# Patient Record
Sex: Female | Born: 1950 | Race: Black or African American | Hispanic: No | Smoking: Never smoker
Health system: Southern US, Community
[De-identification: ages and names within clinical notes are randomized; demographics above are authoritative.]

## PROBLEM LIST (undated history)

## (undated) DIAGNOSIS — I10 Essential (primary) hypertension: Secondary | ICD-10-CM

## (undated) DIAGNOSIS — R079 Chest pain, unspecified: Secondary | ICD-10-CM

## (undated) DIAGNOSIS — Z862 Personal history of diseases of the blood and blood-forming organs and certain disorders involving the immune mechanism: Secondary | ICD-10-CM

## (undated) DIAGNOSIS — J189 Pneumonia, unspecified organism: Secondary | ICD-10-CM

## (undated) DIAGNOSIS — J9601 Acute respiratory failure with hypoxia: Secondary | ICD-10-CM

## (undated) DIAGNOSIS — R001 Bradycardia, unspecified: Secondary | ICD-10-CM

## (undated) DIAGNOSIS — C9590 Leukemia, unspecified not having achieved remission: Secondary | ICD-10-CM

## (undated) DIAGNOSIS — U071 COVID-19: Secondary | ICD-10-CM

## (undated) HISTORY — PX: PARTIAL HYSTERECTOMY: SHX80

---

## 2014-11-15 DIAGNOSIS — D709 Neutropenia, unspecified: Secondary | ICD-10-CM | POA: Insufficient documentation

## 2015-04-08 DIAGNOSIS — M5430 Sciatica, unspecified side: Secondary | ICD-10-CM | POA: Insufficient documentation

## 2015-04-08 DIAGNOSIS — M5137 Other intervertebral disc degeneration, lumbosacral region: Secondary | ICD-10-CM | POA: Insufficient documentation

## 2015-04-08 DIAGNOSIS — D696 Thrombocytopenia, unspecified: Secondary | ICD-10-CM | POA: Insufficient documentation

## 2015-04-08 DIAGNOSIS — E559 Vitamin D deficiency, unspecified: Secondary | ICD-10-CM | POA: Insufficient documentation

## 2015-04-08 DIAGNOSIS — Z862 Personal history of diseases of the blood and blood-forming organs and certain disorders involving the immune mechanism: Secondary | ICD-10-CM | POA: Insufficient documentation

## 2015-04-08 DIAGNOSIS — R7301 Impaired fasting glucose: Secondary | ICD-10-CM | POA: Insufficient documentation

## 2015-04-08 DIAGNOSIS — I10 Essential (primary) hypertension: Secondary | ICD-10-CM | POA: Insufficient documentation

## 2015-04-08 DIAGNOSIS — M51379 Other intervertebral disc degeneration, lumbosacral region without mention of lumbar back pain or lower extremity pain: Secondary | ICD-10-CM | POA: Insufficient documentation

## 2015-04-08 DIAGNOSIS — M47817 Spondylosis without myelopathy or radiculopathy, lumbosacral region: Secondary | ICD-10-CM | POA: Insufficient documentation

## 2015-04-08 DIAGNOSIS — K760 Fatty (change of) liver, not elsewhere classified: Secondary | ICD-10-CM | POA: Insufficient documentation

## 2015-04-28 DIAGNOSIS — G8929 Other chronic pain: Secondary | ICD-10-CM | POA: Insufficient documentation

## 2016-08-19 ENCOUNTER — Encounter (HOSPITAL_COMMUNITY): Payer: Self-pay | Admitting: Emergency Medicine

## 2016-08-19 ENCOUNTER — Emergency Department (HOSPITAL_COMMUNITY)
Admission: EM | Admit: 2016-08-19 | Discharge: 2016-08-19 | Disposition: A | Discharge status: Home / Self Care | Payer: Self-pay | Attending: Emergency Medicine | Admitting: Emergency Medicine

## 2016-08-19 ENCOUNTER — Emergency Department (HOSPITAL_COMMUNITY): Payer: Self-pay

## 2016-08-19 DIAGNOSIS — N3000 Acute cystitis without hematuria: Secondary | ICD-10-CM | POA: Insufficient documentation

## 2016-08-19 DIAGNOSIS — R51 Headache: Secondary | ICD-10-CM

## 2016-08-19 DIAGNOSIS — R300 Dysuria: Secondary | ICD-10-CM | POA: Insufficient documentation

## 2016-08-19 DIAGNOSIS — R519 Headache, unspecified: Secondary | ICD-10-CM

## 2016-08-19 LAB — URINALYSIS, ROUTINE W REFLEX MICROSCOPIC
BACTERIA UA: NONE SEEN
BILIRUBIN URINE: NEGATIVE
Glucose, UA: NEGATIVE mg/dL
Hgb urine dipstick: NEGATIVE
KETONES UR: 5 mg/dL — AB
Nitrite: NEGATIVE
PH: 6 (ref 5.0–8.0)
Protein, ur: 30 mg/dL — AB
Specific Gravity, Urine: 1.024 (ref 1.005–1.030)

## 2016-08-19 LAB — I-STAT CHEM 8, ED
BUN: 11 mg/dL (ref 6–20)
CREATININE: 0.7 mg/dL (ref 0.44–1.00)
Calcium, Ion: 1.04 mmol/L — ABNORMAL LOW (ref 1.15–1.40)
Chloride: 99 mmol/L — ABNORMAL LOW (ref 101–111)
Glucose, Bld: 113 mg/dL — ABNORMAL HIGH (ref 65–99)
HEMATOCRIT: 42 % (ref 36.0–46.0)
Hemoglobin: 14.3 g/dL (ref 12.0–15.0)
Potassium: 3.9 mmol/L (ref 3.5–5.1)
Sodium: 138 mmol/L (ref 135–145)
TCO2: 25 mmol/L (ref 0–100)

## 2016-08-19 MED ORDER — METOCLOPRAMIDE HCL 5 MG/ML IJ SOLN
10.0000 mg | Freq: Once | INTRAMUSCULAR | Status: AC
Start: 2016-08-19 — End: 2016-08-19
  Administered 2016-08-19: 10 mg via INTRAVENOUS
  Filled 2016-08-19: qty 2

## 2016-08-19 MED ORDER — DEXTROSE 5 % IV SOLN
1.0000 g | Freq: Once | INTRAVENOUS | Status: AC
  Administered 2016-08-19: 1 g via INTRAVENOUS
  Filled 2016-08-19: qty 10

## 2016-08-19 MED ORDER — SODIUM CHLORIDE 0.9 % IV BOLUS (SEPSIS)
1000.0000 mL | Freq: Once | INTRAVENOUS | Status: AC
  Administered 2016-08-19: 1000 mL via INTRAVENOUS

## 2016-08-19 MED ORDER — SULFAMETHOXAZOLE-TRIMETHOPRIM 800-160 MG PO TABS: 1.0000 | ORAL_TABLET | Freq: Two times a day (BID) | ORAL | 0 refills | Status: AC

## 2016-08-19 MED ORDER — KETOROLAC TROMETHAMINE 15 MG/ML IJ SOLN
15.0000 mg | Freq: Once | INTRAMUSCULAR | Status: AC
  Administered 2016-08-19: 15 mg via INTRAVENOUS
  Filled 2016-08-19: qty 1

## 2016-08-19 MED ORDER — KETOROLAC TROMETHAMINE 30 MG/ML IJ SOLN: 30.0000 mg | Freq: Once | INTRAMUSCULAR | Status: DC

## 2016-08-19 MED ORDER — DIPHENHYDRAMINE HCL 50 MG/ML IJ SOLN
12.5000 mg | Freq: Once | INTRAMUSCULAR | Status: AC
  Administered 2016-08-19: 12.5 mg via INTRAVENOUS
  Filled 2016-08-19: qty 1

## 2016-08-19 MED ORDER — MORPHINE SULFATE (PF) 4 MG/ML IV SOLN
2.0000 mg | Freq: Once | INTRAVENOUS | Status: AC
  Administered 2016-08-19: 2 mg via INTRAVENOUS
  Filled 2016-08-19: qty 1

## 2016-08-19 NOTE — ED Notes (Addendum)
Pt wants to ambulate to BR before getting medications.  ED Tech helping pt to BR now.  Pt was dizzy upon standing.  This RN drawing up meds to expedite process.

## 2016-08-19 NOTE — ED Notes (Signed)
Pt presents with 3 day hx of headache ("sharp pains"), nausea, weakness.  Pt sts no hx of migraines; her doctor said this was allergies.

## 2016-08-19 NOTE — ED Provider Notes (Signed)
[facility] DEPT Provider Note   CSN: [id number] Arrival date & time: 08/19/16  0405     History   Chief Complaint Chief Complaint  Patient presents with  . Migraine    HPI Brandy Stewart is a 66 y.o. female.  Patient presents with headache that started 3 days ago and described as left side of head that is constant with intermittent sharp shooting pain, "like I'm being stabbed". No photophobia, nausea, vomiting. No aggravating or alleviating factors. She denies history of migraine headache. She also complains of urinary frequency with dysuria. No vaginal discharge, pelvic pain or low back discomfort.    The history is provided by the patient. No language interpreter was used.  Migraine  Associated symptoms include headaches. Pertinent negatives include no chest pain, no abdominal pain and no shortness of breath.    History reviewed. No pertinent past medical history.  There are no active problems to display for this patient.   History reviewed. No pertinent surgical history.  OB History    No data available       Home Medications    Prior to Admission medications   Not on File    Family History No family history on file.  Social History Social History  Substance Use Topics  . Smoking status: Not on file  . Smokeless tobacco: Never Used  . Alcohol use No     Allergies   Penicillins   Review of Systems Review of Systems  Constitutional: Negative for chills and fever.  HENT: Negative.  Negative for congestion and sore throat.   Eyes: Negative for photophobia and visual disturbance.  Respiratory: Negative.  Negative for cough and shortness of breath.   Cardiovascular: Negative.  Negative for chest pain.  Gastrointestinal: Negative.  Negative for abdominal pain, nausea and vomiting.  Genitourinary: Positive for dysuria and frequency. Negative for pelvic pain.  Musculoskeletal: Negative.  Negative for back pain and myalgias.  Skin: Negative.     Neurological: Positive for headaches. Negative for facial asymmetry, speech difficulty, weakness and numbness.     Physical Exam Updated Vital Signs BP 127/63 (BP Location: Right Arm)   Pulse (!) 101   Temp 99.8 F (37.7 C) (Oral)   Resp 16   SpO2 97%   Physical Exam  Constitutional: She is oriented to person, place, and time. She appears well-developed and well-nourished.  HENT:  Head: Normocephalic.  Eyes: Pupils are equal, round, and reactive to light.  Neck: Normal range of motion. Neck supple.  Cardiovascular: Normal rate and regular rhythm.   Pulmonary/Chest: Effort normal and breath sounds normal.  Abdominal: Soft. Bowel sounds are normal. There is no tenderness. There is no rebound and no guarding.  Musculoskeletal: Normal range of motion.  Neurological: She is alert and oriented to person, place, and time. She has normal strength and normal reflexes. No sensory deficit. She displays a negative Romberg sign. Coordination normal.  CN's 3-12 grossly intact. Speech is clear and focused. No facial asymmetry. No lateralizing weakness. Reflexes are equal. No deficits of coordination. Ambulatory without imbalance.    Skin: Skin is warm and dry. No rash noted.  Psychiatric: She has a normal mood and affect.     ED Treatments / Results  Labs (all labs ordered are listed, but only abnormal results are displayed) Labs Reviewed  URINALYSIS, ROUTINE W REFLEX MICROSCOPIC - Abnormal; Notable for the following:       Result Value   APPearance HAZY (*)    Ketones, ur 5 (*)  Protein, ur 30 (*)    Leukocytes, UA LARGE (*)    Squamous Epithelial / LPF 0-5 (*)    All other components within normal limits  I-STAT CHEM 8, ED - Abnormal; Notable for the following:    Chloride 99 (*)    Glucose, Bld 113 (*)    Calcium, Ion 1.04 (*)    All other components within normal limits    EKG  EKG Interpretation None       Radiology No results found.  Procedures Procedures  (including critical care time)  Medications Ordered in ED Medications  metoCLOPramide (REGLAN) injection 10 mg (not administered)  diphenhydrAMINE (BENADRYL) injection 12.5 mg (not administered)  ketorolac (TORADOL) 15 MG/ML injection 15 mg (not administered)     Initial Impression / Assessment and Plan / ED Course  I have reviewed the triage vital signs and the nursing notes.  Pertinent labs & imaging results that were available during my care of the patient were reviewed by me and considered in my medical decision making (see chart for details).     Patient with complaint of headache. Constant dull headache with sharp shooting pain intermittently. Location to left side of head that hasn't changed. Completely normal neurologic exam. Symptoms x 3 days. Doubt intracranial process.   She also complains of urinary frequency and dysuria and is found to have a UTI. Will start on SeptraDS x 5 days. Will provide pyridium.   She will need re-evaluation after medications are provided to determine disposition. Anticipate discharge home.   Patient care signed out to oncoming provider for final disposition.   Final Clinical Impressions(s) / ED Diagnoses   Final diagnoses:  None   1. Nonspecific headache 2. UTI  New Prescriptions New Prescriptions   No medications on file     Charlann Lange, Hershal Coria 08/19/16 0131    Orpah Greek, MD 08/19/16 [phone]

## 2016-08-19 NOTE — ED Triage Notes (Signed)
Patient with three day history of headache.  Patient states that she is having some photophobia and noise sensitivity.  She does have some nausea, no vomiting.  She states she is having some burning with urination.  APAP has not helped her migraine.

## 2016-08-19 NOTE — ED Provider Notes (Signed)
PROGRESS NOTE                                                                                                                 This is a sign-out from [facility] at shift change: Chrishawn Kring is a 66 y.o. female presenting with UTI symptoms and headache onset 3 days ago. Patient does not typically have headaches. She has a normal neurologic exam. She's tried acetaminophen over-the-counter with little relief. No red flags. Because the atypical nature of her headache patient will receive CT. Plan is to DC home with Bactrim for UTI pending. Please refer to previous note for full HPI, ROS, PMH and PE.   Head CT negative.  Patient seen and evaluated the bedside, she states that her headache is improved but is still persistent. LSCTA b/l; Heart is RRR; Abd without TTP, guarding or rebound, MAE, goal oriented speech. Follows commands, Clear, goal oriented speech, Strength is 5 out of 5x4 extremities, patient ambulates with a coordinated in nonantalgic gait. Sensation is grossly intact.  Patient given fluid bolus and Rocephin added on urine culture.  After morphine headache is improved. She will follow closely with primary care which had an extensive discussion of return precautions.       Waynetta Pean 08/19/16 1884    Rolland Porter, MD 08/19/16 2258

## 2016-08-20 ENCOUNTER — Encounter (HOSPITAL_COMMUNITY): Payer: Self-pay | Admitting: Emergency Medicine

## 2016-08-20 ENCOUNTER — Emergency Department (HOSPITAL_COMMUNITY)
Admission: EM | Admit: 2016-08-20 | Discharge: 2016-08-20 | Disposition: A | Discharge status: Home / Self Care | Payer: Self-pay | Attending: Emergency Medicine | Admitting: Emergency Medicine

## 2016-08-20 DIAGNOSIS — R519 Headache, unspecified: Secondary | ICD-10-CM

## 2016-08-20 DIAGNOSIS — N390 Urinary tract infection, site not specified: Secondary | ICD-10-CM | POA: Insufficient documentation

## 2016-08-20 DIAGNOSIS — R509 Fever, unspecified: Secondary | ICD-10-CM | POA: Insufficient documentation

## 2016-08-20 DIAGNOSIS — R51 Headache: Secondary | ICD-10-CM | POA: Insufficient documentation

## 2016-08-20 LAB — CSF CELL COUNT WITH DIFFERENTIAL
RBC COUNT CSF: 0 /mm3
RBC COUNT CSF: 13 /mm3 — AB
TUBE #: 1
TUBE #: 4
WBC, CSF: 7 /mm3 — ABNORMAL HIGH (ref 0–5)
WBC, CSF: 9 /mm3 — ABNORMAL HIGH (ref 0–5)

## 2016-08-20 LAB — URINALYSIS, ROUTINE W REFLEX MICROSCOPIC
BILIRUBIN URINE: NEGATIVE
Glucose, UA: NEGATIVE mg/dL
KETONES UR: 5 mg/dL — AB
NITRITE: NEGATIVE
PROTEIN: 30 mg/dL — AB
Specific Gravity, Urine: 1.008 (ref 1.005–1.030)
pH: 6 (ref 5.0–8.0)

## 2016-08-20 LAB — COMPREHENSIVE METABOLIC PANEL
ALBUMIN: 3.4 g/dL — AB (ref 3.5–5.0)
ALT: 85 U/L — ABNORMAL HIGH (ref 14–54)
ANION GAP: 10 (ref 5–15)
AST: 211 U/L — ABNORMAL HIGH (ref 15–41)
Alkaline Phosphatase: 79 U/L (ref 38–126)
BILIRUBIN TOTAL: 0.4 mg/dL (ref 0.3–1.2)
BUN: 7 mg/dL (ref 6–20)
CO2: 22 mmol/L (ref 22–32)
Calcium: 8.6 mg/dL — ABNORMAL LOW (ref 8.9–10.3)
Chloride: 100 mmol/L — ABNORMAL LOW (ref 101–111)
Creatinine, Ser: 0.83 mg/dL (ref 0.44–1.00)
GFR calc non Af Amer: >60 mL/min (ref >60)
GLUCOSE: 119 mg/dL — AB (ref 65–99)
POTASSIUM: 3.5 mmol/L (ref 3.5–5.1)
SODIUM: 132 mmol/L — AB (ref 135–145)
TOTAL PROTEIN: 8.1 g/dL (ref 6.5–8.1)

## 2016-08-20 LAB — CBC WITH DIFFERENTIAL/PLATELET
BASOS ABS: 0 10*3/uL (ref 0.0–0.1)
BASOS PCT: 0 %
Eosinophils Absolute: 0 10*3/uL (ref 0.0–0.7)
Eosinophils Relative: 0 %
HEMATOCRIT: 38.5 % (ref 36.0–46.0)
HEMOGLOBIN: 12.6 g/dL (ref 12.0–15.0)
Lymphocytes Relative: 17 %
Lymphs Abs: 0.9 10*3/uL (ref 0.7–4.0)
MCH: 29.5 pg (ref 26.0–34.0)
MCHC: 32.7 g/dL (ref 30.0–36.0)
MCV: 90.2 fL (ref 78.0–100.0)
MONO ABS: 0.3 10*3/uL (ref 0.1–1.0)
Monocytes Relative: 6 %
NEUTROS ABS: 3.9 10*3/uL (ref 1.7–7.7)
NEUTROS PCT: 77 %
Platelets: 149 10*3/uL — ABNORMAL LOW (ref 150–400)
RBC: 4.27 MIL/uL (ref 3.87–5.11)
RDW: 12.7 % (ref 11.5–15.5)
WBC: 5.1 10*3/uL (ref 4.0–10.5)

## 2016-08-20 LAB — GLUCOSE, CSF: Glucose, CSF: 65 mg/dL (ref 40–70)

## 2016-08-20 LAB — URINE CULTURE

## 2016-08-20 LAB — I-STAT CG4 LACTIC ACID, ED
Lactic Acid, Venous: 0.81 mmol/L (ref 0.5–1.9)
Lactic Acid, Venous: 0.71 mmol/L (ref 0.5–1.9)

## 2016-08-20 LAB — HIV ANTIBODY (ROUTINE TESTING W REFLEX): HIV SCREEN 4TH GENERATION: NONREACTIVE

## 2016-08-20 LAB — PROTEIN, CSF: TOTAL PROTEIN, CSF: 23 mg/dL (ref 15–45)

## 2016-08-20 MED ORDER — DIPHENHYDRAMINE HCL 50 MG/ML IJ SOLN
25.0000 mg | Freq: Once | INTRAMUSCULAR | Status: AC
  Administered 2016-08-20: 25 mg via INTRAVENOUS
  Filled 2016-08-20: qty 1

## 2016-08-20 MED ORDER — TRAMADOL HCL 50 MG PO TABS: 50.0000 mg | ORAL_TABLET | Freq: Four times a day (QID) | ORAL | 0 refills | Status: DC | PRN

## 2016-08-20 MED ORDER — DEXTROSE 5 % IV SOLN
1.0000 g | Freq: Once | INTRAVENOUS | Status: AC
  Administered 2016-08-20: 1 g via INTRAVENOUS
  Filled 2016-08-20: qty 10

## 2016-08-20 MED ORDER — LIDOCAINE HCL (PF) 1 % IJ SOLN
INTRAMUSCULAR | Status: AC
  Administered 2016-08-20: 30 mL
  Filled 2016-08-20: qty 30

## 2016-08-20 MED ORDER — HYDROMORPHONE HCL 1 MG/ML IJ SOLN
1.0000 mg | Freq: Once | INTRAMUSCULAR | Status: AC
  Administered 2016-08-20: 1 mg via INTRAVENOUS
  Filled 2016-08-20: qty 1

## 2016-08-20 MED ORDER — SODIUM CHLORIDE 0.9 % IV BOLUS (SEPSIS)
500.0000 mL | Freq: Once | INTRAVENOUS | Status: AC
  Administered 2016-08-20: 500 mL via INTRAVENOUS

## 2016-08-20 MED ORDER — PROCHLORPERAZINE EDISYLATE 5 MG/ML IJ SOLN
10.0000 mg | Freq: Once | INTRAMUSCULAR | Status: AC
Start: 2016-08-20 — End: 2016-08-20
  Administered 2016-08-20: 10 mg via INTRAVENOUS
  Filled 2016-08-20: qty 2

## 2016-08-20 MED ORDER — ACETAMINOPHEN 325 MG PO TABS
ORAL_TABLET | ORAL | Status: AC
  Filled 2016-08-20: qty 2

## 2016-08-20 MED ORDER — METOCLOPRAMIDE HCL 5 MG/ML IJ SOLN
10.0000 mg | Freq: Once | INTRAMUSCULAR | Status: AC
  Administered 2016-08-20: 10 mg via INTRAVENOUS
  Filled 2016-08-20: qty 2

## 2016-08-20 MED ORDER — KETOROLAC TROMETHAMINE 30 MG/ML IJ SOLN
15.0000 mg | Freq: Once | INTRAMUSCULAR | Status: AC
  Administered 2016-08-20: 15 mg via INTRAVENOUS
  Filled 2016-08-20: qty 1

## 2016-08-20 MED ORDER — ACETAMINOPHEN 325 MG PO TABS
650.0000 mg | ORAL_TABLET | Freq: Once | ORAL | Status: AC | PRN
  Administered 2016-08-20: 650 mg via ORAL

## 2016-08-20 NOTE — ED Triage Notes (Signed)
Pt to ED from home c/o continued headache on L side (seen and treated here yesterday for same). Today, pt presents with nausea, fever, light sensitivity, and neck pain with movement. Pt reports being sent home with Bactrim yesterday, but her headache was never really treated.

## 2016-08-20 NOTE — ED Provider Notes (Signed)
[facility] DEPT Provider Note   CSN: [id number] Arrival date & time: 08/20/16  0033  By signing my name below, I, Ny'Kea Lewis, attest that this documentation has been prepared under the direction and in the presence of Pollina, Gwenyth Allegra, *. Electronically Signed: Lise Auer, ED Scribe. 08/20/16. 2:31 AM.  History   Chief Complaint Chief Complaint  Patient presents with  . Headache  . Fever   The history is provided by the patient. No language interpreter was used.    HPI Comments: Brandy Stewart is a 66 y.o. female with no pertinent history. who presents to the Emergency Department complaining of intermittent, gradually worsening left sided sharp headache that began yesterday. She notes associated fever, neck pain and stiffness, and nausea. She was seen in the ED yesterday with the complaint of headache but reports the other symptoms are new onsets. Yesterday she was diagnosed with an UTI and given Bactrim. At the time of discharge, there was no change in headache. No treatment tried PTA. She denies symptoms of dysarthria, sore throat, cough, congestion, weakness, numbness, or tingling.   History reviewed. No pertinent past medical history.  There are no active problems to display for this patient.  History reviewed. No pertinent surgical history.  OB History    No data available     Home Medications    Prior to Admission medications   Medication Sig Start Date End Date Taking? Authorizing Provider  acetaminophen (TYLENOL) 500 MG tablet Take 1,000 mg by mouth every 6 (six) hours as needed for mild pain.   Yes [provider]  sulfamethoxazole-trimethoprim (BACTRIM DS,SEPTRA DS) 800-160 MG tablet Take 1 tablet by mouth 2 (two) times daily. 08/19/16 08/24/16 Yes Charlann Lange, PA-C   Family History No family history on file.  Social History Social History  Substance Use Topics  . Smoking status: Never Smoker  . Smokeless tobacco: Never Used  . Alcohol use  No   Allergies   Penicillins  Review of Systems Review of Systems  Constitutional: Positive for fever.  HENT: Negative for congestion and sore throat.        Denies dysarthria.   Respiratory: Negative for cough.   Gastrointestinal: Positive for nausea.  Musculoskeletal: Positive for neck pain and neck stiffness.  Neurological: Positive for headaches. Negative for weakness and numbness.  All other systems reviewed and are negative.  Physical Exam Updated Vital Signs BP 130/62   Pulse 76   Temp (!) 102.9 F (39.4 C) (Oral)   Resp 18   SpO2 99%   Physical Exam  Constitutional: She is oriented to person, place, and time. She appears well-developed and well-nourished.  HENT:  Head: Normocephalic and atraumatic.  Right Ear: External ear normal.  Left Ear: External ear normal.  Nose: Nose normal.  Eyes: Right eye exhibits no discharge. Left eye exhibits no discharge.  Neck:  Decreased ROM with posterior tenderness.   Cardiovascular: Normal rate, regular rhythm and normal heart sounds.   Pulmonary/Chest: Effort normal and breath sounds normal.  Abdominal: Soft. There is no tenderness.  Neurological: She is alert and oriented to person, place, and time.  Skin: Skin is warm and dry.  Nursing note and vitals reviewed.  ED Treatments / Results  DIAGNOSTIC STUDIES: Oxygen Saturation is 98% on RA, normal by my interpretation.   COORDINATION OF CARE: 2:21 AM-Discussed next steps with pt. Pt verbalized understanding and is agreeable with the plan.   Labs (all labs ordered are listed, but only abnormal results are displayed)  Labs Reviewed  COMPREHENSIVE METABOLIC PANEL - Abnormal; Notable for the following:       Result Value   Sodium 132 (*)    Chloride 100 (*)    Glucose, Bld 119 (*)    Calcium 8.6 (*)    Albumin 3.4 (*)    AST 211 (*)    ALT 85 (*)    All other components within normal limits  CBC WITH DIFFERENTIAL/PLATELET - Abnormal; Notable for the following:     Platelets 149 (*)    All other components within normal limits  URINALYSIS, ROUTINE W REFLEX MICROSCOPIC - Abnormal; Notable for the following:    APPearance HAZY (*)    Hgb urine dipstick LARGE (*)    Ketones, ur 5 (*)    Protein, ur 30 (*)    Leukocytes, UA LARGE (*)    Bacteria, UA RARE (*)    Squamous Epithelial / LPF 0-5 (*)    All other components within normal limits  CSF CELL COUNT WITH DIFFERENTIAL - Abnormal; Notable for the following:    RBC Count, CSF 13 (*)    WBC, CSF 9 (*)    All other components within normal limits  CSF CELL COUNT WITH DIFFERENTIAL - Abnormal; Notable for the following:    WBC, CSF 7 (*)    All other components within normal limits  CSF CULTURE  CULTURE, BLOOD (ROUTINE X 2)  CULTURE, BLOOD (ROUTINE X 2)  GLUCOSE, CSF  PROTEIN, CSF  HERPES SIMPLEX VIRUS(HSV) DNA BY PCR  ARBOVIRUS IGG, CSF  HIV ANTIBODY (ROUTINE TESTING)  I-STAT CG4 LACTIC ACID, ED  I-STAT CG4 LACTIC ACID, ED   EKG  EKG Interpretation None      Radiology Ct Head Wo Contrast  Result Date: 08/19/2016 CLINICAL DATA:  66 year old female with headache for 3 days, sensitivity to light and sound. EXAM: CT HEAD WITHOUT CONTRAST TECHNIQUE: Contiguous axial images were obtained from the base of the skull through the vertex without intravenous contrast. COMPARISON:  [facility] neck CT 07/08/2009. FINDINGS: Brain: Cerebral volume is within normal limits for age. No midline shift, ventriculomegaly, mass effect, evidence of mass lesion, intracranial hemorrhage or evidence of cortically based acute infarction. Gray-white matter differentiation is within normal limits throughout the brain. No encephalomalacia identified. Vascular: Calcified atherosclerosis at the skull base. No suspicious intracranial vascular hyperdensity. Skull: No acute osseous abnormality identified. Sinuses/Orbits: Unchanged small left maxillary sinus mucous retention cyst. Otherwise clear. Bilateral  tympanic cavities and mastoids are clear. Other: Visualized orbit soft tissues are within normal limits. Visualized scalp soft tissues are within normal limits. IMPRESSION: Normal for age non contrast CT appearance of the brain. Electronically Signed   By: Genevie Ann M.D.   On: 08/19/2016 07:32    Procedures .Lumbar Puncture Date/Time: 08/20/2016 6:42 AM Performed by: Orpah Greek Authorized by: Orpah Greek   Consent:    Consent obtained:  Verbal   Consent given by:  Patient   Risks discussed:  Headache, pain and repeat procedure Universal protocol:    Procedure explained and questions answered to patient or proxy's satisfaction: yes     Relevant documents present and verified: yes     Test results available and properly labeled: yes     Imaging studies available: yes     Required blood products, implants, devices, and special equipment available: yes     Immediately prior to procedure a time out was called: yes     Site/side marked: yes  Patient identity confirmed:  Verbally with patient and hospital-assigned identification number Pre-procedure details:    Procedure purpose:  Diagnostic   Preparation: Patient was prepped and draped in usual sterile fashion   Anesthesia (see MAR for exact dosages):    Anesthesia method:  Local infiltration   Local anesthetic:  Lidocaine 1% w/o epi Procedure details:    Lumbar space:  L3-L4 interspace   Patient position:  Sitting   Needle gauge:  20   Needle type:  Spinal needle - Quincke tip   Needle length (in):  3.5   Number of attempts:  2   Opening pressure (cm H2O):  29 (seated position)   Fluid appearance:  Clear   Tubes of fluid:  4   Total volume (ml):  8 Post-procedure:    Puncture site:  Adhesive bandage applied   Patient tolerance of procedure:  Tolerated well, no immediate complications Comments:     Initially attempted left lateral decubitus position but was unable to find interspace, stopped procedure and  restarted in seated position with no difficulty.   (including critical care time)  Medications Ordered in ED Medications  cefTRIAXone (ROCEPHIN) 1 g in dextrose 5 % 50 mL IVPB (not administered)  acetaminophen (TYLENOL) tablet 650 mg (650 mg Oral Given 08/20/16 0046)  sodium chloride 0.9 % bolus 500 mL (0 mLs Intravenous Stopped 08/20/16 0354)  HYDROmorphone (DILAUDID) injection 1 mg (1 mg Intravenous Given 08/20/16 0305)  metoCLOPramide (REGLAN) injection 10 mg (10 mg Intravenous Given 08/20/16 0305)  lidocaine (PF) (XYLOCAINE) 1 % injection (30 mLs  Given by Other 08/20/16 0354)  prochlorperazine (COMPAZINE) injection 10 mg (10 mg Intravenous Given 08/20/16 0654)  diphenhydrAMINE (BENADRYL) injection 25 mg (25 mg Intravenous Given 08/20/16 0654)  ketorolac (TORADOL) 30 MG/ML injection 15 mg (15 mg Intravenous Given 08/20/16 0654)   Initial Impression / Assessment and Plan / ED Course  I have reviewed the triage vital signs and the nursing notes.  Pertinent labs & imaging results that were available during my care of the patient were reviewed by me and considered in my medical decision making (see chart for details).     Patient presents to the emergency department for persistent headache. Patient seen yesterday with headache, had a normal CT scan. She was diagnosed with urinary tract infection, sent home on Bactrim. Patient experiencing increasing left-sided headache tonight. She is noted to have fever in triage. She complains of neck stiffness. Examination did reveal some tenderness of the soft tissues but no obvious meningismus signs. Negative Kernig, negative Brudzinski. Because of her persistent headache symptoms, fever and neck pain, I recommended lumbar puncture. After discussing risks and benefits with her and her husband consent was given. Procedure was performed. Patient has actually no signs of meningitis on CSF evaluation. Gram stain revealed no organisms. Protein and glucose normal. No  significant white or red cells. Patient reassured, fever unrelated to headache, or simply secondary to headache without any meningitis, including viral.   Urinalysis still shows too numerous to count white blood cells. Urine culture from yesterday is still pending. Will repeat Rocephin dose, continue Bactrim. Follow culture. Patient will not require hospitalization or further intervention at this time.  Final Clinical Impressions(s) / ED Diagnoses   Final diagnoses:  Bad headache  Fever, unspecified fever cause  Urinary tract infection without hematuria, site unspecified    New Prescriptions New Prescriptions   No medications on file  I personally performed the services described in this documentation, which was scribed in  my presence. The recorded information has been reviewed and is accurate.    Orpah Greek, MD 08/20/16 [phone]

## 2016-08-21 LAB — HERPES SIMPLEX VIRUS(HSV) DNA BY PCR
HSV 1 DNA: NEGATIVE
HSV 2 DNA: NEGATIVE

## 2016-08-23 ENCOUNTER — Encounter (HOSPITAL_COMMUNITY): Payer: Self-pay | Admitting: Emergency Medicine

## 2016-08-23 ENCOUNTER — Emergency Department (HOSPITAL_COMMUNITY)
Admission: EM | Admit: 2016-08-23 | Discharge: 2016-08-23 | Disposition: A | Discharge status: Home / Self Care | Payer: Self-pay | Attending: Emergency Medicine | Admitting: Emergency Medicine

## 2016-08-23 ENCOUNTER — Emergency Department (HOSPITAL_COMMUNITY): Payer: Self-pay

## 2016-08-23 DIAGNOSIS — Z880 Allergy status to penicillin: Secondary | ICD-10-CM | POA: Insufficient documentation

## 2016-08-23 DIAGNOSIS — N3000 Acute cystitis without hematuria: Secondary | ICD-10-CM | POA: Insufficient documentation

## 2016-08-23 DIAGNOSIS — B084 Enteroviral vesicular stomatitis with exanthem: Secondary | ICD-10-CM | POA: Insufficient documentation

## 2016-08-23 DIAGNOSIS — R197 Diarrhea, unspecified: Secondary | ICD-10-CM | POA: Insufficient documentation

## 2016-08-23 DIAGNOSIS — E860 Dehydration: Secondary | ICD-10-CM | POA: Insufficient documentation

## 2016-08-23 DIAGNOSIS — K2900 Acute gastritis without bleeding: Secondary | ICD-10-CM | POA: Insufficient documentation

## 2016-08-23 DIAGNOSIS — Z79899 Other long term (current) drug therapy: Secondary | ICD-10-CM | POA: Insufficient documentation

## 2016-08-23 LAB — CBC
HEMATOCRIT: 39.7 % (ref 36.0–46.0)
Hemoglobin: 13.2 g/dL (ref 12.0–15.0)
MCH: 29.5 pg (ref 26.0–34.0)
MCHC: 33.2 g/dL (ref 30.0–36.0)
MCV: 88.6 fL (ref 78.0–100.0)
PLATELETS: 156 10*3/uL (ref 150–400)
RBC: 4.48 MIL/uL (ref 3.87–5.11)
RDW: 12.7 % (ref 11.5–15.5)
WBC: 4.4 10*3/uL (ref 4.0–10.5)

## 2016-08-23 LAB — COMPREHENSIVE METABOLIC PANEL
ALBUMIN: 3.2 g/dL — AB (ref 3.5–5.0)
ALT: 73 U/L — AB (ref 14–54)
AST: 101 U/L — AB (ref 15–41)
Alkaline Phosphatase: 84 U/L (ref 38–126)
Anion gap: 10 (ref 5–15)
BUN: 9 mg/dL (ref 6–20)
CHLORIDE: 102 mmol/L (ref 101–111)
CO2: 21 mmol/L — AB (ref 22–32)
Calcium: 8.8 mg/dL — ABNORMAL LOW (ref 8.9–10.3)
Creatinine, Ser: 0.96 mg/dL (ref 0.44–1.00)
GFR calc Af Amer: >60 mL/min (ref >60)
GLUCOSE: 114 mg/dL — AB (ref 65–99)
Potassium: 3.8 mmol/L (ref 3.5–5.1)
Sodium: 133 mmol/L — ABNORMAL LOW (ref 135–145)
Total Bilirubin: 0.7 mg/dL (ref 0.3–1.2)
Total Protein: 7.5 g/dL (ref 6.5–8.1)

## 2016-08-23 LAB — URINALYSIS, ROUTINE W REFLEX MICROSCOPIC
Bilirubin Urine: NEGATIVE
Glucose, UA: NEGATIVE mg/dL
Ketones, ur: 20 mg/dL — AB
Nitrite: NEGATIVE
PROTEIN: 100 mg/dL — AB
SPECIFIC GRAVITY, URINE: 1.025 (ref 1.005–1.030)
pH: 6 (ref 5.0–8.0)

## 2016-08-23 LAB — LIPASE, BLOOD: LIPASE: 41 U/L (ref 11–51)

## 2016-08-23 MED ORDER — ONDANSETRON HCL 4 MG/2ML IJ SOLN
4.0000 mg | Freq: Once | INTRAMUSCULAR | Status: AC
Start: 2016-08-23 — End: 2016-08-23
  Administered 2016-08-23: 4 mg via INTRAVENOUS
  Filled 2016-08-23: qty 2

## 2016-08-23 MED ORDER — SODIUM CHLORIDE 0.9 % IV BOLUS (SEPSIS)
1000.0000 mL | Freq: Once | INTRAVENOUS | Status: AC
  Administered 2016-08-23: 1000 mL via INTRAVENOUS

## 2016-08-23 MED ORDER — LEVOFLOXACIN 500 MG PO TABS
500.0000 mg | ORAL_TABLET | Freq: Once | ORAL | Status: AC
  Administered 2016-08-23: 500 mg via ORAL
  Filled 2016-08-23: qty 1

## 2016-08-23 MED ORDER — RANITIDINE HCL 150 MG PO TABS
150.0000 mg | ORAL_TABLET | Freq: Two times a day (BID) | ORAL | 0 refills | Status: DC
Start: 2016-08-23 — End: 2016-10-21

## 2016-08-23 MED ORDER — ONDANSETRON 4 MG PO TBDP
8.0000 mg | ORAL_TABLET | Freq: Once | ORAL | Status: AC
  Administered 2016-08-23: 8 mg via ORAL

## 2016-08-23 MED ORDER — ONDANSETRON 4 MG PO TBDP: ORAL_TABLET | ORAL | 0 refills | Status: DC

## 2016-08-23 MED ORDER — LEVOFLOXACIN 500 MG PO TABS: 500.0000 mg | ORAL_TABLET | Freq: Every day | ORAL | 0 refills | Status: DC

## 2016-08-23 MED ORDER — ONDANSETRON 4 MG PO TBDP
ORAL_TABLET | ORAL | Status: AC
  Administered 2016-08-23: 8 mg via ORAL
  Filled 2016-08-23: qty 2

## 2016-08-23 MED ORDER — IOPAMIDOL (ISOVUE-300) INJECTION 61%
INTRAVENOUS | Status: AC
  Administered 2016-08-23: 75 mL via INTRAVENOUS
  Filled 2016-08-23: qty 100

## 2016-08-23 MED ORDER — PANTOPRAZOLE SODIUM 40 MG IV SOLR
40.0000 mg | Freq: Once | INTRAVENOUS | Status: AC
  Administered 2016-08-23: 40 mg via INTRAVENOUS
  Filled 2016-08-23: qty 40

## 2016-08-23 NOTE — ED Notes (Signed)
This RN attempted Iv stick x 1 unsuccessfully

## 2016-08-23 NOTE — ED Triage Notes (Signed)
Patient reports multiple emesis with diarrhea and sores inside her mouth / inner cheek for several days , pt. suspects side effects from her antibiotic Bactrim DS for yeast infection .Denies fever or chills / no abdominal pain .

## 2016-08-23 NOTE — ED Provider Notes (Signed)
[facility] DEPT Provider Note   CSN: [id number] Arrival date & time: 08/23/16  0128     History   Chief Complaint Chief Complaint  Patient presents with  . Emesis  . Diarrhea  . Stomatitis    HPI Brandy Stewart is a 66 y.o. female.  Patient complains of epigastric discomfort with vomiting and diarrhea.   The history is provided by the patient. No language interpreter was used.  Emesis   This is a new problem. The current episode started 12 to 24 hours ago. The problem occurs 2 to 4 times per day. The problem has not changed since onset.The emesis has an appearance of stomach contents. There has been no fever. Associated symptoms include abdominal pain. Pertinent negatives include no cough, no diarrhea and no headaches.  Abdominal Pain   This is a new problem. The current episode started 12 to 24 hours ago. The problem occurs constantly. The problem has not changed since onset.The pain is associated with an unknown factor. The pain is located in the epigastric region. The quality of the pain is aching. Pertinent negatives include diarrhea, frequency, hematuria and headaches.    History reviewed. No pertinent past medical history.  There are no active problems to display for this patient.   Past Surgical History:  Procedure Laterality Date  . PARTIAL HYSTERECTOMY      OB History    No data available       Home Medications    Prior to Admission medications   Medication Sig Start Date End Date Taking? Authorizing Provider  sulfamethoxazole-trimethoprim (BACTRIM DS,SEPTRA DS) 800-160 MG tablet Take 1 tablet by mouth 2 (two) times daily. 08/19/16 08/24/16 Yes Upstill, Nehemiah Settle, PA-C  acetaminophen (TYLENOL) 500 MG tablet Take 1,000 mg by mouth every 6 (six) hours as needed for mild pain.    [provider]  levofloxacin (LEVAQUIN) 500 MG tablet Take 1 tablet (500 mg total) by mouth daily. 08/23/16   Milton Ferguson, MD  ondansetron (ZOFRAN ODT) 4 MG disintegrating  tablet 4mg  ODT q4 hours prn nausea/vomit 08/23/16   Milton Ferguson, MD  ranitidine (ZANTAC) 150 MG tablet Take 1 tablet (150 mg total) by mouth 2 (two) times daily. 08/23/16   Milton Ferguson, MD  traMADol (ULTRAM) 50 MG tablet Take 1 tablet (50 mg total) by mouth every 6 (six) hours as needed. Patient not taking: Reported on 08/23/2016 08/20/16   Orpah Greek, MD    Family History No family history on file.  Social History Social History  Substance Use Topics  . Smoking status: Never Smoker  . Smokeless tobacco: Never Used  . Alcohol use No     Allergies   Penicillins   Review of Systems Review of Systems  Constitutional: Negative for appetite change and fatigue.  HENT: Negative for congestion, ear discharge and sinus pressure.   Eyes: Negative for discharge.  Respiratory: Negative for cough.   Cardiovascular: Negative for chest pain.  Gastrointestinal: Positive for abdominal pain. Negative for diarrhea.  Genitourinary: Negative for frequency and hematuria.  Musculoskeletal: Negative for back pain.  Skin: Negative for rash.  Neurological: Negative for seizures and headaches.  Psychiatric/Behavioral: Negative for hallucinations.     Physical Exam Updated Vital Signs BP (!) 112/53 (BP Location: Right Arm)   Pulse 82   Temp 98.6 F (37 C) (Oral)   Resp 16   Ht 5\' 6"  (1.676 m)   Wt 90.7 kg (200 lb)   SpO2 99%   BMI 32.28 kg/m  Physical Exam  Constitutional: She is oriented to person, place, and time. She appears well-developed.  HENT:  Head: Normocephalic.  Eyes: Conjunctivae and EOM are normal. No scleral icterus.  Neck: Neck supple. No thyromegaly present.  Cardiovascular: Normal rate and regular rhythm.  Exam reveals no gallop and no friction rub.   No murmur heard. Pulmonary/Chest: No stridor. She has no wheezes. She has no rales. She exhibits no tenderness.  Abdominal: She exhibits no distension. There is tenderness. There is no rebound.  Tender  epigastric  Genitourinary:  Genitourinary Comments: Patient has a small ulcer on the labia majora. She will get a herpes culture sent  Musculoskeletal: Normal range of motion. She exhibits no edema.  Lymphadenopathy:    She has no cervical adenopathy.  Neurological: She is oriented to person, place, and time. She exhibits normal muscle tone. Coordination normal.  Skin: No rash noted. No erythema.  Psychiatric: She has a normal mood and affect. Her behavior is normal.     ED Treatments / Results  Labs (all labs ordered are listed, but only abnormal results are displayed) Labs Reviewed  COMPREHENSIVE METABOLIC PANEL - Abnormal; Notable for the following:       Result Value   Sodium 133 (*)    CO2 21 (*)    Glucose, Bld 114 (*)    Calcium 8.8 (*)    Albumin 3.2 (*)    AST 101 (*)    ALT 73 (*)    All other components within normal limits  URINALYSIS, ROUTINE W REFLEX MICROSCOPIC - Abnormal; Notable for the following:    Color, Urine AMBER (*)    APPearance HAZY (*)    Hgb urine dipstick SMALL (*)    Ketones, ur 20 (*)    Protein, ur 100 (*)    Leukocytes, UA SMALL (*)    Bacteria, UA MANY (*)    Squamous Epithelial / LPF 0-5 (*)    All other components within normal limits  HSV CULTURE AND TYPING  URINE CULTURE  LIPASE, BLOOD  CBC    EKG  EKG Interpretation None       Radiology Ct Abdomen Pelvis W Contrast  Result Date: 08/23/2016 CLINICAL DATA:  Pt c/o peri-umbilical pain, n/v/d x 1 week; pt unable to keep even liquids down; EXAM: CT ABDOMEN AND PELVIS WITH CONTRAST TECHNIQUE: Multidetector CT imaging of the abdomen and pelvis was performed using the standard protocol following bolus administration of intravenous contrast. CONTRAST:  75 mL ISOVUE-300 IOPAMIDOL (ISOVUE-300) INJECTION 61% COMPARISON:  07/17/2009 FINDINGS: Lower chest: No acute abnormality. Hepatobiliary: No focal liver abnormality is seen. No gallstones, gallbladder wall thickening, or biliary  dilatation. Pancreas: Unremarkable. No pancreatic ductal dilatation or surrounding inflammatory changes. Spleen: Normal in size without focal abnormality. Adrenals/Urinary Tract: Adrenal glands are unremarkable. Kidneys are normal, without renal calculi, focal lesion, or hydronephrosis. Bladder is unremarkable. Stomach/Bowel: Stomach is within normal limits. Appendix not identified. No evidence of bowel wall thickening, distention, or inflammatory changes. Vascular/Lymphatic: Mild scattered aortoiliac arterial calcifications without aneurysm or stenosis. Stable subcentimeter bilateral iliac and epigastric lymph nodes since 2011. No abdominal or pelvic lymphadenopathy localized. Bilateral pelvic phleboliths. Reproductive: Status post hysterectomy. No adnexal masses. Other: No ascites.  No free air. Musculoskeletal: Mild facet DJD in the lower lumbar spine most marked L4-5. Negative for fracture or worrisome bone lesion. IMPRESSION: 1. No acute findings. 2. 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 Electronically Signed   By: Lucrezia Europe M.D.   On: 08/23/2016 10:49  Procedures Procedures (including critical care time)  Medications Ordered in ED Medications  levofloxacin (LEVAQUIN) tablet 500 mg (not administered)  ondansetron (ZOFRAN-ODT) disintegrating tablet 8 mg (8 mg Oral Given 08/23/16 0204)  sodium chloride 0.9 % bolus 1,000 mL (1,000 mLs Intravenous New Bag/Given 08/23/16 1050)  ondansetron (ZOFRAN) injection 4 mg (4 mg Intravenous Given 08/23/16 1049)  pantoprazole (PROTONIX) injection 40 mg (40 mg Intravenous Given 08/23/16 1046)  iopamidol (ISOVUE-300) 61 % injection (75 mLs Intravenous Contrast Given 08/23/16 1036)     Initial Impression / Assessment and Plan / ED Course  I have reviewed the triage vital signs and the nursing notes.  Pertinent labs & imaging results that were available during my care of the patient were reviewed by me and considered in my medical decision making (see chart for  details).     Patient with urinary tract infection we will culture her urine put on Levaquin. Also a herpes culture was sent out. CT scan was negative patient will be treated for gastritis  Final Clinical Impressions(s) / ED Diagnoses   Final diagnoses:  Dehydration  Acute cystitis without hematuria  Acute superficial gastritis without hemorrhage    New Prescriptions New Prescriptions   LEVOFLOXACIN (LEVAQUIN) 500 MG TABLET    Take 1 tablet (500 mg total) by mouth daily.   ONDANSETRON (ZOFRAN ODT) 4 MG DISINTEGRATING TABLET    4mg  ODT q4 hours prn nausea/vomit   RANITIDINE (ZANTAC) 150 MG TABLET    Take 1 tablet (150 mg total) by mouth 2 (two) times daily.     Milton Ferguson, MD 08/23/16 [phone]

## 2016-08-23 NOTE — ED Notes (Signed)
Patient aware that a urine sample is needed 

## 2016-08-23 NOTE — Discharge Instructions (Signed)
Take your tramadol for pain and follow-up with the family doctor or at the [facility] clinic for recheck in a week

## 2016-08-23 NOTE — ED Notes (Signed)
Iv attempted x1 unsuccessful

## 2016-08-24 LAB — URINE CULTURE: Culture: <10000 — AB

## 2016-08-25 LAB — HSV CULTURE AND TYPING

## 2016-08-25 LAB — CULTURE, BLOOD (ROUTINE X 2)
CULTURE: NO GROWTH
Culture: NO GROWTH
Special Requests: ADEQUATE
Special Requests: ADEQUATE

## 2016-08-25 LAB — CSF CULTURE: CULTURE: NO GROWTH

## 2016-08-25 LAB — CSF CULTURE W GRAM STAIN

## 2016-08-26 ENCOUNTER — Encounter (HOSPITAL_COMMUNITY): Payer: Self-pay

## 2016-08-26 ENCOUNTER — Emergency Department (HOSPITAL_COMMUNITY)
Admission: EM | Admit: 2016-08-26 | Discharge: 2016-08-26 | Disposition: A | Discharge status: Home / Self Care | Payer: Self-pay | Attending: Emergency Medicine | Admitting: Emergency Medicine

## 2016-08-26 DIAGNOSIS — B370 Candidal stomatitis: Secondary | ICD-10-CM

## 2016-08-26 DIAGNOSIS — B379 Candidiasis, unspecified: Secondary | ICD-10-CM | POA: Insufficient documentation

## 2016-08-26 DIAGNOSIS — B009 Herpesviral infection, unspecified: Secondary | ICD-10-CM | POA: Insufficient documentation

## 2016-08-26 LAB — CBC WITH DIFFERENTIAL/PLATELET
BASOS ABS: 0 10*3/uL (ref 0.0–0.1)
BLASTS: 0 %
Band Neutrophils: 0 %
Basophils Relative: 0 %
Eosinophils Absolute: 0 10*3/uL (ref 0.0–0.7)
Eosinophils Relative: 0 %
HEMATOCRIT: 39.5 % (ref 36.0–46.0)
HEMOGLOBIN: 13.4 g/dL (ref 12.0–15.0)
Lymphocytes Relative: 43 %
Lymphs Abs: 3.4 10*3/uL (ref 0.7–4.0)
MCH: 30.2 pg (ref 26.0–34.0)
MCHC: 33.9 g/dL (ref 30.0–36.0)
MCV: 89 fL (ref 78.0–100.0)
METAMYELOCYTES PCT: 0 %
MONOS PCT: 12 %
MYELOCYTES: 0 %
Monocytes Absolute: 0.9 10*3/uL (ref 0.1–1.0)
Neutro Abs: 3.5 10*3/uL (ref 1.7–7.7)
Neutrophils Relative %: 45 %
Other: 0 %
PROMYELOCYTES ABS: 0 %
Platelets: 346 10*3/uL (ref 150–400)
RBC: 4.44 MIL/uL (ref 3.87–5.11)
RDW: 13.1 % (ref 11.5–15.5)
WBC: 7.8 10*3/uL (ref 4.0–10.5)
nRBC: 0 /100 WBC

## 2016-08-26 LAB — ARBOVIRUS IGG, CSF: West Nile IgG CSF: 1.23 IV (ref <1.30)

## 2016-08-26 MED ORDER — ACYCLOVIR 400 MG PO TABS
400.0000 mg | ORAL_TABLET | Freq: Once | ORAL | Status: AC
  Administered 2016-08-26: 400 mg via ORAL
  Filled 2016-08-26: qty 1

## 2016-08-26 MED ORDER — NYSTATIN 100000 UNIT/ML MT SUSP
5.0000 mL | Freq: Once | OROMUCOSAL | Status: AC
  Administered 2016-08-26: 500000 [IU] via ORAL
  Filled 2016-08-26: qty 5

## 2016-08-26 MED ORDER — NYSTATIN 100000 UNIT/ML MT SUSP: 500000.0000 [IU] | Freq: Four times a day (QID) | OROMUCOSAL | 0 refills | Status: DC

## 2016-08-26 MED ORDER — ACYCLOVIR 400 MG PO TABS: 400.0000 mg | ORAL_TABLET | Freq: Four times a day (QID) | ORAL | 0 refills | Status: DC

## 2016-08-26 NOTE — ED Provider Notes (Signed)
MSE was initiated and I personally evaluated the patient and placed orders (if any) at  3:49 PM on August 26, 2016.  Pt came to ED because she tested positive for herpes from culture taken at last visit.  Notes she has no insurance and no money and cannot afford treatment.  Also notes she has persistent abdominal pain with associated nausea, vomiting. Dry mouth. Decreased PO intake.  She is feeling weak.  Labs ordered.  Pt was triaged to fast track room with chair.  She will need to be moved to regular ED room with stretcher for full examination.  The patient appears stable so that the remainder of the MSE may be completed by another provider.   Clayton Bibles, PA-C 08/26/16 1551    Margette Fast, MD 08/26/16 [phone]

## 2016-08-26 NOTE — ED Provider Notes (Signed)
[facility] DEPT Provider Note   CSN: [id number] Arrival date & time: 08/26/16  1446  By signing my name below, I, Reola Mosher, attest that this documentation has been prepared under the direction and in the presence of [employer], PA-C.  Electronically Signed: Reola Mosher, ED Scribe. 08/26/16. 5:07 PM.  History   Chief Complaint Chief Complaint  Patient presents with  . Follow-up   The history is provided by the patient. No language interpreter was used.    HPI Comments:  Brandy Stewart is a 66 y.o. female who presents to the Emergency Department for f/u d/t persistent mouth sores beginning approximately one week ago. 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 This was run from a culture test on 08/23/16 while she was in the ED; she was seen for abdominal pain and she was dx'd w/ gastritis at that time. CT a/p was negative at that time. No h/o similar mouth sores. She was also given Levaquin for a UTI at from her last visit which she has been taking; her mouth sores were present prior to her beginning the abx. Pt reports that she also does not currently have insurance or the financial means to be able to pay for her medications to treat this and is requesting assistance as well. She also reports some mild nausea, vomiting, and diarrhea throughout the past several days. Denies fever, or any other associated symptoms.   History reviewed. No pertinent past medical history.  There are no active problems to display for this patient.  Past Surgical History:  Procedure Laterality Date  . PARTIAL HYSTERECTOMY     OB History    No data available     Home Medications    Prior to Admission medications   Medication Sig Start Date End Date Taking? Authorizing Provider  acetaminophen (TYLENOL) 500 MG tablet Take 1,000 mg by mouth every 6 (six) hours as needed for mild pain.    [provider]  acyclovir (ZOVIRAX) 400 MG tablet Take 1 tablet (400 mg total) by mouth 4 (four) times daily. 08/26/16   Jermie Hippe, Dellis Filbert, PA-C  levofloxacin (LEVAQUIN) 500 MG tablet Take 1 tablet (500 mg total) by mouth daily. 08/23/16   Milton Ferguson, MD  methocarbamol (ROBAXIN) 500 MG tablet Take 1 tablet (500 mg total) by mouth 3 (three) times daily. 09/08/16   Clent Demark, PA-C  nystatin (MYCOSTATIN) 100000 UNIT/ML suspension Take 5 mLs (500,000 Units total) by mouth 4 (four) times daily. 08/26/16   Rhylan Gross, Dellis Filbert, PA-C  ondansetron (ZOFRAN ODT) 4 MG disintegrating tablet 4mg  ODT q4 hours prn nausea/vomit Patient not taking: Reported on 09/01/2016 08/23/16   Milton Ferguson, MD  phenazopyridine (PYRIDIUM) 200 MG tablet Take 1 tablet (200 mg total) by mouth 3 (three) times daily as needed for pain. 09/01/16   Clent Demark, PA-C  ranitidine (ZANTAC) 150 MG tablet Take 1 tablet (150 mg total) by mouth 2 (two) times daily. 08/23/16   Milton Ferguson, MD  traMADol (ULTRAM) 50 MG tablet Take 1 tablet (50 mg total) by mouth every 6 (six) hours as needed. Patient not taking: Reported on 08/23/2016 08/20/16   Orpah Greek, MD  vitamin B-12 (CYANOCOBALAMIN) 50 MCG tablet Take 1 tablet (50 mcg total) by mouth daily. 09/01/16   Clent Demark, PA-C   Family History No family history on file.  Social History Social History  Substance Use Topics  .  Smoking status: Never Smoker  . Smokeless tobacco: Never Used  . Alcohol use No   Allergies   Penicillins  Review of Systems Review of Systems  Constitutional: Negative for fever.  HENT: Positive for mouth sores.   Gastrointestinal: Positive for diarrhea, nausea and vomiting.  Skin: Positive for rash.  All other systems reviewed and are negative.   Physical Exam Updated Vital Signs BP 130/68   Pulse 73   Temp 98.5 F (36.9 C) (Oral)   Resp 16   Ht 5\' 4"  (1.626 m)   Wt 90.7 kg (200 lb)   SpO2 100%   BMI 34.33 kg/m   Physical  Exam  Constitutional: She appears well-developed and well-nourished. No distress.  HENT:  Head: Normocephalic and atraumatic.  Thrush to the OP.   Eyes: Conjunctivae are normal.  Neck: Normal range of motion.  Cardiovascular: Normal rate.   Pulmonary/Chest: Effort normal.  Abdominal: Soft. She exhibits no distension. There is no tenderness.  Abdomen very minimally tender to palpation diffusely.   Musculoskeletal: Normal range of motion.  Neurological: She is alert.  Skin: No pallor.  Psychiatric: She has a normal mood and affect. Her behavior is normal.  Nursing note and vitals reviewed.  ED Treatments / Results  DIAGNOSTIC STUDIES: Oxygen Saturation is 96% on RA, normal by my interpretation.   COORDINATION OF CARE: 5:26 PM-Discussed next steps with pt. Pt verbalized understanding and is agreeable with the plan.   Labs (all labs ordered are listed, but only abnormal results are displayed) Labs Reviewed  RPR - Abnormal; Notable for the following:       Result Value   RPR Ser Ql Reactive (*)    All other components within normal limits  RPR, QUANT+TP ABS (REFLEX) - Abnormal; Notable for the following:    Rapid Plasma Reagin, Quant 1:2 (*)    All other components within normal limits  CBC WITH DIFFERENTIAL/PLATELET  HIV ANTIBODY (ROUTINE TESTING)   EKG  EKG Interpretation None      Radiology No results found.  Procedures Procedures   Medications Ordered in ED Medications  acyclovir (ZOVIRAX) tablet 400 mg (400 mg Oral Given 08/26/16 2038)  nystatin (MYCOSTATIN) 100000 UNIT/ML suspension 500,000 Units (500,000 Units Oral Given 08/26/16 2039)    Initial Impression / Assessment and Plan / ED Course  I have reviewed the triage vital signs and the nursing notes.  Pertinent labs & imaging results that were available during my care of the patient were reviewed by me and considered in my medical decision making (see chart for details).      Final Clinical  Impressions(s) / ED Diagnoses   Final diagnoses:  Thrush  Herpes    66 year old female presents today for follow-up for positive HSV.  She will be started on acyclovir.  Patient also has what appears to be thrush, she will be started on nystatin.  Patient will follow up as an outpatient with her primary care for ongoing evaluation and management.  Patient discharged home with strict return precautions and follow-up information.  She verbalized understanding and agreement to this plan had no further questions or concerns the time discharge.  New Prescriptions Discharge Medication List as of 08/26/2016  7:49 PM    START taking these medications   Details  acyclovir (ZOVIRAX) 400 MG tablet Take 1 tablet (400 mg total) by mouth 4 (four) times daily., Starting Thu 08/26/2016, Print    nystatin (MYCOSTATIN) 100000 UNIT/ML suspension Take 5 mLs (500,000 Units total) by mouth  4 (four) times daily., Starting Thu 08/26/2016, Print       I personally performed the services described in this documentation, which was scribed in my presence. The recorded information has been reviewed and is accurate.    Okey Regal, PA-C 09/13/16 1742    Okey Regal, PA-C 09/13/16 2004    Margette Fast, MD 09/15/16 [phone]

## 2016-08-26 NOTE — ED Notes (Signed)
Patient states she is too weak to walk and requests to stay in wheelchair.

## 2016-08-26 NOTE — Discharge Instructions (Signed)
Please read attached information. If you experience any new or worsening signs or symptoms please return to the emergency room for evaluation. Please follow-up with your primary care provider or specialist as discussed. Please use medication prescribed only as directed and discontinue taking if you have any concerning signs or symptoms.   °

## 2016-08-26 NOTE — ED Triage Notes (Signed)
PT received phone call from [facility] with results for positive herpes culture. Pt is unable to afford medication and was told to come back here.

## 2016-08-26 NOTE — ED Notes (Signed)
Awaiting meds from main pharmacy.

## 2016-08-27 LAB — RPR, QUANT+TP ABS (REFLEX)
Rapid Plasma Reagin, Quant: 1:2 {titer} — ABNORMAL HIGH
T Pallidum Abs: NEGATIVE

## 2016-08-27 LAB — HIV ANTIBODY (ROUTINE TESTING W REFLEX): HIV Screen 4th Generation wRfx: NONREACTIVE

## 2016-08-27 LAB — RPR: RPR Ser Ql: REACTIVE — AB

## 2016-09-01 ENCOUNTER — Ambulatory Visit (INDEPENDENT_AMBULATORY_CARE_PROVIDER_SITE_OTHER): Payer: Self-pay | Admitting: Physician Assistant

## 2016-09-01 ENCOUNTER — Encounter (INDEPENDENT_AMBULATORY_CARE_PROVIDER_SITE_OTHER): Payer: Self-pay | Admitting: Physician Assistant

## 2016-09-01 VITALS — BP 135/85 | HR 88 | Temp 98.2°F | Ht 64.0 in | Wt 196.8 lb

## 2016-09-01 DIAGNOSIS — N390 Urinary tract infection, site not specified: Secondary | ICD-10-CM

## 2016-09-01 DIAGNOSIS — R768 Other specified abnormal immunological findings in serum: Secondary | ICD-10-CM

## 2016-09-01 DIAGNOSIS — R740 Nonspecific elevation of levels of transaminase and lactic acid dehydrogenase [LDH]: Secondary | ICD-10-CM

## 2016-09-01 DIAGNOSIS — R202 Paresthesia of skin: Secondary | ICD-10-CM

## 2016-09-01 DIAGNOSIS — R7401 Elevation of levels of liver transaminase levels: Secondary | ICD-10-CM

## 2016-09-01 LAB — POCT URINALYSIS DIPSTICK
Glucose, UA: NEGATIVE
Ketones, UA: NEGATIVE
NITRITE UA: NEGATIVE
PROTEIN UA: NEGATIVE
Spec Grav, UA: <=1.005 — AB (ref 1.010–1.025)
UROBILINOGEN UA: 0.2 U/dL
pH, UA: 6.5 (ref 5.0–8.0)

## 2016-09-01 LAB — POCT GLYCOSYLATED HEMOGLOBIN (HGB A1C): Hemoglobin A1C: 6

## 2016-09-01 MED ORDER — CIPROFLOXACIN HCL 500 MG PO TABS: 500.0000 mg | ORAL_TABLET | Freq: Two times a day (BID) | ORAL | 0 refills | Status: AC

## 2016-09-01 MED ORDER — PHENAZOPYRIDINE HCL 200 MG PO TABS: 200.0000 mg | ORAL_TABLET | Freq: Three times a day (TID) | ORAL | 0 refills | Status: DC | PRN

## 2016-09-01 MED ORDER — VITAMIN B-12 50 MCG PO TABS: 50.0000 ug | ORAL_TABLET | Freq: Every day | ORAL | 0 refills | Status: DC

## 2016-09-01 MED ORDER — FLUCONAZOLE 150 MG PO TABS: 150.0000 mg | ORAL_TABLET | Freq: Once | ORAL | 0 refills | Status: AC

## 2016-09-01 MED FILL — CIPROFLOXACIN HCL 500 MG TA: 500 | 5 days supply | Qty: 10 | Fill #0

## 2016-09-01 MED FILL — FLUCONAZOLE 150 MG TABLET: 150 | 1 days supply | Qty: 1 | Fill #0

## 2016-09-01 NOTE — Patient Instructions (Signed)

## 2016-09-01 NOTE — Progress Notes (Signed)
Subjective:  Patient ID: Brandy Stewart, female    DOB: [birth year]  Age: 66 y.o. MRN: [id number]  CC: Herpes  HPI Brandy Stewart is a 66 y.o. female with no significant PMH presents on f/u of ED visit on 08/26/16 for mouth sores. 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 Did not receive a final diagnosis or treatment from ED visit on 08/26/16. 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 New partner, sexually active for approximately six months. Recent HIV negative. Has family members with Lupus. Elevated liver enzymes at ED.    Still has some dysuria. Recent urine cx from ED visit revealed <10K CFU. Endorses polydipsia, polyuria, polyphagia. Visual blurring also but has been found to have cataracts.   Outpatient Medications Prior to Visit  Medication Sig Dispense Refill  . acyclovir (ZOVIRAX) 400 MG tablet Take 1 tablet (400 mg total) by mouth 4 (four) times daily. 40 tablet 0  . levofloxacin (LEVAQUIN) 500 MG tablet Take 1 tablet (500 mg total) by mouth daily. 7 tablet 0  . nystatin (MYCOSTATIN) 100000 UNIT/ML suspension Take 5 mLs (500,000 Units total) by mouth 4 (four) times daily. 60 mL 0  . ranitidine (ZANTAC) 150 MG tablet Take 1 tablet (150 mg total) by mouth 2 (two) times daily. 60 tablet 0  . acetaminophen (TYLENOL) 500 MG tablet Take 1,000 mg by mouth every 6 (six) hours as needed for mild pain.    Marland Kitchen ondansetron (ZOFRAN ODT) 4 MG disintegrating tablet 4mg  ODT q4 hours prn nausea/vomit (Patient not taking: Reported on 09/01/2016) 12 tablet 0  . traMADol (ULTRAM) 50 MG tablet Take 1 tablet (50 mg total) by mouth every 6 (six) hours as needed. (Patient not taking: Reported on 08/23/2016) 15 tablet 0   No facility-administered medications prior to visit.      ROS Review of Systems  Constitutional: Negative for chills, fever and malaise/fatigue.  Eyes: Negative for blurred vision.  Respiratory: Negative for  shortness of breath.   Cardiovascular: Negative for chest pain and palpitations.  Gastrointestinal: Negative for abdominal pain and nausea.  Genitourinary: Positive for dysuria and frequency. Negative for hematuria.  Musculoskeletal: Negative for joint pain and myalgias.  Skin: Negative for rash.  Neurological: Positive for tingling. Negative for headaches.  Psychiatric/Behavioral: Negative for depression. The patient is not nervous/anxious.     Objective:  BP 135/85 (BP Location: Left Arm, Patient Position: Sitting, Cuff Size: Large)   Pulse 88   Temp 98.2 F (36.8 C) (Oral)   Ht 5\' 4"  (1.626 m)   Wt 196 lb 12.8 oz (89.3 kg)   SpO2 98%   BMI 33.78 kg/m   BP/Weight 09/01/2016 7/82/9562 02/18/863  Systolic BP 784 696 295  Diastolic BP 85 68 75  Wt. (Lbs) 196.8 200 200  BMI 33.78 34.33 32.28      Physical Exam  Constitutional: She is oriented to person, place, and time.  Well developed, overweight, NAD, polite  HENT:  Head: Normocephalic and atraumatic.  Mouth/Throat: No oropharyngeal exudate.  Eyes: No scleral icterus.  Neck: Normal range of motion. Neck supple. No thyromegaly present.  Cardiovascular: Normal rate, regular rhythm and normal heart sounds.   Pulmonary/Chest: Effort normal and breath sounds normal.  Musculoskeletal: She exhibits no deformity.  Right foot without erythema, edema, ecchymosis, limited aROM, or TTP.  Neurological: She is alert and oriented to person, place, and time. No cranial nerve deficit. Coordination normal.  Skin:  Skin is warm and dry. No rash noted. No erythema. No pallor.  Psychiatric: She has a normal mood and affect. Her behavior is normal. Thought content normal.  Vitals reviewed.    Assessment & Plan:   1. Urinary tract infection without hematuria, site unspecified - Urinalysis Dipstick with large leukocytes - Urine Culture - Begin ciprofloxacin (CIPRO) 500 MG tablet; Take 1 tablet (500 mg total) by mouth 2 (two) times daily.   Dispense: 10 tablet; Refill: 0 - Begin phenazopyridine (PYRIDIUM) 200 MG tablet; Take 1 tablet (200 mg total) by mouth 3 (three) times daily as needed for pain.  Dispense: 6 tablet; Refill: 0  2. Paresthesia of right foot - HgB A1c - Begin vitamin B-12 (CYANOCOBALAMIN) 50 MCG tablet; Take 1 tablet (50 mcg total) by mouth daily.  Dispense: 30 tablet; Refill: 0  3. Biological false positive RPR test - ANA w/Reflex - Hepatitis panel, acute - [place] spotted fvr ab, IgG-blood  4. Transaminitis - Comprehensive metabolic panel   Meds ordered this encounter  Medications  . ciprofloxacin (CIPRO) 500 MG tablet    Sig: Take 1 tablet (500 mg total) by mouth 2 (two) times daily.    Dispense:  10 tablet    Refill:  0    Order Specific Question:   Supervising Provider    Answer:   Tresa Garter [id number]  . phenazopyridine (PYRIDIUM) 200 MG tablet    Sig: Take 1 tablet (200 mg total) by mouth 3 (three) times daily as needed for pain.    Dispense:  6 tablet    Refill:  0    Order Specific Question:   Supervising Provider    Answer:   Tresa Garter [id number]  . vitamin B-12 (CYANOCOBALAMIN) 50 MCG tablet    Sig: Take 1 tablet (50 mcg total) by mouth daily.    Dispense:  30 tablet    Refill:  0    Order Specific Question:   Supervising Provider    Answer:   Tresa Garter [id number]    Follow-up: Return in about 2 weeks (around 09/15/2016).   Clent Demark PA

## 2016-09-02 ENCOUNTER — Telehealth (INDEPENDENT_AMBULATORY_CARE_PROVIDER_SITE_OTHER): Payer: Self-pay | Admitting: Physician Assistant

## 2016-09-02 NOTE — Telephone Encounter (Signed)
Pt clled to speak with the PCP or the nurse, since she was inform that her lab result came out negative, she would like to know if she can continue taking the med.  or not acyclovir (ZOVIRAX) 400 MG tablet   Please follow up

## 2016-09-02 NOTE — Telephone Encounter (Signed)
FWD to PCP. Tempestt S Roberts, CMA  

## 2016-09-03 LAB — URINE CULTURE: ORGANISM ID, BACTERIA: NO GROWTH

## 2016-09-03 NOTE — Telephone Encounter (Signed)
Not needed unless she has an outbreak.

## 2016-09-04 LAB — COMPREHENSIVE METABOLIC PANEL
ALT: 18 IU/L (ref 0–32)
AST: 26 IU/L (ref 0–40)
Albumin/Globulin Ratio: 1.1 — ABNORMAL LOW (ref 1.2–2.2)
Albumin: 3.9 g/dL (ref 3.6–4.8)
Alkaline Phosphatase: 75 IU/L (ref 39–117)
BILIRUBIN TOTAL: 0.3 mg/dL (ref 0.0–1.2)
BUN/Creatinine Ratio: 6 — ABNORMAL LOW (ref 12–28)
BUN: 4 mg/dL — AB (ref 8–27)
CALCIUM: 9.2 mg/dL (ref 8.7–10.3)
CO2: 27 mmol/L (ref 20–29)
CREATININE: 0.65 mg/dL (ref 0.57–1.00)
Chloride: 100 mmol/L (ref 96–106)
GFR calc non Af Amer: 95 mL/min/{1.73_m2} (ref >59)
GFR, EST AFRICAN AMERICAN: 109 mL/min/{1.73_m2} (ref >59)
GLUCOSE: 98 mg/dL (ref 65–99)
Globulin, Total: 3.6 g/dL (ref 1.5–4.5)
Potassium: 3.8 mmol/L (ref 3.5–5.2)
Sodium: 141 mmol/L (ref 134–144)
TOTAL PROTEIN: 7.5 g/dL (ref 6.0–8.5)

## 2016-09-04 LAB — ENA+DNA/DS+SJORGEN'S
ENA RNP AB: 0.3 AI (ref 0.0–0.9)
ENA SSA (RO) AB: 6.9 AI — AB (ref 0.0–0.9)
ENA SSB (LA) Ab: <0.2 AI (ref 0.0–0.9)
dsDNA Ab: 35 IU/mL — ABNORMAL HIGH (ref 0–9)

## 2016-09-04 LAB — HEPATITIS PANEL, ACUTE
HEP A IGM: NEGATIVE
HEP C VIRUS AB: 0.2 {s_co_ratio} (ref 0.0–0.9)
Hep B C IgM: NEGATIVE
Hepatitis B Surface Ag: NEGATIVE

## 2016-09-04 LAB — ROCKY MTN SPOTTED FVR AB, IGG-BLOOD: RMSF IgG: NEGATIVE

## 2016-09-04 LAB — ANA W/REFLEX: Anti Nuclear Antibody(ANA): POSITIVE — AB

## 2016-09-06 ENCOUNTER — Other Ambulatory Visit (INDEPENDENT_AMBULATORY_CARE_PROVIDER_SITE_OTHER): Payer: Self-pay | Admitting: Physician Assistant

## 2016-09-06 DIAGNOSIS — R768 Other specified abnormal immunological findings in serum: Secondary | ICD-10-CM

## 2016-09-06 NOTE — Telephone Encounter (Signed)
Patient informed. Ambry Dix S Brandy Stewart, CMA  

## 2016-09-06 NOTE — Telephone Encounter (Signed)
Patient has voicemail that has not yet been set up. Will call back. Nat Christen, CMA

## 2016-09-08 ENCOUNTER — Other Ambulatory Visit (INDEPENDENT_AMBULATORY_CARE_PROVIDER_SITE_OTHER): Payer: Self-pay | Admitting: Physician Assistant

## 2016-09-08 DIAGNOSIS — T148XXA Other injury of unspecified body region, initial encounter: Secondary | ICD-10-CM

## 2016-09-08 MED ORDER — METHOCARBAMOL 500 MG PO TABS: 500.0000 mg | ORAL_TABLET | Freq: Three times a day (TID) | ORAL | 0 refills | Status: DC

## 2016-09-09 MED FILL — METHOCARBAMOL 500 MG TABLET: 500 | 7 days supply | Qty: 21 | Fill #0

## 2016-09-16 ENCOUNTER — Ambulatory Visit (INDEPENDENT_AMBULATORY_CARE_PROVIDER_SITE_OTHER): Payer: Self-pay | Admitting: Physician Assistant

## 2016-09-30 ENCOUNTER — Telehealth (INDEPENDENT_AMBULATORY_CARE_PROVIDER_SITE_OTHER): Payer: Self-pay | Admitting: Physician Assistant

## 2016-09-30 NOTE — Telephone Encounter (Signed)
Noted  

## 2016-09-30 NOTE — Telephone Encounter (Signed)
Patient called to reschedule her appointment for tomorrow and I saw her note about the Rheumatology Referral patient is uninsured she has the [facility] card [facility] and is not rheumatology specialist with that and I ask the patient since she don't have insurance if I can refer her to [facility] she will be able to apply for Financial assistance there and she denied the referral and I called [facility] and first visit $250 she can't afford it . I wanted to let you know that

## 2016-10-01 ENCOUNTER — Ambulatory Visit (INDEPENDENT_AMBULATORY_CARE_PROVIDER_SITE_OTHER): Payer: Self-pay | Admitting: Physician Assistant

## 2016-10-06 ENCOUNTER — Telehealth (INDEPENDENT_AMBULATORY_CARE_PROVIDER_SITE_OTHER): Payer: Self-pay | Admitting: Physician Assistant

## 2016-10-06 NOTE — Telephone Encounter (Signed)
Returned patients call and she has a Advertising account executive that is not yet setup. Nat Christen, CMA

## 2016-10-06 NOTE — Telephone Encounter (Signed)
Spoke with patient.

## 2016-10-06 NOTE — Telephone Encounter (Signed)
Patient wants to talk to [place]  She didn't say what is in regarding . Please, call her

## 2016-10-21 ENCOUNTER — Ambulatory Visit (INDEPENDENT_AMBULATORY_CARE_PROVIDER_SITE_OTHER): Payer: Self-pay | Admitting: Physician Assistant

## 2016-10-21 ENCOUNTER — Encounter (INDEPENDENT_AMBULATORY_CARE_PROVIDER_SITE_OTHER): Payer: Self-pay | Admitting: Physician Assistant

## 2016-10-21 VITALS — BP 113/74 | HR 83 | Temp 98.6°F | Resp 18 | Ht 64.0 in | Wt 190.0 lb

## 2016-10-21 DIAGNOSIS — R300 Dysuria: Secondary | ICD-10-CM

## 2016-10-21 DIAGNOSIS — M359 Systemic involvement of connective tissue, unspecified: Secondary | ICD-10-CM

## 2016-10-21 DIAGNOSIS — D8989 Other specified disorders involving the immune mechanism, not elsewhere classified: Secondary | ICD-10-CM

## 2016-10-21 DIAGNOSIS — J039 Acute tonsillitis, unspecified: Secondary | ICD-10-CM

## 2016-10-21 LAB — POCT URINALYSIS DIPSTICK
BILIRUBIN UA: NEGATIVE
GLUCOSE UA: NEGATIVE
Ketones, UA: NEGATIVE
NITRITE UA: NEGATIVE
PROTEIN UA: NEGATIVE
Spec Grav, UA: 1.025 (ref 1.010–1.025)
Urobilinogen, UA: 0.2 E.U./dL
pH, UA: 5.5 (ref 5.0–8.0)

## 2016-10-21 MED ORDER — CIPROFLOXACIN HCL 500 MG PO TABS: 500.0000 mg | ORAL_TABLET | Freq: Two times a day (BID) | ORAL | 0 refills | Status: AC

## 2016-10-21 MED ORDER — DOXYCYCLINE HYCLATE 100 MG PO TABS: 100.0000 mg | ORAL_TABLET | Freq: Two times a day (BID) | ORAL | 0 refills | Status: DC

## 2016-10-21 MED FILL — CIPROFLOXACIN HCL 500 MG TA: 500 | 7 days supply | Qty: 10 | Fill #0

## 2016-10-21 MED FILL — ?DOXYCYCLINE HYC 100MG TAB: 100 | 10 days supply | Qty: 20 | Fill #0

## 2016-10-21 NOTE — Progress Notes (Signed)
Subjective:  Patient ID: Brandy Stewart, female    DOB: 12-08-1950  Age: 66 y.o. MRN: [id number]  CC: f/u  HPI Brandy Stewart is a 66 y.o. female with no significant medical history presents on f/u of false positive RPR. ANA subsequently ordered and resulted in positive with a positive dsDNA Ab and SSA Ab. Referred to rheumatology but patient has not gone because she was referred to an office in [facility]. Says she "does not know anything about Rondall Allegra" and wants to be referred to her granddaughter's Rheumatologist in [facility]. She is on a fixed income and does not know if she will be able to pay any requested copay from rheumatology.     Patient also complains of dysuria for a few days. Associated with urinary frequency. No flank pain, back pain, f/c/n/v, or swelling. Has not taken anything for relief.       Outpatient Medications Prior to Visit  Medication Sig Dispense Refill  . levofloxacin (LEVAQUIN) 500 MG tablet Take 1 tablet (500 mg total) by mouth daily. 7 tablet 0  . methocarbamol (ROBAXIN) 500 MG tablet Take 1 tablet (500 mg total) by mouth 3 (three) times daily. 21 tablet 0  . acetaminophen (TYLENOL) 500 MG tablet Take 1,000 mg by mouth every 6 (six) hours as needed for mild pain.    Marland Kitchen acyclovir (ZOVIRAX) 400 MG tablet Take 1 tablet (400 mg total) by mouth 4 (four) times daily. (Patient not taking: Reported on 10/21/2016) 40 tablet 0  . phenazopyridine (PYRIDIUM) 200 MG tablet Take 1 tablet (200 mg total) by mouth 3 (three) times daily as needed for pain. (Patient not taking: Reported on 10/21/2016) 6 tablet 0  . ranitidine (ZANTAC) 150 MG tablet Take 1 tablet (150 mg total) by mouth 2 (two) times daily. (Patient not taking: Reported on 10/21/2016) 60 tablet 0  . traMADol (ULTRAM) 50 MG tablet Take 1 tablet (50 mg total) by mouth every 6 (six) hours as needed. (Patient not taking: Reported on 08/23/2016) 15 tablet 0  . vitamin B-12 (CYANOCOBALAMIN) 50 MCG tablet Take 1 tablet  (50 mcg total) by mouth daily. (Patient not taking: Reported on 10/21/2016) 30 tablet 0  . nystatin (MYCOSTATIN) 100000 UNIT/ML suspension Take 5 mLs (500,000 Units total) by mouth 4 (four) times daily. 60 mL 0  . ondansetron (ZOFRAN ODT) 4 MG disintegrating tablet 4mg  ODT q4 hours prn nausea/vomit (Patient not taking: Reported on 09/01/2016) 12 tablet 0   No facility-administered medications prior to visit.      ROS Review of Systems  Constitutional: Negative for chills, fever and malaise/fatigue.  HENT: Positive for sore throat.   Eyes: Negative for blurred vision.  Respiratory: Negative for shortness of breath.   Cardiovascular: Negative for chest pain and palpitations.  Gastrointestinal: Negative for abdominal pain and nausea.  Genitourinary: Positive for dysuria. Negative for hematuria.  Musculoskeletal: Negative for joint pain and myalgias.  Skin: Negative for rash.  Neurological: Negative for tingling and headaches.  Psychiatric/Behavioral: Negative for depression. The patient is not nervous/anxious.     Objective:  BP 113/74 (BP Location: Left Arm, Patient Position: Sitting, Cuff Size: Large)   Pulse 83   Temp 98.6 F (37 C) (Oral)   Resp 18   Ht 5\' 4"  (1.626 m)   Wt 190 lb (86.2 kg)   SpO2 98%   BMI 32.61 kg/m   BP/Weight 10/21/2016 09/01/2016 8/50/2774  Systolic BP 128 786 767  Diastolic BP 74 85 68  Wt. (Lbs) 190  196.8 200  BMI 32.61 33.78 34.33      Physical Exam  Constitutional: She is oriented to person, place, and time.  Well developed, well nourished, NAD, polite  HENT:  Head: Normocephalic and atraumatic.  Tonsil 2+ bilaterally with associated erythema.   Eyes: No scleral icterus.  Neck: Normal range of motion. Neck supple.  Cardiovascular: Normal rate, regular rhythm and normal heart sounds.   Pulmonary/Chest: Effort normal and breath sounds normal.  Abdominal: Soft. Bowel sounds are normal. There is no tenderness.  Genitourinary:  Genitourinary  Comments: No CVA tenderness bilaterally  Musculoskeletal: She exhibits no edema.  Lymphadenopathy:    She has no cervical adenopathy.  Neurological: She is alert and oriented to person, place, and time. No cranial nerve deficit. Coordination normal.  Skin: Skin is warm and dry. No rash noted. No erythema. No pallor.  Psychiatric: She has a normal mood and affect. Her behavior is normal. Thought content normal.  Vitals reviewed.    Assessment & Plan:    1. Burning with urination - Urinalysis Dipstick positive for small leukocytes - Urine Culture - Begin ciprofloxacin (CIPRO) 500 MG tablet; Take 1 tablet (500 mg total) by mouth 2 (two) times daily.  Dispense: 10 tablet; Refill: 0  2. Autoimmune disease ([place]) - 10 minutes spent working with referral specialist to find out what happened to patient's rheumatology referral. There is no rheumatologist in network that would accept to see patient for free despite having [facility] financial assistance.  - DG Chest 2 View; Future - Hepatitis panel, acute  3. Tonsillitis - pt complained of throat pain after I gave her AVS. Brief examination revealed right sided tonsillitis with no uvular shift or neck edema. I have asked patient to let scheduling personnel know about which complaints she would like to address so she may be properly scheduled.  - Begin Doxycyline. Return or go to Urgent Care if no better within 72 hours.      Meds ordered this encounter  Medications  . ciprofloxacin (CIPRO) 500 MG tablet    Sig: Take 1 tablet (500 mg total) by mouth 2 (two) times daily.    Dispense:  10 tablet    Refill:  0    Order Specific Question:   Supervising Provider    Answer:   Tresa Garter [id number]    Follow-up: Return in about 8 weeks (around 12/16/2016) for autoimmune disease.   Clent Demark PA

## 2016-10-21 NOTE — Patient Instructions (Signed)
Systemic Lupus Erythematosus, Adult Systemic lupus erythematosus is a long-term (chronic) disease that can affect many parts of the body. It can damage the skin, joints, blood vessels, brain, kidneys, lungs, heart, and other internal organs. It causes pain, irritation, and inflammation. Systemic lupus erythematosus is an autoimmune disease. With this type of disease, the body's defense system (immune system) mistakenly attacks normal tissues instead of attacking germs or abnormal growths. What are the causes? The cause of this condition is not known. What increases the risk? This condition is more likely to develop in:  Females.  People of Asian descent.  People of African-American descent.  People who have a family history of the condition.  What are the signs or symptoms? General symptoms include:  Joint pain and swelling (common).  Fever.  Fatigue.  Unusual weight loss or weight gain.  Skin rashes, especially over the nose and cheeks (butterfly rash) and after sun exposure.  Sores inside the mouth or nose.  Other symptoms depend on which parts of the body are affected. They can include:  Shortness of breath.  Chest pain.  Frequent urination.  Blood in the urine.  Seizures.  Mental changes.  Hair loss.  Swollen and tender lymph nodes.  Swelling of the hands or feet.  Symptoms can come and go. A period of time when symptoms get worse or come back is called a flare. A period of time with no symptoms is called a remission. How is this diagnosed? This condition is diagnosed based on symptoms, a medical history, and a physical exam. You may also have tests, including:  Blood tests.  Urine tests.  A chest X-ray.  A skin or kidney biopsy. For this test, a sample of tissue is taken from the skin or kidney and studied under a microscope.  You may be referred to an autoimmune disease specialist (rheumatologist). How is this treated? There is no cure for this  condition, but treatment can keep the disease in remission, help to control symptoms, and prevent damage to the heart, lungs, kidneys, and other organs. Treatment may involve taking a combination of medicines over time. Follow these instructions at home: Medicines  Take medicines only as directed by your health care provider.  Do not take any medicines that contain estrogen without first checking with your health care provider. Estrogen can trigger flares and may increase your risk for blood clots. Lifestyle  Eat a heart-healthy diet.  Stay active as directed by your health care provider.  Do not smoke. If you need help quitting, ask your health care provider.  Protect your skin from the sun by applying sunblock and wearing protective hats and clothing.  Learn as much as you can about your condition and have a good support system in place. Support may come from family, friends, or a lupus support group. General instructions  Keep all follow-up visits as directed by your health care provider. This is important.  Work closely with all of your health care providers to manage your condition.  Let your health care provider know right away if you become pregnant or if you plan to become pregnant. Pregnancy in women with this condition is considered high risk. Contact a health care provider if:  You have a fever.  Your symptoms flare.  You develop new symptoms.  You develop swollen feet or hands.  You develop puffiness around your eyes.  Your medicines are not working.  You have bloody, foamy, or coffee-colored urine.  There are changes in your   urination. For example, you urinate more often at night.  You think that you may be depressed or have anxiety. Get help right away if:  You have chest pain.  You have trouble breathing.  You have a seizure.  You suddenly get a very bad headache.  You suddenly develop facial or body weakness.  You cannot speak.  You cannot  understand speech. This information is not intended to replace advice given to you by your health care provider. Make sure you discuss any questions you have with your health care provider. Document Released: 01/22/2002 Document Revised: 09/28/2015 Document Reviewed: 01/09/2014 Elsevier Interactive Patient Education  2018 Elsevier Inc.  

## 2016-10-22 LAB — URINE CULTURE: ORGANISM ID, BACTERIA: NO GROWTH

## 2016-10-25 ENCOUNTER — Ambulatory Visit (HOSPITAL_COMMUNITY)
Admission: RE | Admit: 2016-10-25 | Discharge: 2016-10-25 | Disposition: A | Discharge status: Home / Self Care | Payer: Self-pay | Source: Ambulatory Visit | Attending: Physician Assistant | Admitting: Physician Assistant

## 2016-10-25 DIAGNOSIS — M359 Systemic involvement of connective tissue, unspecified: Secondary | ICD-10-CM

## 2016-10-25 DIAGNOSIS — D8989 Other specified disorders involving the immune mechanism, not elsewhere classified: Secondary | ICD-10-CM | POA: Insufficient documentation

## 2016-10-25 NOTE — Telephone Encounter (Signed)
-----   Message from Clent Demark, PA-C sent at 10/25/2016  8:52 AM EDT ----- No growth on urine culture.  Finish cipro. Return here if she still has urinary symptoms.

## 2016-10-25 NOTE — Telephone Encounter (Signed)
MA unable to contact the patient due to no ringing option and voice message system not being set up.  !!Please inform patient of no growth being noted in the urine. Finish Cipro antibiotics and follow up with the clinic if symptoms to not subside!!!

## 2016-10-28 ENCOUNTER — Telehealth: Payer: Self-pay | Admitting: *Deleted

## 2016-10-28 NOTE — Telephone Encounter (Signed)
MA unable to reach the patient and the system is not set up. !!!Please inform the patient of no active cardiopulmonary disease being noted!!!

## 2016-10-28 NOTE — Telephone Encounter (Signed)
-----   Message from Clent Demark, PA-C sent at 10/25/2016 12:30 PM EDT ----- No active cardiopulmonary disease. Please notify patient. Thank you.

## 2016-11-26 ENCOUNTER — Emergency Department (HOSPITAL_COMMUNITY)
Admission: EM | Admit: 2016-11-26 | Discharge: 2016-11-26 | Disposition: A | Discharge status: Home / Self Care | Payer: Self-pay | Attending: Emergency Medicine | Admitting: Emergency Medicine

## 2016-11-26 ENCOUNTER — Encounter (HOSPITAL_COMMUNITY): Payer: Self-pay | Admitting: Emergency Medicine

## 2016-11-26 DIAGNOSIS — R300 Dysuria: Secondary | ICD-10-CM | POA: Insufficient documentation

## 2016-11-26 DIAGNOSIS — R21 Rash and other nonspecific skin eruption: Secondary | ICD-10-CM

## 2016-11-26 DIAGNOSIS — L42 Pityriasis rosea: Secondary | ICD-10-CM | POA: Insufficient documentation

## 2016-11-26 LAB — URINALYSIS, ROUTINE W REFLEX MICROSCOPIC
BILIRUBIN URINE: NEGATIVE
Glucose, UA: NEGATIVE mg/dL
HGB URINE DIPSTICK: NEGATIVE
Ketones, ur: NEGATIVE mg/dL
NITRITE: NEGATIVE
PH: 6 (ref 5.0–8.0)
Protein, ur: NEGATIVE mg/dL
SPECIFIC GRAVITY, URINE: 1.015 (ref 1.005–1.030)

## 2016-11-26 MED ORDER — PHENAZOPYRIDINE HCL 200 MG PO TABS: 200.0000 mg | ORAL_TABLET | Freq: Three times a day (TID) | ORAL | 0 refills | Status: DC

## 2016-11-26 NOTE — ED Provider Notes (Signed)
[facility] DEPT Provider Note   CSN: [id number] Arrival date & time: 11/26/16  0813     History   Chief Complaint Chief Complaint  Patient presents with  . Rash    HPI Brandy Stewart is a 66 y.o. female presenting With acute onset of worsening itchy rash that began about 1 week ago. Patient states the rash began with a circular area to the left lateral abdomen that has been itching. She states she is had more rashes appear on her trunk, that are also itchy. No sick contacts. No insect bites or tick bites. She states rash is not painful, no drainage, no fever or chills, no nausea or vomiting, no swelling of lips or tongue, no difficulty breathing or swallowing. She states she had some runny nose prior to the rash.  She reports a second complaint of dysuria that has been ongoing for almost a week as well. Reports associated increased urinary frequency. Denies abdominal pain, flank pain, vaginal bleeding or discharge, or other complaints.  The history is provided by the patient.    History reviewed. No pertinent past medical history.  There are no active problems to display for this patient.   Past Surgical History:  Procedure Laterality Date  . PARTIAL HYSTERECTOMY      OB History    No data available       Home Medications    Prior to Admission medications   Medication Sig Start Date End Date Taking? Authorizing Provider  acetaminophen (TYLENOL) 500 MG tablet Take 1,000 mg by mouth every 6 (six) hours as needed for mild pain.    [provider]  doxycycline (VIBRA-TABS) 100 MG tablet Take 1 tablet (100 mg total) by mouth 2 (two) times daily. 10/21/16   Clent Demark, PA-C  levofloxacin (LEVAQUIN) 500 MG tablet Take 1 tablet (500 mg total) by mouth daily. 08/23/16   Milton Ferguson, MD  methocarbamol (ROBAXIN) 500 MG tablet Take 1 tablet (500 mg total) by mouth 3 (three) times daily. 09/08/16   Clent Demark, PA-C  phenazopyridine (PYRIDIUM) 200 MG tablet  Take 1 tablet (200 mg total) by mouth 3 (three) times daily. 11/26/16   Russo, [place] N, PA-C    Family History No family history on file.  Social History Social History  Substance Use Topics  . Smoking status: Never Smoker  . Smokeless tobacco: Never Used  . Alcohol use No     Allergies   Penicillins   Review of Systems Review of Systems  Constitutional: Negative for chills and fever.  HENT: Negative for facial swelling and trouble swallowing.   Respiratory: Negative for shortness of breath and stridor.   Gastrointestinal: Negative for abdominal distention, nausea and vomiting.  Genitourinary: Positive for dysuria and frequency. Negative for flank pain, hematuria, vaginal bleeding and vaginal discharge.  Skin: Positive for rash.     Physical Exam Updated Vital Signs BP 137/80 (BP Location: Right Arm)   Pulse 81   Temp 98.6 F (37 C) (Oral)   Resp 16   Ht 5\' 4"  (1.626 m)   Wt 88.5 kg (195 lb)   SpO2 100%   BMI 33.47 kg/m   Physical Exam  Constitutional: She appears well-developed and well-nourished. No distress.  HENT:  Head: Normocephalic and atraumatic.  Eyes: Conjunctivae are normal.  Cardiovascular: Normal rate and intact distal pulses.   Pulmonary/Chest: Effort normal.  Abdominal: Soft. Bowel sounds are normal. She exhibits no distension. There is no tenderness. There is no rebound, no guarding  and no CVA tenderness.  Skin:  Large 3cm Oval-shaped scaly patch to left abdomen, other small similar appearing patches throughout trunk, that follow langer lines in distribution. No bulla, vesicles, desquamation, purulent drainage, warmth, or petechia.  Psychiatric: She has a normal mood and affect. Her behavior is normal.  Nursing note and vitals reviewed.    ED Treatments / Results  Labs (all labs ordered are listed, but only abnormal results are displayed) Labs Reviewed  URINALYSIS, ROUTINE W REFLEX MICROSCOPIC - Abnormal; Notable for the following:        Result Value   Leukocytes, UA SMALL (*)    Bacteria, UA RARE (*)    Squamous Epithelial / LPF 0-5 (*)    All other components within normal limits  URINE CULTURE    EKG  EKG Interpretation None       Radiology No results found.  Procedures Procedures (including critical care time)  Medications Ordered in ED Medications - No data to display   Initial Impression / Assessment and Plan / ED Course  I have reviewed the triage vital signs and the nursing notes.  Pertinent labs & imaging results that were available during my care of the patient were reviewed by me and considered in my medical decision making (see chart for details).     Pt presenting with rash, consistent with pityriasis rosea. Herald patch located left abdomen, with multiple similar smaller appearing lesions throughout trunk along [employer]. Patient without symptoms of throat closing, or tongue edema. No vesicles, bulla, desquamation, petechia. She is afebrile and nontoxic appearing. Discussed clinical course of pityriasis rosea, and symptomatic management. She also complaining of dysuria and urinary frequency, however urinalysis is unremarkable. This was discussed with Dr. Lacinda Axon, who recommends Pyridium for symptoms and strict return precautions. Urine culture sent. Denies flank pain, nausea or vomiting, or other complaints. Will discharge with symptomatic management and strict return precautions. Patient is to follow up with PCP. She is well-appearing and safe for discharge at this time.  Patient discussed with Dr. Lacinda Axon.  Discussed results, findings, treatment and follow up. Patient advised of return precautions. Patient verbalized understanding and agreed with plan.   Final Clinical Impressions(s) / ED Diagnoses   Final diagnoses:  Pityriasis rosea-like skin eruption  Dysuria    New Prescriptions Discharge Medication List as of 11/26/2016 10:07 AM    START taking these medications   Details    phenazopyridine (PYRIDIUM) 200 MG tablet Take 1 tablet (200 mg total) by mouth 3 (three) times daily., Starting Fri 11/26/2016, Print         Russo, [place] N, PA-C 11/26/16 Banks, [place] N, PA-C 11/26/16 1258    Russo, [place] N, PA-C 11/26/16 1258    Nat Christen, MD 11/27/16 1535

## 2016-11-26 NOTE — Discharge Instructions (Signed)
Please read instructions below. Take 25mg  of benadryl every 6 hours for rash and itching.  This makes her too drowsy, you can take Zyrtec or Claritin. Avoid itching as much as possible, to prevent secondary bacterial infection. You can take the Pyridium up to 3 times daily as needed for pain with urination. Be aware that this medication can cause your urine to turn orange as well as your other secretions. Schedule an appointment with your primary care provider to follow up on your rash and urinary symptoms. Return to the ER immediately for feeling your throat closing, swelling of your lips or tongue, difficulty breathing, fever, nausea or vomiting, or new or concerning symptoms.

## 2016-11-26 NOTE — ED Triage Notes (Signed)
Pt reports intermittent circular rash, deneis fever, chills, pain.

## 2016-11-27 LAB — URINE CULTURE: Culture: 10000 — AB

## 2016-12-23 ENCOUNTER — Ambulatory Visit (INDEPENDENT_AMBULATORY_CARE_PROVIDER_SITE_OTHER): Payer: Self-pay | Admitting: Physician Assistant

## 2016-12-23 ENCOUNTER — Encounter (INDEPENDENT_AMBULATORY_CARE_PROVIDER_SITE_OTHER): Payer: Self-pay | Admitting: Physician Assistant

## 2016-12-23 VITALS — BP 143/83 | HR 70 | Temp 98.0°F | Wt 192.2 lb

## 2016-12-23 DIAGNOSIS — L930 Discoid lupus erythematosus: Secondary | ICD-10-CM

## 2016-12-23 DIAGNOSIS — Z79899 Other long term (current) drug therapy: Secondary | ICD-10-CM

## 2016-12-23 DIAGNOSIS — Z1321 Encounter for screening for nutritional disorder: Secondary | ICD-10-CM

## 2016-12-23 DIAGNOSIS — R300 Dysuria: Secondary | ICD-10-CM

## 2016-12-23 DIAGNOSIS — N390 Urinary tract infection, site not specified: Secondary | ICD-10-CM

## 2016-12-23 LAB — POCT URINALYSIS DIPSTICK
BILIRUBIN UA: NEGATIVE
GLUCOSE UA: NEGATIVE
KETONES UA: NEGATIVE
Nitrite, UA: NEGATIVE
Protein, UA: NEGATIVE
SPEC GRAV UA: 1.02 (ref 1.010–1.025)
Urobilinogen, UA: 0.2 E.U./dL
pH, UA: 6.5 (ref 5.0–8.0)

## 2016-12-23 MED ORDER — CLOBETASOL PROPIONATE 0.05 % EX GEL: 1.0000 "application " | Freq: Two times a day (BID) | CUTANEOUS | 0 refills | Status: AC

## 2016-12-23 MED ORDER — CIPROFLOXACIN HCL 500 MG PO TABS: 500.0000 mg | ORAL_TABLET | Freq: Two times a day (BID) | ORAL | 0 refills | Status: AC

## 2016-12-23 MED FILL — CIPROFLOXACIN HCL 500 MG TA: 500 | 3 days supply | Qty: 6 | Fill #0

## 2016-12-23 MED FILL — CLOBETASOL 0.05% GEL: 0.05 | 7 days supply | Qty: 60 | Fill #0

## 2016-12-23 NOTE — Progress Notes (Signed)
Subjective:  Patient ID: Brandy Stewart, female    DOB: 11-19-1950  Age: 66 y.o. MRN: [id number]  CC: f/u autoimmune disease, rash, dysuria  HPI  Brandy Stewart is a 66 y.o. female with no significant medical history presents on f/u of false positive RPR. ANA subsequently ordered and resulted in positive with a positive dsDNA Ab and SSA Ab. Initially referred to Dr. Estanislado Pandy but was denied (details in the referral note). Referred to rheumatology but patient has not gone because she was referred to an office in [facility]. Says she "does not know anything about Rondall Allegra" and wants to be referred to her granddaughter's Rheumatologist in [facility]. She was then referred to [facility] but patient could not afford the care there. She is on a fixed income and does not know if she will be able to pay any requested copay from rheumatology. She is planning to bring in paperwork from [facility] for help with her health concerns.   Patient also complains of dysuria for a few weeks. Associated with urinary frequency. Went to the ED and had culture done which identified Corynebacterium.  No flank pain, back pain, f/c/n/v, or swelling. Has not taken anything for relief.     Also complains of rash on the arms and thorax. Lesions vary in size and are ovoid with overlying scale. Described as very pruritic. Complained of the rash at the ED and was diagnosed with pityriasis rosea like skin eruption. Was not prescribed any medication for the rash.     Outpatient Medications Prior to Visit  Medication Sig Dispense Refill  . acetaminophen (TYLENOL) 500 MG tablet Take 1,000 mg by mouth every 6 (six) hours as needed for mild pain.    Marland Kitchen doxycycline (VIBRA-TABS) 100 MG tablet Take 1 tablet (100 mg total) by mouth 2 (two) times daily. (Patient not taking: Reported on 12/23/2016) 20 tablet 0  . levofloxacin (LEVAQUIN) 500 MG tablet Take 1 tablet (500 mg total) by mouth daily. (Patient not taking: Reported on  12/23/2016) 7 tablet 0  . methocarbamol (ROBAXIN) 500 MG tablet Take 1 tablet (500 mg total) by mouth 3 (three) times daily. (Patient not taking: Reported on 12/23/2016) 21 tablet 0  . phenazopyridine (PYRIDIUM) 200 MG tablet Take 1 tablet (200 mg total) by mouth 3 (three) times daily. (Patient not taking: Reported on 12/23/2016) 6 tablet 0   No facility-administered medications prior to visit.      ROS Review of Systems  Constitutional: Negative for chills, fever and malaise/fatigue.  Eyes: Negative for blurred vision.  Respiratory: Negative for shortness of breath.   Cardiovascular: Negative for chest pain and palpitations.  Gastrointestinal: Negative for abdominal pain and nausea.  Genitourinary: Positive for dysuria. Negative for hematuria.  Musculoskeletal: Negative for joint pain and myalgias.  Skin: Positive for rash.  Neurological: Negative for tingling and headaches.  Psychiatric/Behavioral: Negative for depression. The patient is not nervous/anxious.     Objective:  BP (!) 143/83 (BP Location: Left Arm, Patient Position: Sitting, Cuff Size: Large)   Pulse 70   Temp 98 F (36.7 C) (Oral)   Wt 192 lb 3.2 oz (87.2 kg)   SpO2 98%   BMI 32.99 kg/m   BP/Weight 12/23/2016 41/32/4401 0/03/7251  Systolic BP 664 403 474  Diastolic BP 83 80 74  Wt. (Lbs) 192.2 195 190  BMI 32.99 33.47 32.61      Physical Exam  Constitutional: She is oriented to person, place, and time.  Well developed, overweight, NAD, polite  HENT:  Head: Normocephalic and atraumatic.  Eyes: No scleral icterus.  Cardiovascular: Normal rate, regular rhythm and normal heart sounds.  Pulmonary/Chest: Effort normal and breath sounds normal.  Musculoskeletal: She exhibits no edema.  Neurological: She is alert and oriented to person, place, and time.  Skin: Skin is warm and dry.  Multiple lesions varying in size and are ovoid with overlying scale on the arms, chest, neck, and back. No facial lesions.   Psychiatric: She has a normal mood and affect. Her behavior is normal. Thought content normal.  Vitals reviewed.    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 She has planned to bring me a form for medicaid coverage to ease her financial burden for medical exenses. - Begin Clobetasol proprianate 0.5% gel - Glucose 6 phosphate dehydrogenase - Calcitriol (1,25 di-OH Vit D) - Plan to prescribe Hydroxychloroquine based on lab results.  2. Dysuria - Urinalysis Dipstick pos for small leuks  3. Urinary tract infection without hematuria, site unspecified - Small leuks in the urine - Urine cx from ED one month ago identified Corynebacterium - Urine culture - Begin Cipro  4. Encounter for vitamin deficiency screening - Calcitriol (1,25 di-OH Vit D)   5. High risk medication use - Plan to use Hydroxychloroquine for discoid lupus - Glucose 6 phosphate dehydrogenase - Calcitriol (1,25 di-OH Vit D)    Meds ordered this encounter  Medications  . ciprofloxacin (CIPRO) 500 MG tablet    Sig: Take 1 tablet (500 mg total) 2 (two) times daily for 3 days by mouth.    Dispense:  6 tablet    Refill:  0    Order Specific Question:   Supervising Provider    Answer:   Tresa Garter [id number]  . clobetasol (TEMOVATE) 0.05 % GEL    Sig: Apply 1 application 2 (two) times daily for 7 days topically.    Dispense:  60 each    Refill:  0    Order Specific Question:   Supervising Provider    Answer:   Tresa Garter [[id number]]    Follow-up: 4 weeks  Clent Demark PA

## 2016-12-23 NOTE — Patient Instructions (Signed)
Systemic Lupus Erythematosus, Adult Systemic lupus erythematosus is a long-term (chronic) disease that can affect many parts of the body. It can damage the skin, joints, blood vessels, brain, kidneys, lungs, heart, and other internal organs. It causes pain, irritation, and inflammation. Systemic lupus erythematosus is an autoimmune disease. With this type of disease, the body's defense system (immune system) mistakenly attacks normal tissues instead of attacking germs or abnormal growths. What are the causes? The cause of this condition is not known. What increases the risk? This condition is more likely to develop in:  Females.  People of Asian descent.  People of African-American descent.  People who have a family history of the condition.  What are the signs or symptoms? General symptoms include:  Joint pain and swelling (common).  Fever.  Fatigue.  Unusual weight loss or weight gain.  Skin rashes, especially over the nose and cheeks (butterfly rash) and after sun exposure.  Sores inside the mouth or nose.  Other symptoms depend on which parts of the body are affected. They can include:  Shortness of breath.  Chest pain.  Frequent urination.  Blood in the urine.  Seizures.  Mental changes.  Hair loss.  Swollen and tender lymph nodes.  Swelling of the hands or feet.  Symptoms can come and go. A period of time when symptoms get worse or come back is called a flare. A period of time with no symptoms is called a remission. How is this diagnosed? This condition is diagnosed based on symptoms, a medical history, and a physical exam. You may also have tests, including:  Blood tests.  Urine tests.  A chest X-ray.  A skin or kidney biopsy. For this test, a sample of tissue is taken from the skin or kidney and studied under a microscope.  You may be referred to an autoimmune disease specialist (rheumatologist). How is this treated? There is no cure for this  condition, but treatment can keep the disease in remission, help to control symptoms, and prevent damage to the heart, lungs, kidneys, and other organs. Treatment may involve taking a combination of medicines over time. Follow these instructions at home: Medicines  Take medicines only as directed by your health care provider.  Do not take any medicines that contain estrogen without first checking with your health care provider. Estrogen can trigger flares and may increase your risk for blood clots. Lifestyle  Eat a heart-healthy diet.  Stay active as directed by your health care provider.  Do not smoke. If you need help quitting, ask your health care provider.  Protect your skin from the sun by applying sunblock and wearing protective hats and clothing.  Learn as much as you can about your condition and have a good support system in place. Support may come from family, friends, or a lupus support group. General instructions  Keep all follow-up visits as directed by your health care provider. This is important.  Work closely with all of your health care providers to manage your condition.  Let your health care provider know right away if you become pregnant or if you plan to become pregnant. Pregnancy in women with this condition is considered high risk. Contact a health care provider if:  You have a fever.  Your symptoms flare.  You develop new symptoms.  You develop swollen feet or hands.  You develop puffiness around your eyes.  Your medicines are not working.  You have bloody, foamy, or coffee-colored urine.  There are changes in your   urination. For example, you urinate more often at night.  You think that you may be depressed or have anxiety. Get help right away if:  You have chest pain.  You have trouble breathing.  You have a seizure.  You suddenly get a very bad headache.  You suddenly develop facial or body weakness.  You cannot speak.  You cannot  understand speech. This information is not intended to replace advice given to you by your health care provider. Make sure you discuss any questions you have with your health care provider. Document Released: 01/22/2002 Document Revised: 09/28/2015 Document Reviewed: 01/09/2014 Elsevier Interactive Patient Education  2018 Elsevier Inc.  

## 2016-12-24 LAB — GLUCOSE 6 PHOSPHATE DEHYDROGENASE
G-6-PD, QUANT: 7.7 U/g{Hb} (ref 4.6–13.5)
Hemoglobin: 12.9 g/dL (ref 11.1–15.9)

## 2016-12-24 LAB — CALCITRIOL (1,25 DI-OH VIT D): Vit D, 1,25-Dihydroxy: 32 pg/mL (ref 19.9–79.3)

## 2016-12-25 LAB — URINE CULTURE

## 2016-12-30 ENCOUNTER — Telehealth (INDEPENDENT_AMBULATORY_CARE_PROVIDER_SITE_OTHER): Payer: Self-pay

## 2016-12-30 NOTE — Telephone Encounter (Signed)
-----   Message from Clent Demark, PA-C sent at 12/27/2016  8:29 AM EST ----- Urine culture shows only mixed urogenital bacteria. Finish cipro. Rest of labs are normal. I will need to see her on f/u to assess efficacy of steroid cream on her lesions.

## 2016-12-30 NOTE — Telephone Encounter (Signed)
Left patient a message informing of mixed urogenital bacteria, to finish cipro and keep Follow Up appointment to assess the efficacy(ensure intended outcome) of steroid cream for lesions. Nat Christen, CMA

## 2017-01-20 ENCOUNTER — Ambulatory Visit (INDEPENDENT_AMBULATORY_CARE_PROVIDER_SITE_OTHER): Payer: Self-pay | Admitting: Physician Assistant

## 2017-02-10 ENCOUNTER — Encounter (INDEPENDENT_AMBULATORY_CARE_PROVIDER_SITE_OTHER): Payer: Self-pay | Admitting: Physician Assistant

## 2017-02-10 ENCOUNTER — Ambulatory Visit (INDEPENDENT_AMBULATORY_CARE_PROVIDER_SITE_OTHER): Payer: Self-pay | Admitting: Physician Assistant

## 2017-02-10 ENCOUNTER — Other Ambulatory Visit: Payer: Self-pay

## 2017-02-10 VITALS — BP 145/90 | HR 61 | Temp 98.3°F | Wt 200.8 lb

## 2017-02-10 DIAGNOSIS — Z23 Encounter for immunization: Secondary | ICD-10-CM

## 2017-02-10 DIAGNOSIS — L930 Discoid lupus erythematosus: Secondary | ICD-10-CM

## 2017-02-10 DIAGNOSIS — R7303 Prediabetes: Secondary | ICD-10-CM

## 2017-02-10 DIAGNOSIS — Z1211 Encounter for screening for malignant neoplasm of colon: Secondary | ICD-10-CM

## 2017-02-10 LAB — POCT GLYCOSYLATED HEMOGLOBIN (HGB A1C): Hemoglobin A1C: 5.8

## 2017-02-10 MED ORDER — CLOBETASOL PROPIONATE 0.05 % EX GEL: 1.0000 "application " | Freq: Two times a day (BID) | CUTANEOUS | 3 refills | Status: DC

## 2017-02-10 MED FILL — CLOBETASOL 0.05% GEL: 0.05 | 30 days supply | Qty: 60 | Fill #0

## 2017-02-10 NOTE — Patient Instructions (Signed)
Td Vaccine (Tetanus and Diphtheria): What You Need to Know 1. Why get vaccinated? Tetanus  and diphtheria are very serious diseases. They are rare in the [place] today, but people who do become infected often have severe complications. Td vaccine is used to protect adolescents and adults from both of these diseases. Both tetanus and diphtheria are infections caused by bacteria. Diphtheria spreads from person to person through coughing or sneezing. Tetanus-causing bacteria enter the body through cuts, scratches, or wounds. TETANUS (lockjaw) causes painful muscle tightening and stiffness, usually all over the body.  It can lead to tightening of muscles in the head and neck so you can't open your mouth, swallow, or sometimes even breathe. Tetanus kills about 1 out of every 10 people who are infected even after receiving the best medical care.  DIPHTHERIA can cause a thick coating to form in the back of the throat.  It can lead to breathing problems, paralysis, heart failure, and death.  Before vaccines, as many as 200,000 cases of diphtheria and hundreds of cases of tetanus were reported in the [place] each year. Since vaccination began, reports of cases for both diseases have dropped by about 99%. 2. Td vaccine Td vaccine can protect adolescents and adults from tetanus and diphtheria. Td is usually given as a booster dose every 10 years but it can also be given earlier after a severe and dirty wound or burn. Another vaccine, called Tdap, which protects against pertussis in addition to tetanus and diphtheria, is sometimes recommended instead of Td vaccine. Your doctor or the person giving you the vaccine can give you more information. Td may safely be given at the same time as other vaccines. 3. Some people should not get this vaccine  A person who has ever had a life-threatening allergic reaction after a previous dose of any tetanus or diphtheria containing vaccine, OR has a severe  allergy to any part of this vaccine, should not get Td vaccine. Tell the person giving the vaccine about any severe allergies.  Talk to your doctor if you: ? had severe pain or swelling after any vaccine containing diphtheria or tetanus, ? ever had a condition called Guillain Barre Syndrome (GBS), ? aren't feeling well on the day the shot is scheduled. 4. What are the risks from Td vaccine? With any medicine, including vaccines, there is a chance of side effects. These are usually mild and go away on their own. Serious reactions are also possible but are rare. Most people who get Td vaccine do not have any problems with it. Mild problems following Td vaccine: (Did not interfere with activities)  Pain where the shot was given (about 8 people in 10)  Redness or swelling where the shot was given (about 1 person in 4)  Mild fever (rare)  Headache (about 1 person in 4)  Tiredness (about 1 person in 4)  Moderate problems following Td vaccine: (Interfered with activities, but did not require medical attention)  Fever over 102F (rare)  Severe problems following Td vaccine: (Unable to perform usual activities; required medical attention)  Swelling, severe pain, bleeding and/or redness in the arm where the shot was given (rare).  Problems that could happen after any vaccine:  People sometimes faint after a medical procedure, including vaccination. Sitting or lying down for about 15 minutes can help prevent fainting, and injuries caused by a fall. Tell your doctor if you feel dizzy, or have vision changes or ringing in the ears.  Some people get   severe pain in the shoulder and have difficulty moving the arm where a shot was given. This happens very rarely.  Any medication can cause a severe allergic reaction. Such reactions from a vaccine are very rare, estimated at fewer than 1 in a million doses, and would happen within a few minutes to a few hours after the vaccination. As with any  medicine, there is a very remote chance of a vaccine causing a serious injury or death. The safety of vaccines is always being monitored. For more information, visit: www.cdc.gov/vaccinesafety/ 5. What if there is a serious reaction? What should I look for? Look for anything that concerns you, such as signs of a severe allergic reaction, very high fever, or unusual behavior. Signs of a severe allergic reaction can include hives, swelling of the face and throat, difficulty breathing, a fast heartbeat, dizziness, and weakness. These would usually start a few minutes to a few hours after the vaccination. What should I do?  If you think it is a severe allergic reaction or other emergency that can't wait, call 9-1-1 or get the person to the nearest hospital. Otherwise, call your doctor.  Afterward, the reaction should be reported to the Vaccine Adverse Event Reporting System (VAERS). Your doctor might file this report, or you can do it yourself through the VAERS web site at www.vaers.hhs.gov, or by calling 1-800-822-7967. ? VAERS does not give medical advice. 6. The National Vaccine Injury Compensation Program The National Vaccine Injury Compensation Program (VICP) is a federal program that was created to compensate people who may have been injured by certain vaccines. Persons who believe they may have been injured by a vaccine can learn about the program and about filing a claim by calling 1-800-338-2382 or visiting the VICP website at www.hrsa.gov/vaccinecompensation. There is a time limit to file a claim for compensation. 7. How can I learn more?  Ask your doctor. He or she can give you the vaccine package insert or suggest other sources of information.  Call your local or state health department.  Contact the Centers for Disease Control and Prevention (CDC): ? Call 1-800-232-4636 (1-800-CDC-INFO) ? Visit CDC's website at www.cdc.gov/vaccines CDC Td Vaccine VIS (05/27/15) This information is  not intended to replace advice given to you by your health care provider. Make sure you discuss any questions you have with your health care provider. Document Released: 11/29/2005 Document Revised: 10/23/2015 Document Reviewed: 10/23/2015 Elsevier Interactive Patient Education  2017 Elsevier Inc.  

## 2017-02-10 NOTE — Progress Notes (Signed)
   Subjective:  Patient ID: Brandy Stewart, female    DOB: 01-Oct-1950  Age: 66 y.o. MRN: [id number]  CC: f/u   HPI  Brandy Stewart a 66 y.o.femalewith no significantmedical historypresents on f/u of false positive RPR. ANA subsequently ordered and resulted in positive with a positive dsDNA Ab and SSA Ab. Referred to Rheumatology but she was unable to afford consult or afford the travel to [facility]. Seen here nearly two months ago and was diagnosed with discoid lupus. Prescribed clobetasol 0.05% gel with very good improvement of her skin lesions. Does not endorse any other symptoms or complaints.    Outpatient Medications Prior to Visit  Medication Sig Dispense Refill  . acetaminophen (TYLENOL) 500 MG tablet Take 1,000 mg by mouth every 6 (six) hours as needed for mild pain.     No facility-administered medications prior to visit.      ROS Review of Systems  Constitutional: Negative for chills, fever and malaise/fatigue.  Eyes: Negative for blurred vision.  Respiratory: Negative for shortness of breath.   Cardiovascular: Negative for chest pain and palpitations.  Gastrointestinal: Negative for abdominal pain and nausea.  Genitourinary: Negative for dysuria and hematuria.  Musculoskeletal: Negative for joint pain and myalgias.  Skin: Positive for rash.  Neurological: Negative for tingling and headaches.  Psychiatric/Behavioral: Negative for depression. The patient is not nervous/anxious.     Objective:  BP (!) 145/90 (BP Location: Left Arm, Patient Position: Sitting, Cuff Size: Large)   Pulse 61   Temp 98.3 F (36.8 C) (Oral)   Wt 200 lb 12.8 oz (91.1 kg)   SpO2 100%   BMI 34.47 kg/m   BP/Weight 02/10/2017 12/23/2016 73/71/0626  Systolic BP 948 546 270  Diastolic BP 90 83 80  Wt. (Lbs) 200.8 192.2 195  BMI 34.47 32.99 33.47      Physical Exam  Constitutional: She is oriented to person, place, and time.  Well developed, well nourished, NAD, polite  HENT:   Head: Normocephalic and atraumatic.  Eyes: No scleral icterus.  Neck: Normal range of motion. Neck supple. No thyromegaly present.  Cardiovascular: Normal rate, regular rhythm and normal heart sounds.  Pulmonary/Chest: Effort normal and breath sounds normal.  Abdominal: Soft. Bowel sounds are normal. There is no tenderness.  Musculoskeletal: She exhibits no edema.  Neurological: She is alert and oriented to person, place, and time.  Skin: Skin is warm and dry. No rash noted. No erythema. No pallor.  Skin lesions still present but significantly faded over previous visit. Some lesions are ovoid and finely scaled on the upper back. Upper chest with irregularly shaped finely scaled lesions.  Psychiatric: She has a normal mood and affect. Her behavior is normal. Thought content normal.  Vitals reviewed.    Assessment & Plan:    1. Discoid lupus - Patient unable to afford or travel to rheumatologist. She has planned to bring me a form for medicaid coverage to ease her financial burden for medical exenses. Will refer to rheumatology again once her insurance or financial assistance is approved.  - Normal Calcitriol and G6PD - Refill Clobetasol.  - Patient declines use of Plaquenil.

## 2017-02-16 ENCOUNTER — Telehealth (INDEPENDENT_AMBULATORY_CARE_PROVIDER_SITE_OTHER): Payer: Self-pay | Admitting: Physician Assistant

## 2017-02-16 NOTE — Telephone Encounter (Signed)
Patients last vit D level was taken in November and was a 19, please advise if patient should take or does she need to be retested prior to. Nat Christen, CMA

## 2017-02-16 NOTE — Telephone Encounter (Signed)
Patient called to ask tempesstt  If is ok to take her Vitamin D . Please, call her back  Thank You

## 2017-02-22 ENCOUNTER — Ambulatory Visit: Payer: Self-pay

## 2017-03-10 ENCOUNTER — Ambulatory Visit (INDEPENDENT_AMBULATORY_CARE_PROVIDER_SITE_OTHER): Payer: Self-pay | Admitting: Physician Assistant

## 2017-03-16 ENCOUNTER — Ambulatory Visit (INDEPENDENT_AMBULATORY_CARE_PROVIDER_SITE_OTHER): Payer: Self-pay | Admitting: Physician Assistant

## 2017-03-16 ENCOUNTER — Other Ambulatory Visit (HOSPITAL_COMMUNITY)
Admission: RE | Admit: 2017-03-16 | Discharge: 2017-03-16 | Disposition: A | Discharge status: Home / Self Care | Payer: Self-pay | Source: Ambulatory Visit | Attending: Physician Assistant | Admitting: Physician Assistant

## 2017-03-16 ENCOUNTER — Encounter (INDEPENDENT_AMBULATORY_CARE_PROVIDER_SITE_OTHER): Payer: Self-pay | Admitting: Physician Assistant

## 2017-03-16 VITALS — BP 139/79 | HR 68 | Temp 98.4°F | Resp 18 | Ht 64.0 in | Wt 206.0 lb

## 2017-03-16 DIAGNOSIS — Z1231 Encounter for screening mammogram for malignant neoplasm of breast: Secondary | ICD-10-CM

## 2017-03-16 DIAGNOSIS — Z1239 Encounter for other screening for malignant neoplasm of breast: Secondary | ICD-10-CM

## 2017-03-16 DIAGNOSIS — K5900 Constipation, unspecified: Secondary | ICD-10-CM

## 2017-03-16 DIAGNOSIS — Z124 Encounter for screening for malignant neoplasm of cervix: Secondary | ICD-10-CM | POA: Insufficient documentation

## 2017-03-16 MED ORDER — SENNOSIDES-DOCUSATE SODIUM 8.6-50 MG PO TABS
1.0000 | ORAL_TABLET | Freq: Every day | ORAL | 0 refills | Status: DC
Start: 2017-03-16 — End: 2017-05-13

## 2017-03-16 NOTE — Progress Notes (Signed)
Subjective:  Patient ID: Brandy Stewart, female    DOB: May 29, 1950  Age: 67 y.o. MRN: [id number]  CC:   HPI  Cloee Stewart a 67 y.o.femalewith amedical history of discoid lupuspresents on f/u of false positive RPR. ANA subsequently ordered and resulted in positive with a positive dsDNA Ab and SSA Ab. Referred to Rheumatology but she was unable to afford consult or afford the travel to [place]. Seen here nearly three months ago and was diagnosed with discoid lupus. Prescribed clobetasol 0.05% gel with very good improvement of her skin lesions. Does not endorse any other symptoms or complaints.     Outpatient Medications Prior to Visit  Medication Sig Dispense Refill  . acetaminophen (TYLENOL) 500 MG tablet Take 1,000 mg by mouth every 6 (six) hours as needed for mild pain.    . clobetasol (TEMOVATE) 0.05 % GEL Apply 1 application topically 2 (two) times daily. 1 each 3   No facility-administered medications prior to visit.      ROS Review of Systems  Constitutional: Negative for chills, fever and malaise/fatigue.  Eyes: Negative for blurred vision.  Respiratory: Negative for shortness of breath.   Cardiovascular: Negative for chest pain and palpitations.  Gastrointestinal: Negative for abdominal pain and nausea.  Genitourinary: Negative for dysuria and hematuria.  Musculoskeletal: Negative for joint pain and myalgias.  Skin: Negative for rash.  Neurological: Negative for tingling and headaches.  Psychiatric/Behavioral: Negative for depression. The patient is not nervous/anxious.     Objective:  BP 139/79 (BP Location: Left Arm, Patient Position: Sitting, Cuff Size: Large)   Pulse 68   Temp 98.4 F (36.9 C) (Oral)   Resp 18   Ht 5\' 4"  (1.626 m)   Wt 206 lb (93.4 kg)   SpO2 100%   BMI 35.36 kg/m   BP/Weight 03/16/2017 02/10/2017 35/0/0938  Systolic BP 182 993 716  Diastolic BP 79 90 83  Wt. (Lbs) 206 200.8 192.2  BMI 35.36 34.47 32.99      Physical  Exam  Constitutional: She is oriented to person, place, and time.  Well developed, well nourished, NAD, polite  HENT:  Head: Normocephalic and atraumatic.  Eyes: No scleral icterus.  Neck: Normal range of motion. Neck supple. No thyromegaly present.  Cardiovascular: Normal rate, regular rhythm and normal heart sounds.  Pulmonary/Chest: Effort normal and breath sounds normal.  Abdominal: Soft. Bowel sounds are normal. There is no tenderness.  Genitourinary:  Genitourinary Comments: Atrophic vaginal walls, cervix somewhat friable, no adnexal tenderness or mass bilaterally, no uterine tenderness or mass.  Musculoskeletal: She exhibits no edema.  Neurological: She is alert and oriented to person, place, and time.  Skin: Skin is warm and dry. No rash noted. No erythema. No pallor.  Psychiatric: She has a normal mood and affect. Her behavior is normal. Thought content normal.  Vitals reviewed.    Assessment & Plan:   1. Screening for cervical cancer - Cytology - PAP([facility])  2. Screening for breast cancer - MS DIGITAL SCREENING BILATERAL; Future  3. Constipation, unspecified constipation type - senna-docusate (SENOKOT-S) 8.6-50 MG tablet; Take 1 tablet by mouth daily.  Dispense: 30 tablet; Refill: 0   Meds ordered this encounter  Medications  . senna-docusate (SENOKOT-S) 8.6-50 MG tablet    Sig: Take 1 tablet by mouth daily.    Dispense:  30 tablet    Refill:  0    Order Specific Question:   Supervising Provider    Answer:   Tresa Garter [id number]  Follow-up: Return in about 6 months (around 09/13/2017) for Discoid lupus.   Clent Demark PA

## 2017-03-16 NOTE — Patient Instructions (Addendum)
Community Resources  Advocacy/Legal Legal Aid [place]:  1-866-219-5262  /  336-272-0148  Family Justice Center:  336-641-7233  [facility] [place] 24-hr Crisis line:  336-273-7273  Women's Resource Center, [place]:  336-275-6090  Court Watch (custody):  336-275-2346  [place] [facility]:   336-279-9299    Baby & Breastfeeding Car Seat Inspection @ Various [place] Fire Depts.- call 336-373-2177  [place] [facility]  336-832-6860  [facility] 336-878-6712  WIC: 336-641-3663 ([place]);  336-641-7571 ([place])  La Leche League:  1-877-452-5321   Childcare [place] [facility]: 336-369-5097 ([place]) / 336-887-8224 ([place])  - Child Care Resources/ Referrals/ Scholarships  - Head Start/ Early Head Start (call or apply online)  [place] DHHS: [place] Pre-K :  1-800-859-0829 / 336-274-5437   Employment / Job Search [facility] [place]: 336-275-6090 / [address]  [place] [employer] (JobLink): 336-373-5922 ([place]) / 336-882-4141 ([place])  [facility] Community Resource/ Career Center: 336-275-9801 / 336-282-7307  [facility] Job & Career Center: 336-373-3764  DHHS Work First: 336-641-3447 ([place]) / 336-641-3447 ([place])  [facility] [place]:  336-676-5871   Financial Assistance [place] [facility]:  336-553-2657  [employer]: 336-235-0368  [employer] (furniture):  336-370-4002  [place] [facility]: 336-373-4264  Low Income Energy Assistance  336-641-3000   Food Assistance DHHS- SNAP/ Food Stamps: 336-641-4588  WIC: [place]- 336-641-3663 ;  [place] 336-641-7571  Little Green Book- Free Meals  Little Blue Book- Free Food Pantries  During the summer, text "FOOD" to 877877   General Health / Clinics (Adults) Orange Card (for Adults) through [facility]: (336) 895-4900  [facility]:   336-832-8035  [facility] [facility]:   336-832-4444  Health Department:  336-641-3245  [facility]:  336-415-3877 / 336-641-2100  Planned [facility] of [place]:   336-373-0678  [facility]:   336-334-4822 x 50251   Housing [place] Housing Coalition:   336-691-9521  [place] Housing Authority:  336-275-8501  Affordable Housing Managemnt:  336-273-0568   Immigrant/ Refugee Center for [place]ians (UNCG):  336-256-1065  Faith [employer] [facility]:  336-379-0037  [employer]:  336-937-4701  Church World Services:  336-617-0381  African Services Coalition:  336-574-2677   LGBTQ YouthSAFE  www.youthsafe[place].org  PFLAG  336-541-6754 / info@[place].org  The Trevor Project:  1-866-488-7386   Mental Health/ Substance Use [facility]  336-387-6161  [facility]:  336-832-9700 or 1-800-711-2635  [facility] Circle of Care:  336-271-5888  [facility]:  336-294-1349  [facility]:  336-542-2884  [facility] (walk-ins)  336-676-6840 / [address]  [place]:  800-449-1287  Alcoholics Anonymous:  336-854-4278  Narcotics Anonymous:  800-365-1036  Quit Smoking Hotline:  800-QUIT-NOW (800-784-8669)   Parenting Children's Home Society:  800-632-1400  [place]: Education Center & Support Groups:  336-832-6682  YWCA: 336-273-3461  [employer]CG: Bringing Out the Best:  336-334-3120               Thriving at Three (Hispanic families): 336-256-1066  Healthy Start ([facility]):  336-387-6161 x2288  Parents as Teachers:  336-691-0024  [place] [facility]- Learning Together (Immigrants): 336-369-5001   Poison Control 800-222-1222  Sports & Recreation [employer] Open Doors Application: ymcanwnc.org/join/open-doors-financial-assistance/  City of [place] Recreation Centers: http://www.[place].gov/index.aspx?page=3615   Special Needs Family Support Network:  336-832-6507  Autism Society of [place]:   336-333-0197 x1402 or x1412 /  800-785-1035  TEACCH :  336-334-5773     ARC of [place]:  336-373-1076  Children's Developmental Service Agency (CDSA):  336-334-5601  CC4C (Care Coordination for Children):  336-641-7641   Transportation Medicaid Transportation: 336-641-4848 to apply  [place] Transit Authority: 336-335-6499 (reduced-fare bus ID to Medicaid/ Medicare/ Orange Card)  SCAT Paratransit services: Eligible riders only, call 336-333-6589 for application   Tutoring/Mentoring Black Child Development Institute: 336-230-2138  Big Brothers/ Big Sisters: 336-378-9100 ([place])  336-882-4167 ([place])  ACES through child's school: 336-370-2321  [employer] Achievers: contact your local Y  SHIELD Mentor Program: 336-337-2771   

## 2017-03-22 ENCOUNTER — Encounter (INDEPENDENT_AMBULATORY_CARE_PROVIDER_SITE_OTHER): Payer: Self-pay | Admitting: Physician Assistant

## 2017-03-22 ENCOUNTER — Telehealth (INDEPENDENT_AMBULATORY_CARE_PROVIDER_SITE_OTHER): Payer: Self-pay | Admitting: *Deleted

## 2017-03-22 ENCOUNTER — Ambulatory Visit (INDEPENDENT_AMBULATORY_CARE_PROVIDER_SITE_OTHER): Payer: Self-pay | Admitting: Physician Assistant

## 2017-03-22 ENCOUNTER — Telehealth (INDEPENDENT_AMBULATORY_CARE_PROVIDER_SITE_OTHER): Payer: Self-pay | Admitting: Physician Assistant

## 2017-03-22 ENCOUNTER — Ambulatory Visit: Payer: Self-pay | Attending: Internal Medicine

## 2017-03-22 VITALS — BP 126/77 | HR 98 | Temp 99.3°F | Resp 18 | Ht 64.0 in | Wt 203.0 lb

## 2017-03-22 DIAGNOSIS — R079 Chest pain, unspecified: Secondary | ICD-10-CM

## 2017-03-22 DIAGNOSIS — R1012 Left upper quadrant pain: Secondary | ICD-10-CM

## 2017-03-22 DIAGNOSIS — R109 Unspecified abdominal pain: Secondary | ICD-10-CM

## 2017-03-22 LAB — CYTOLOGY - PAP
Bacterial vaginitis: NEGATIVE
Candida vaginitis: NEGATIVE
Chlamydia: NEGATIVE
Diagnosis: UNDETERMINED — AB
HPV (WINDOPATH): NOT DETECTED
Neisseria Gonorrhea: NEGATIVE
TRICH (WINDOWPATH): NEGATIVE

## 2017-03-22 MED ORDER — RANITIDINE HCL 150 MG PO TABS: 150.0000 mg | ORAL_TABLET | Freq: Two times a day (BID) | ORAL | 0 refills | Status: DC

## 2017-03-22 MED FILL — raNITIdine HCL 150 MG TABS: 150 | 15 days supply | Qty: 30 | Fill #0

## 2017-03-22 NOTE — Patient Instructions (Signed)

## 2017-03-22 NOTE — Telephone Encounter (Signed)
Patient is requesting a medication for gas, please advise.

## 2017-03-22 NOTE — Telephone Encounter (Signed)
Pt may try OTC Gas X. If she still has symptoms then she will need evaluation.

## 2017-03-22 NOTE — Progress Notes (Signed)
Subjective:  Patient ID: Brandy Stewart, female    DOB: [birth year]  Age: 67 y.o. MRN: [id number]  CC: abdominal pain   HPI  Camani Sesay a 67 y.o.femalewith amedical history of discoid lupuspresents and heartburn with one day history of LUQ abdominal pain associated with "burning" and some flatulence. Pain radiates up to left side of chest, left shoulder, and left forearm. Pain was worse last night. Thinks the spicy food she ate last night may have caused the pain. Says she never eats spicy foods. Burning pain similar to previous episodes of heartburn. Has not taken any antacids for relief. Tried OTC gas relief with no relief of pain but thinks pain has subsided overall. There are no other associated symptoms or other complaints.      Outpatient Medications Prior to Visit  Medication Sig Dispense Refill  . clobetasol (TEMOVATE) 0.05 % GEL Apply 1 application topically 2 (two) times daily. 1 each 3  . senna-docusate (SENOKOT-S) 8.6-50 MG tablet Take 1 tablet by mouth daily. 30 tablet 0  . simethicone (MYLICON) 80 MG chewable tablet Chew 80 mg by mouth every 6 (six) hours as needed for flatulence.     No facility-administered medications prior to visit.      ROS ROS  Objective:  BP 126/77 (BP Location: Left Arm, Patient Position: Sitting, Cuff Size: Normal)   Pulse 98   Temp 99.3 F (37.4 C) (Oral)   Resp 18   Ht 5\' 4"  (1.626 m)   Wt 203 lb (92.1 kg)   SpO2 97%   BMI 34.84 kg/m   BP/Weight 03/22/2017 03/16/2017 82/50/5397  Systolic BP 673 419 379  Diastolic BP 77 79 90  Wt. (Lbs) 203 206 200.8  BMI 34.84 35.36 34.47   Review of Systems  Constitutional: Negative for chills, fever and malaise/fatigue.  Eyes: Negative for blurred vision.  Respiratory: Negative for shortness of breath.   Cardiovascular: Positive for chest pain. Negative for palpitations.  Gastrointestinal: Positive for abdominal pain. Negative for blood in stool, constipation, diarrhea, melena,  nausea and vomiting.  Genitourinary: Negative for dysuria and hematuria.  Musculoskeletal: Positive for joint pain. Negative for myalgias.  Skin: Negative for rash.  Neurological: Negative for tingling and headaches.  Psychiatric/Behavioral: Negative for depression. The patient is not nervous/anxious.       Physical Exam  Physical Exam  Constitutional: She is oriented to person, place, and time and well-developed, well-nourished, and in no distress.  HENT:  Head: Normocephalic and atraumatic.  Eyes: Conjunctivae are normal. No scleral icterus.  Neck: No thyromegaly present.  Cardiovascular: Normal rate, regular rhythm and normal heart sounds. Exam reveals no gallop and no friction rub.  No murmur heard. No LE edema bilaterally  Pulmonary/Chest: Effort normal and breath sounds normal. No respiratory distress. She has no wheezes.  Abdominal: Soft. Bowel sounds are normal. She exhibits no distension. There is tenderness (Mild TTP along the epigastrium and LUQ. ).  Musculoskeletal: She exhibits no edema.  Lymphadenopathy:    She has no cervical adenopathy.  Neurological: She is alert and oriented to person, place, and time. No cranial nerve deficit. Gait normal. Coordination normal.  Skin: Skin is warm and dry.  No dermatomal rash on abdomen and back  Psychiatric: Mood and affect normal.   Assessment & Plan:    1. LUQ pain - EKG 12-Lead NSR - Troponin I - CBC with Differential - Comprehensive metabolic panel - Lipase - ranitidine (ZANTAC) 150 MG tablet; Take 1 tablet (150 mg total) by  mouth 2 (two) times daily.  Dispense: 30 tablet; Refill: 0  2. Chest pain, unspecified type - EKG 12-Lead NSR - Troponin I - CBC with Differential - Comprehensive metabolic panel - Lipase - ranitidine (ZANTAC) 150 MG tablet; Take 1 tablet (150 mg total) by mouth 2 (two) times daily.  Dispense: 30 tablet; Refill: 0  3. Left flank pain - EKG 12-Lead NSR - Troponin I - CBC with  Differential - Comprehensive metabolic panel - Lipase - ranitidine (ZANTAC) 150 MG tablet; Take 1 tablet (150 mg total) by mouth 2 (two) times daily.  Dispense: 30 tablet; Refill: 0   Meds ordered this encounter  Medications  . ranitidine (ZANTAC) 150 MG tablet    Sig: Take 1 tablet (150 mg total) by mouth 2 (two) times daily.    Dispense:  30 tablet    Refill:  0    Order Specific Question:   Supervising Provider    Answer:   Tresa Garter [id number]    Follow-up: Return if symptoms worsen or fail to improve, for Will call.   Clent Demark PA

## 2017-03-22 NOTE — Telephone Encounter (Signed)
Patient verified DOB Patient is aware of PCP advising OTC gas x to address excessive gas symptoms. Patient states she has tried OTC gas relief with no relief provided. Patient was transferred to the front desk to schedule an evaluation appointment.

## 2017-03-22 NOTE — Telephone Encounter (Signed)
Pt called to request a prescription since she having real bad gas if you auth please sent it to [facility] pharmacy

## 2017-03-22 NOTE — Telephone Encounter (Signed)
Patient was seen in the office and results were discussed in person.

## 2017-03-22 NOTE — Telephone Encounter (Signed)
-----   Message from Clent Demark, PA-C sent at 03/22/2017 12:15 PM EST ----- PAP shows atypical cells but HPV is negative. She should repeat PAP in one year. Negative for other bacteria and yeast.

## 2017-03-23 ENCOUNTER — Telehealth (INDEPENDENT_AMBULATORY_CARE_PROVIDER_SITE_OTHER): Payer: Self-pay | Admitting: *Deleted

## 2017-03-23 LAB — CBC WITH DIFFERENTIAL/PLATELET
BASOS ABS: 0 10*3/uL (ref 0.0–0.2)
Basos: 0 %
EOS (ABSOLUTE): 0 10*3/uL (ref 0.0–0.4)
EOS: 0 %
HEMOGLOBIN: 12.8 g/dL (ref 11.1–15.9)
Hematocrit: 38.5 % (ref 34.0–46.6)
IMMATURE GRANULOCYTES: 0 %
Immature Grans (Abs): 0 10*3/uL (ref 0.0–0.1)
LYMPHS ABS: 2.1 10*3/uL (ref 0.7–3.1)
LYMPHS: 49 %
MCH: 30.3 pg (ref 26.6–33.0)
MCHC: 33.2 g/dL (ref 31.5–35.7)
MCV: 91 fL (ref 79–97)
Monocytes Absolute: 0.5 10*3/uL (ref 0.1–0.9)
Monocytes: 13 %
NEUTROS PCT: 38 %
Neutrophils Absolute: 1.6 10*3/uL (ref 1.4–7.0)
Platelets: 262 10*3/uL (ref 150–379)
RBC: 4.23 x10E6/uL (ref 3.77–5.28)
RDW: 13.4 % (ref 12.3–15.4)
WBC: 4.3 10*3/uL (ref 3.4–10.8)

## 2017-03-23 LAB — COMPREHENSIVE METABOLIC PANEL
ALBUMIN: 4.4 g/dL (ref 3.6–4.8)
ALK PHOS: 97 IU/L (ref 39–117)
ALT: 15 IU/L (ref 0–32)
AST: 22 IU/L (ref 0–40)
Albumin/Globulin Ratio: 1.1 — ABNORMAL LOW (ref 1.2–2.2)
BUN / CREAT RATIO: 17 (ref 12–28)
BUN: 12 mg/dL (ref 8–27)
Bilirubin Total: <0.2 mg/dL (ref 0.0–1.2)
CALCIUM: 9.4 mg/dL (ref 8.7–10.3)
CO2: 23 mmol/L (ref 20–29)
CREATININE: 0.7 mg/dL (ref 0.57–1.00)
Chloride: 99 mmol/L (ref 96–106)
GFR calc Af Amer: 106 mL/min/{1.73_m2} (ref >59)
GFR, EST NON AFRICAN AMERICAN: 92 mL/min/{1.73_m2} (ref >59)
GLUCOSE: 119 mg/dL — AB (ref 65–99)
Globulin, Total: 4 g/dL (ref 1.5–4.5)
Potassium: 4.6 mmol/L (ref 3.5–5.2)
Sodium: 140 mmol/L (ref 134–144)
Total Protein: 8.4 g/dL (ref 6.0–8.5)

## 2017-03-23 LAB — TROPONIN I: Troponin I: <0.01 ng/mL (ref 0.00–0.04)

## 2017-03-23 LAB — LIPASE: Lipase: 40 U/L (ref 14–72)

## 2017-03-23 NOTE — Telephone Encounter (Signed)
Medical Assistant left message on patient's home and cell voicemail. Voicemail states to give a call back to [place] with [facility] at [phone]. Patient is aware of labs being normal including cardiac markers. Pancreas, liver, kidney and no infection/inflammation. Patient advised to continue with current medications to treat the heartburn.

## 2017-03-23 NOTE — Telephone Encounter (Signed)
-----   Message from Clent Demark, PA-C sent at 03/23/2017 12:35 PM EST ----- Cardiac marker negative. Pancreas normal. No infection or inflammation. Liver normal. Renal function normal. EKG was normal. Patient may have heartburn.

## 2017-05-06 ENCOUNTER — Other Ambulatory Visit: Payer: Self-pay

## 2017-05-06 ENCOUNTER — Ambulatory Visit (INDEPENDENT_AMBULATORY_CARE_PROVIDER_SITE_OTHER): Payer: Self-pay | Admitting: Physician Assistant

## 2017-05-06 ENCOUNTER — Encounter (INDEPENDENT_AMBULATORY_CARE_PROVIDER_SITE_OTHER): Payer: Self-pay | Admitting: Physician Assistant

## 2017-05-06 VITALS — BP 146/86 | HR 62 | Temp 98.0°F | Wt 211.0 lb

## 2017-05-06 DIAGNOSIS — J069 Acute upper respiratory infection, unspecified: Secondary | ICD-10-CM

## 2017-05-06 DIAGNOSIS — N3000 Acute cystitis without hematuria: Secondary | ICD-10-CM

## 2017-05-06 LAB — POCT URINALYSIS DIPSTICK
Bilirubin, UA: NEGATIVE
Blood, UA: NEGATIVE
Glucose, UA: NEGATIVE
Ketones, UA: NEGATIVE
NITRITE UA: NEGATIVE
PROTEIN UA: NEGATIVE
SPEC GRAV UA: 1.02 (ref 1.010–1.025)
Urobilinogen, UA: 0.2 E.U./dL
pH, UA: 8 (ref 5.0–8.0)

## 2017-05-06 MED ORDER — CIPROFLOXACIN HCL 500 MG PO TABS: 500.0000 mg | ORAL_TABLET | Freq: Two times a day (BID) | ORAL | 0 refills | Status: AC

## 2017-05-06 MED ORDER — PHENYLEPHRINE-DM-GG-APAP 5-10-200-325 MG/10ML PO LIQD: 20.0000 mL | ORAL | 0 refills | Status: AC

## 2017-05-06 MED FILL — CIPROFLOXACIN HCL 500 MG TA: 500 | 3 days supply | Qty: 6 | Fill #0

## 2017-05-06 NOTE — Patient Instructions (Signed)

## 2017-05-06 NOTE — Progress Notes (Signed)
Subjective:  Patient ID: Brandy Stewart, female    DOB: Oct 21, 1950  Age: 67 y.o. MRN: [id number]  CC: left flank pain, urinary frequency   HPI Brandy Stewart a 67 y.o.femalewithamedical history of discoid lupuspresents with 5-6 day hx of malaise, nasal congestion, mild sore throat, urinary frequency, suprapubic pressure, and left flank pain. Has not taken anything for relief. No close contacts with the same. Does not endorse any other symptoms or complaints.        Outpatient Medications Prior to Visit  Medication Sig Dispense Refill  . ranitidine (ZANTAC) 150 MG tablet Take 1 tablet (150 mg total) by mouth 2 (two) times daily. 30 tablet 0  . clobetasol (TEMOVATE) 0.05 % GEL Apply 1 application topically 2 (two) times daily. (Patient not taking: Reported on 05/06/2017) 1 each 3  . senna-docusate (SENOKOT-S) 8.6-50 MG tablet Take 1 tablet by mouth daily. (Patient not taking: Reported on 05/06/2017) 30 tablet 0  . simethicone (MYLICON) 80 MG chewable tablet Chew 80 mg by mouth every 6 (six) hours as needed for flatulence.     No facility-administered medications prior to visit.      ROS Review of Systems  Constitutional: Positive for malaise/fatigue. Negative for chills and fever.  HENT: Positive for sore throat.   Eyes: Negative for blurred vision.  Respiratory: Negative for shortness of breath.   Cardiovascular: Negative for chest pain and palpitations.  Gastrointestinal: Negative for nausea.       Flank pain  Genitourinary: Positive for frequency. Negative for dysuria and hematuria.  Musculoskeletal: Negative for joint pain and myalgias.  Skin: Negative for rash.  Neurological: Negative for tingling and headaches.  Psychiatric/Behavioral: Negative for depression. The patient is not nervous/anxious.     Objective:  BP (!) 146/86 (BP Location: Left Arm, Patient Position: Sitting, Cuff Size: Large)   Pulse 62   Temp 98 F (36.7 C) (Oral)   Wt 211 lb (95.7 kg)    SpO2 100%   BMI 36.22 kg/m   BP/Weight 05/06/2017 03/22/2017 5/36/1443  Systolic BP 154 008 676  Diastolic BP 86 77 79  Wt. (Lbs) 211 203 206  BMI 36.22 34.84 35.36      Physical Exam  Constitutional: She is oriented to person, place, and time.  Well developed, well nourished, NAD, polite  HENT:  Head: Normocephalic and atraumatic.  Mouth/Throat: No oropharyngeal exudate.  Mild erythema of the turbinates with mild congestion. No sinus TTP  Eyes: No scleral icterus.  Neck: Normal range of motion. Neck supple.  Cardiovascular: Normal rate, regular rhythm and normal heart sounds.  Pulmonary/Chest: Effort normal and breath sounds normal.  Abdominal: Soft. Bowel sounds are normal. There is tenderness (mild suprapubic and left flank TTP).  Musculoskeletal: She exhibits no edema.  Lymphadenopathy:    She has no cervical adenopathy.  Neurological: She is alert and oriented to person, place, and time. No cranial nerve deficit. Coordination normal.  Skin: Skin is warm and dry. No rash noted. No erythema. No pallor.  Psychiatric: She has a normal mood and affect. Her behavior is normal. Thought content normal.  Vitals reviewed.    Assessment & Plan:   1. Acute cystitis without hematuria - Urinalysis Dipstick with small leukocytes - Urine Culture - ciprofloxacin (CIPRO) 500 MG tablet; Take 1 tablet (500 mg total) by mouth 2 (two) times daily for 3 days.  Dispense: 6 tablet; Refill: 0  2. Acute upper respiratory infection - Phenylephrine-DM-GG-APAP (MUCINEX FAST-MAX COLD FLU) 5-10-200-325 MG/10ML LIQD; Take 20 mLs  by mouth every 4 (four) hours for 5 days.  Dispense: 1 Bottle; Refill: 0   Meds ordered this encounter  Medications  . ciprofloxacin (CIPRO) 500 MG tablet    Sig: Take 1 tablet (500 mg total) by mouth 2 (two) times daily for 3 days.    Dispense:  6 tablet    Refill:  0    Order Specific Question:   Supervising Provider    Answer:   Tresa Garter [id number]  .  Phenylephrine-DM-GG-APAP (MUCINEX FAST-MAX COLD FLU) 5-10-200-325 MG/10ML LIQD    Sig: Take 20 mLs by mouth every 4 (four) hours for 5 days.    Dispense:  1 Bottle    Refill:  0    Order Specific Question:   Supervising Provider    Answer:   Tresa Garter [id number]    Follow-up: Return if symptoms worsen or fail to improve.   Clent Demark PA

## 2017-05-07 LAB — URINE CULTURE: ORGANISM ID, BACTERIA: NO GROWTH

## 2017-05-09 ENCOUNTER — Telehealth (INDEPENDENT_AMBULATORY_CARE_PROVIDER_SITE_OTHER): Payer: Self-pay

## 2017-05-09 NOTE — Telephone Encounter (Signed)
Left message informing patient of no growth on urine culture. Nat Christen, CMA

## 2017-05-09 NOTE — Telephone Encounter (Signed)
-----   Message from Clent Demark, PA-C sent at 05/09/2017  8:53 AM EDT ----- No growth on urine culture.

## 2017-05-13 ENCOUNTER — Other Ambulatory Visit: Payer: Self-pay

## 2017-05-13 ENCOUNTER — Encounter (INDEPENDENT_AMBULATORY_CARE_PROVIDER_SITE_OTHER): Payer: Self-pay | Admitting: Physician Assistant

## 2017-05-13 ENCOUNTER — Ambulatory Visit (INDEPENDENT_AMBULATORY_CARE_PROVIDER_SITE_OTHER): Payer: Self-pay | Admitting: Physician Assistant

## 2017-05-13 VITALS — BP 159/83 | HR 66 | Temp 97.9°F | Ht 64.0 in | Wt 210.4 lb

## 2017-05-13 DIAGNOSIS — G933 Postviral fatigue syndrome: Secondary | ICD-10-CM

## 2017-05-13 DIAGNOSIS — J302 Other seasonal allergic rhinitis: Secondary | ICD-10-CM

## 2017-05-13 DIAGNOSIS — R1032 Left lower quadrant pain: Secondary | ICD-10-CM

## 2017-05-13 DIAGNOSIS — N760 Acute vaginitis: Secondary | ICD-10-CM

## 2017-05-13 DIAGNOSIS — G9331 Postviral fatigue syndrome: Secondary | ICD-10-CM

## 2017-05-13 LAB — POCT URINALYSIS DIPSTICK
Bilirubin, UA: NEGATIVE
Blood, UA: NEGATIVE
GLUCOSE UA: NEGATIVE
KETONES UA: NEGATIVE
NITRITE UA: NEGATIVE
Protein, UA: NEGATIVE
SPEC GRAV UA: 1.025 (ref 1.010–1.025)
Urobilinogen, UA: 0.2 E.U./dL
pH, UA: 6.5 (ref 5.0–8.0)

## 2017-05-13 MED ORDER — METRONIDAZOLE 500 MG PO TABS: 500.0000 mg | ORAL_TABLET | Freq: Two times a day (BID) | ORAL | 0 refills | Status: AC

## 2017-05-13 MED ORDER — FLUTICASONE PROPIONATE 50 MCG/ACT NA SUSP: 2.0000 | Freq: Every day | NASAL | 6 refills | Status: DC

## 2017-05-13 MED ORDER — CETIRIZINE HCL 10 MG PO TABS: 10.0000 mg | ORAL_TABLET | Freq: Every day | ORAL | 11 refills | Status: DC

## 2017-05-13 MED FILL — ?METRONIDAZOLE 500MG TABS: 500 | 7 days supply | Qty: 14 | Fill #0

## 2017-05-13 MED FILL — FLUTICASONE PROP 50 MCG SPR: 50 | 30 days supply | Qty: 16 | Fill #0

## 2017-05-13 NOTE — Patient Instructions (Signed)

## 2017-05-13 NOTE — Progress Notes (Signed)
Subjective:  Patient ID: Brandy Stewart, female    DOB: 07/18/1950  Age: 67 y.o. MRN: [id number]  CC: cough  HPI Hikari Tripp a 67 y.o.femalewithamedical history of discoid lupuspresents with cough and left flank pain. Seen here seven days ago and diagnosed with acute cystitis and upper respiratory infection. At the time she had symptoms for 5-6 days. Prescribed Mucinex and ciprofloxacin. Felt some relief with the Mucinex equivalent she bought at the pharmacy. Still has some pain in the hypogastric region/LLQ region. Was thought to have a UTI after small leuks at previous visit but urine cx had no growth. Pain is still the same as before. No dysuria, urinary frequency, or vaginal drainage.     Outpatient Medications Prior to Visit  Medication Sig Dispense Refill  . clobetasol (TEMOVATE) 0.05 % GEL Apply 1 application topically 2 (two) times daily. 1 each 3  . ranitidine (ZANTAC) 150 MG tablet Take 1 tablet (150 mg total) by mouth 2 (two) times daily. 30 tablet 0  . senna-docusate (SENOKOT-S) 8.6-50 MG tablet Take 1 tablet by mouth daily. (Patient not taking: Reported on 05/06/2017) 30 tablet 0  . simethicone (MYLICON) 80 MG chewable tablet Chew 80 mg by mouth every 6 (six) hours as needed for flatulence.     No facility-administered medications prior to visit.      ROS Review of Systems  Constitutional: Negative for chills, fever and malaise/fatigue.  Eyes: Negative for blurred vision.  Respiratory: Negative for shortness of breath.   Cardiovascular: Negative for chest pain and palpitations.  Gastrointestinal: Positive for abdominal pain. Negative for blood in stool, constipation, diarrhea, melena, nausea and vomiting.  Genitourinary: Negative for dysuria, flank pain, frequency, hematuria and urgency.  Musculoskeletal: Negative for joint pain and myalgias.  Skin: Negative for rash.  Neurological: Negative for tingling and headaches.  Psychiatric/Behavioral: Negative for  depression. The patient is not nervous/anxious.     Objective:  BP (!) 159/83 (BP Location: Left Arm, Patient Position: Sitting, Cuff Size: Large)   Pulse 66   Temp 97.9 F (36.6 C) (Oral)   Ht 5\' 4"  (1.626 m)   Wt 210 lb 6.4 oz (95.4 kg)   SpO2 98%   BMI 36.12 kg/m   BP/Weight 05/13/2017 9/38/1017 06/15/256  Systolic BP 527 782 423  Diastolic BP 83 86 77  Wt. (Lbs) 210.4 211 203  BMI 36.12 36.22 34.84      Physical Exam  Constitutional: She is oriented to person, place, and time.  Well developed, well nourished, NAD, polite  HENT:  Head: Normocephalic and atraumatic.  Eyes: No scleral icterus.  Neck: Normal range of motion. Neck supple. No thyromegaly present.  Cardiovascular: Normal rate, regular rhythm and normal heart sounds.  Pulmonary/Chest: Effort normal and breath sounds normal. No respiratory distress. She has no wheezes.  Abdominal: Soft. Bowel sounds are normal. There is tenderness (mild TTP in the hypogastric and left adnexal region. ).  Musculoskeletal: She exhibits no edema.  Neurological: She is alert and oriented to person, place, and time. No cranial nerve deficit. Coordination normal.  Skin: Skin is warm and dry. No rash noted. No erythema. No pallor.  Psychiatric: She has a normal mood and affect. Her behavior is normal. Thought content normal.  Vitals reviewed.    Assessment & Plan:   1. Post viral syndrome - cetirizine (ZYRTEC) 10 MG tablet; Take 1 tablet (10 mg total) by mouth daily.  Dispense: 30 tablet; Refill: 11 - fluticasone (FLONASE) 50 MCG/ACT nasal spray; Place 2  sprays into both nostrils daily.  Dispense: 16 g; Refill: 6  2. Seasonal allergies - cetirizine (ZYRTEC) 10 MG tablet; Take 1 tablet (10 mg total) by mouth daily.  Dispense: 30 tablet; Refill: 11 - fluticasone (FLONASE) 50 MCG/ACT nasal spray; Place 2 sprays into both nostrils daily.  Dispense: 16 g; Refill: 6  3. Left lower quadrant pain - Urinalysis Dipstick small; same as  previous dipstick. Urine cx had revealed no growth.   4. Acute vaginitis - metroNIDAZOLE (FLAGYL) 500 MG tablet; Take 1 tablet (500 mg total) by mouth 2 (two) times daily for 7 days.  Dispense: 14 tablet; Refill: 0 - Will consider US pelvic and transvaginal should her symptoms persist.     Meds ordered this encounter  Medications  . cetirizine (ZYRTEC) 10 MG tablet    Sig: Take 1 tablet (10 mg total) by mouth daily.    Dispense:  30 tablet    Refill:  11    Order Specific Question:   Supervising Provider    Answer:   Tresa Garter [id number]  . fluticasone (FLONASE) 50 MCG/ACT nasal spray    Sig: Place 2 sprays into both nostrils daily.    Dispense:  16 g    Refill:  6    Order Specific Question:   Supervising Provider    Answer:   Tresa Garter [id number]  . metroNIDAZOLE (FLAGYL) 500 MG tablet    Sig: Take 1 tablet (500 mg total) by mouth 2 (two) times daily for 7 days.    Dispense:  14 tablet    Refill:  0    Order Specific Question:   Supervising Provider    Answer:   Tresa Garter [id number]    Follow-up: Return if symptoms worsen or fail to improve.   Clent Demark PA

## 2017-06-24 ENCOUNTER — Emergency Department (HOSPITAL_COMMUNITY)
Admission: EM | Admit: 2017-06-24 | Discharge: 2017-06-24 | Disposition: A | Discharge status: Home / Self Care | Payer: Self-pay | Attending: Emergency Medicine | Admitting: Emergency Medicine

## 2017-06-24 ENCOUNTER — Other Ambulatory Visit: Payer: Self-pay

## 2017-06-24 ENCOUNTER — Encounter (HOSPITAL_COMMUNITY): Payer: Self-pay | Admitting: Emergency Medicine

## 2017-06-24 ENCOUNTER — Emergency Department (HOSPITAL_COMMUNITY): Payer: Self-pay

## 2017-06-24 DIAGNOSIS — R1032 Left lower quadrant pain: Secondary | ICD-10-CM | POA: Insufficient documentation

## 2017-06-24 DIAGNOSIS — R918 Other nonspecific abnormal finding of lung field: Secondary | ICD-10-CM

## 2017-06-24 DIAGNOSIS — R911 Solitary pulmonary nodule: Secondary | ICD-10-CM | POA: Insufficient documentation

## 2017-06-24 HISTORY — DX: Leukemia, unspecified not having achieved remission: C95.90

## 2017-06-24 LAB — COMPREHENSIVE METABOLIC PANEL
ALT: 17 U/L (ref 14–54)
ANION GAP: 8 (ref 5–15)
AST: 29 U/L (ref 15–41)
Albumin: 3.5 g/dL (ref 3.5–5.0)
Alkaline Phosphatase: 79 U/L (ref 38–126)
BUN: 7 mg/dL (ref 6–20)
CALCIUM: 9 mg/dL (ref 8.9–10.3)
CHLORIDE: 103 mmol/L (ref 101–111)
CO2: 28 mmol/L (ref 22–32)
CREATININE: 0.71 mg/dL (ref 0.44–1.00)
Glucose, Bld: 96 mg/dL (ref 65–99)
Potassium: 4.4 mmol/L (ref 3.5–5.1)
SODIUM: 139 mmol/L (ref 135–145)
Total Bilirubin: 0.4 mg/dL (ref 0.3–1.2)
Total Protein: 7.8 g/dL (ref 6.5–8.1)

## 2017-06-24 LAB — URINALYSIS, ROUTINE W REFLEX MICROSCOPIC
Bilirubin Urine: NEGATIVE
GLUCOSE, UA: NEGATIVE mg/dL
HGB URINE DIPSTICK: NEGATIVE
KETONES UR: NEGATIVE mg/dL
Nitrite: NEGATIVE
PROTEIN: NEGATIVE mg/dL
Specific Gravity, Urine: 1.016 (ref 1.005–1.030)
pH: 6 (ref 5.0–8.0)

## 2017-06-24 LAB — CBC
HCT: 39.5 % (ref 36.0–46.0)
HEMOGLOBIN: 12.7 g/dL (ref 12.0–15.0)
MCH: 30.1 pg (ref 26.0–34.0)
MCHC: 32.2 g/dL (ref 30.0–36.0)
MCV: 93.6 fL (ref 78.0–100.0)
PLATELETS: 269 10*3/uL (ref 150–400)
RBC: 4.22 MIL/uL (ref 3.87–5.11)
RDW: 13.1 % (ref 11.5–15.5)
WBC: 3.6 10*3/uL — AB (ref 4.0–10.5)

## 2017-06-24 LAB — LIPASE, BLOOD: LIPASE: 52 U/L — AB (ref 11–51)

## 2017-06-24 MED ORDER — FENTANYL CITRATE (PF) 100 MCG/2ML IJ SOLN
50.0000 ug | Freq: Once | INTRAMUSCULAR | Status: AC
  Administered 2017-06-24: 50 ug via INTRAVENOUS
  Filled 2017-06-24: qty 2

## 2017-06-24 MED ORDER — TRAMADOL HCL 50 MG PO TABS: 50.0000 mg | ORAL_TABLET | Freq: Four times a day (QID) | ORAL | 0 refills | Status: DC | PRN

## 2017-06-24 MED ORDER — IOHEXOL 300 MG/ML  SOLN
100.0000 mL | Freq: Once | INTRAMUSCULAR | Status: AC | PRN
  Administered 2017-06-24: 100 mL via INTRAVENOUS

## 2017-06-24 MED ORDER — FAMOTIDINE 40 MG PO TABS: 40.0000 mg | ORAL_TABLET | Freq: Every day | ORAL | 0 refills | Status: DC

## 2017-06-24 MED ORDER — ONDANSETRON HCL 4 MG/2ML IJ SOLN
4.0000 mg | Freq: Once | INTRAMUSCULAR | Status: AC
  Administered 2017-06-24: 4 mg via INTRAVENOUS
  Filled 2017-06-24: qty 2

## 2017-06-24 MED ORDER — SODIUM CHLORIDE 0.9 % IV BOLUS
500.0000 mL | Freq: Once | INTRAVENOUS | Status: AC
  Administered 2017-06-24: 500 mL via INTRAVENOUS

## 2017-06-24 NOTE — ED Triage Notes (Signed)
Pt c/o LLQ pain that radiates to her back x months. Unable to see PCP until June. C/o burning with urination.

## 2017-06-24 NOTE — ED Notes (Signed)
Pt discharged from ED; instructions provided and scripts given; Pt encouraged to return to ED if symptoms worsen and to f/u with PCP; Pt verbalized understanding of all instructions 

## 2017-06-24 NOTE — Discharge Instructions (Addendum)
You have been seen in the Emergency Department (ED) for abdominal pain.  Your evaluation did not identify a clear cause of your symptoms but was generally reassuring.  Your CT scan did show some pulmonary nodules. Call your PCP to have a re-check in   Please follow up as instructed above regarding today?s emergent visit and the symptoms that are bothering you.  Return to the ED if your abdominal pain worsens or fails to improve, you develop bloody vomiting, bloody diarrhea, you are unable to tolerate fluids due to vomiting, fever greater than 101, or other symptoms that concern you.

## 2017-06-24 NOTE — ED Provider Notes (Signed)
Emergency Department Provider Note   I have reviewed the triage vital signs and the nursing notes.   HISTORY  Chief Complaint Abdominal Pain   HPI Brandy Stewart is a 67 y.o. female with PMH of discoid lupus to the emergency department for evaluation of continued left lower quadrant abdominal pain which seems to be worsening over the last week.  Patient states her symptoms have been present for the past 4 weeks.  She saw her primary care provider regarding the symptoms but denies any imaging for this problem.  She has no history of diverticulitis.  She had some associated mild constipation.  No nausea, vomiting, diarrhea.  No blood appreciated in the stools.  She denies any fevers or shaking chills.  He states her appetite has been good and the pain is nonradiating with no obvious modifying factors. Denies any CP or SOB.    Past Medical History:  Diagnosis Date  . Leukemia ([place])     There are no active problems to display for this patient.   Past Surgical History:  Procedure Laterality Date  . PARTIAL HYSTERECTOMY      Current Outpatient Rx  . Order #: [id number] Class: Normal  . Order #: [id number] Class: Normal  . Order #: [id number] Class: Print  . Order #: [id number] Class: Normal  . Order #: [id number] Class: Normal  . Order #: [id number] Class: Print    Allergies Penicillins  No family history on file.  Social History Social History   Tobacco Use  . Smoking status: Never Smoker  . Smokeless tobacco: Never Used  Substance Use Topics  . Alcohol use: No  . Drug use: No    Review of Systems  Constitutional: No fever/chills Eyes: No visual changes. ENT: No sore throat. Cardiovascular: Denies chest pain. Respiratory: Denies shortness of breath. Gastrointestinal: Positive LLQ abdominal pain.  No nausea, no vomiting.  No diarrhea. Positive mild constipation. Genitourinary: Negative for dysuria. Musculoskeletal: Negative for back pain. Skin: Negative for  rash. Neurological: Negative for headaches, focal weakness or numbness.  10-point ROS otherwise negative.  ____________________________________________   PHYSICAL EXAM:  VITAL SIGNS: ED Triage Vitals  Enc Vitals Group     BP 06/24/17 0637 (!) 145/73     Pulse Rate 06/24/17 0637 74     Resp 06/24/17 0637 16     Temp 06/24/17 0637 98 F (36.7 C)     Temp Source 06/24/17 0637 Oral     SpO2 06/24/17 0637 99 %     Pain Score 06/24/17 0639 4   Constitutional: Alert and oriented. Well appearing and in no acute distress. Eyes: Conjunctivae are normal.  Head: Atraumatic. Nose: No congestion/rhinnorhea. Mouth/Throat: Mucous membranes are moist.  Oropharynx non-erythematous. Neck: No stridor. Cardiovascular: Normal rate, regular rhythm. Good peripheral circulation. Grossly normal heart sounds.   Respiratory: Normal respiratory effort.  No retractions. Lungs CTAB. Gastrointestinal: Soft with focal LLQ abdominal pain. No rebound or guarding. No tenderness in other quadrants. No distention.  Musculoskeletal: No lower extremity tenderness nor edema. No gross deformities of extremities. Neurologic:  Normal speech and language. No gross focal neurologic deficits are appreciated.  Skin:  Skin is warm, dry and intact. No rash noted.   ____________________________________________   LABS (all labs ordered are listed, but only abnormal results are displayed)  Labs Reviewed  LIPASE, BLOOD - Abnormal; Notable for the following components:      Result Value   Lipase 52 (*)    All other components within normal limits  CBC - Abnormal; Notable for  the following components:   WBC 3.6 (*)    All other components within normal limits  URINALYSIS, ROUTINE W REFLEX MICROSCOPIC - Abnormal; Notable for the following components:   APPearance HAZY (*)    Leukocytes, UA LARGE (*)    Bacteria, UA RARE (*)    All other components within normal limits  COMPREHENSIVE METABOLIC PANEL    ____________________________________________  EKG   EKG Interpretation  Date/Time:  Friday Jun 24 2017 06:43:06 EDT Ventricular Rate:  75 PR Interval:  208 QRS Duration: 74 QT Interval:  370 QTC Calculation: 413 R Axis:   45 Text Interpretation:  Normal sinus rhythm Normal ECG No STEMI.  Confirmed by Nanda Quinton [phone]) on 06/24/2017 1:46:28 PM       ____________________________________________  RADIOLOGY  Ct Abdomen Pelvis W Contrast  Result Date: 06/24/2017 CLINICAL DATA:  Abdominal pain of the left lower quadrant EXAM: CT ABDOMEN AND PELVIS WITH CONTRAST TECHNIQUE: Multidetector CT imaging of the abdomen and pelvis was performed using the standard protocol following bolus administration of intravenous contrast. CONTRAST:  166mL OMNIPAQUE IOHEXOL 300 MG/ML  SOLN COMPARISON:  08/23/2016 and 07/17/2009 CT abdomen pelvis FINDINGS: LOWER CHEST: 4 mm right middle lobe pulmonary nodule is unchanged compared to 07/17/2009. There is a second nodule slightly more superiorly measuring 5 mm that was not previously imaged. HEPATOBILIARY: Normal hepatic contours and density. No intra- or extrahepatic biliary dilatation. Normal gallbladder. PANCREAS: Normal parenchymal contours without ductal dilatation. No peripancreatic fluid collection. SPLEEN: Normal. ADRENALS/URINARY TRACT: --Adrenal glands: Normal. --Right kidney/ureter: No hydronephrosis, nephroureterolithiasis, perinephric stranding or solid renal mass. --Left kidney/ureter: No hydronephrosis, nephroureterolithiasis, perinephric stranding or solid renal mass. --Urinary bladder: Normal for degree of distention STOMACH/BOWEL: --Stomach/Duodenum: No hiatal hernia or other gastric abnormality. Normal duodenal course. --Small bowel: No dilatation or inflammation. --Colon: No focal abnormality. --Appendix: Normal. VASCULAR/LYMPHATIC: Atherosclerotic calcification is present within the non-aneurysmal abdominal aorta, without hemodynamically  significant stenosis. The portal vein, splenic vein, superior mesenteric vein and IVC are patent. No abdominal or pelvic lymphadenopathy. REPRODUCTIVE: Status post hysterectomy. No adnexal mass. MUSCULOSKELETAL. No bony spinal canal stenosis or focal osseous abnormality. OTHER: None. IMPRESSION: 1. No acute abnormality of the abdomen or pelvis. 2. Multiple right middle lobe pulmonary nodules. The smaller, 4 mm nodule is unchanged compared to 07/17/2009 and therefore benign. The other, slightly larger nodule has not been previously imaged. No follow-up needed if patient is low-risk. Non-contrast chest CT can be considered in 12 months if patient is high-risk. This recommendation follows the consensus statement: Guidelines for Management of Incidental Pulmonary Nodules Detected on CT Images: From the Fleischner Society 2017; Radiology 2017; 284:228-243. Electronically Signed   By: Ulyses Jarred M.D.   On: 06/24/2017 15:25    ____________________________________________   PROCEDURES  Procedure(s) performed:   Procedures  None ____________________________________________   INITIAL IMPRESSION / ASSESSMENT AND PLAN / ED COURSE  Pertinent labs & imaging results that were available during my care of the patient were reviewed by me and considered in my medical decision making (see chart for details).  Patient presents to the emergency department for evaluation of left lower quadrant abdominal pain which been ongoing for the past 4 weeks.  She does have focal tenderness in this area.  No leukocytosis.  She saw her primary care provider for this on 3/19.  Her UA looks similar to today's sample and was sent to culture but there was no growth.  Patient denies any UTI symptoms at this time.  Doubt this is the source  of the patient's pain.  Given her age and persistent symptoms plan to obtain CT scan to rule out acute diverticulitis on the left.  No findings on exam or by history to raise my suspicion for  vascular etiology of symptoms.  CT imaging reviewed with no acute findings. Plan for pain control and PCP/GI follow up. Labs reviewed with no acute findings.   At this time, I do not feel there is any life-threatening condition present. I have reviewed and discussed all results (EKG, imaging, lab, urine as appropriate), exam findings with patient. I have reviewed nursing notes and appropriate previous records.  I feel the patient is safe to be discharged home without further emergent workup. Discussed usual and customary return precautions. Patient and family (if present) verbalize understanding and are comfortable with this plan.  Patient will follow-up with their primary care provider. If they do not have a primary care provider, information for follow-up has been provided to them. All questions have been answered.  ____________________________________________  FINAL CLINICAL IMPRESSION(S) / ED DIAGNOSES  Final diagnoses:  Left lower quadrant pain  Pulmonary nodules     MEDICATIONS GIVEN DURING THIS VISIT:  Medications  sodium chloride 0.9 % bolus 500 mL (0 mLs Intravenous Stopped 06/24/17 1431)  fentaNYL (SUBLIMAZE) injection 50 mcg (50 mcg Intravenous Given 06/24/17 1329)  ondansetron (ZOFRAN) injection 4 mg (4 mg Intravenous Given 06/24/17 1330)  iohexol (OMNIPAQUE) 300 MG/ML solution 100 mL (100 mLs Intravenous Contrast Given 06/24/17 1419)     NEW OUTPATIENT MEDICATIONS STARTED DURING THIS VISIT:  Discharge Medication List as of 06/24/2017  4:21 PM    START taking these medications   Details  famotidine (PEPCID) 40 MG tablet Take 1 tablet (40 mg total) by mouth daily., Starting Fri 06/24/2017, Until Sun 07/24/2017, Print    traMADol (ULTRAM) 50 MG tablet Take 1 tablet (50 mg total) by mouth every 6 (six) hours as needed., Starting Fri 06/24/2017, Print        Note:  This document was prepared using Dragon voice recognition software and may include unintentional dictation  errors.  Nanda Quinton, MD Emergency Medicine    Eliyah Bazzi, [facility], MD 06/24/17 2308

## 2017-06-27 ENCOUNTER — Ambulatory Visit (HOSPITAL_COMMUNITY)
Admission: EM | Admit: 2017-06-27 | Discharge: 2017-06-27 | Disposition: A | Discharge status: Home / Self Care | Payer: Self-pay | Attending: Family Medicine | Admitting: Family Medicine

## 2017-06-27 ENCOUNTER — Encounter (HOSPITAL_COMMUNITY): Payer: Self-pay | Admitting: Family Medicine

## 2017-06-27 DIAGNOSIS — H01005 Unspecified blepharitis left lower eyelid: Secondary | ICD-10-CM

## 2017-06-27 DIAGNOSIS — G9331 Postviral fatigue syndrome: Secondary | ICD-10-CM

## 2017-06-27 DIAGNOSIS — G933 Postviral fatigue syndrome: Secondary | ICD-10-CM

## 2017-06-27 DIAGNOSIS — J302 Other seasonal allergic rhinitis: Secondary | ICD-10-CM

## 2017-06-27 MED ORDER — CETIRIZINE HCL 10 MG PO TABS: 10.0000 mg | ORAL_TABLET | Freq: Every day | ORAL | 0 refills | Status: DC

## 2017-06-27 MED ORDER — ERYTHROMYCIN 5 MG/GM OP OINT: 1.0000 "application " | TOPICAL_OINTMENT | Freq: Four times a day (QID) | OPHTHALMIC | 0 refills | Status: AC

## 2017-06-27 MED FILL — ?CETIRIZINE HCL 10 MG TABLE: 10 | 30 days supply | Qty: 30 | Fill #0

## 2017-06-27 MED FILL — traMADol HCL 50 MG TABS: 50 | 3 days supply | Qty: 15 | Fill #0

## 2017-06-27 MED FILL — ?FAMOTIDINE 40 MG TABLETS: 40 MG | 30 days supply | Qty: 30 | Fill #0

## 2017-06-27 MED FILL — ERYTHROMYCIN 0.5% EYE OINT: 5 | 7 days supply | Qty: 4 | Fill #0

## 2017-06-27 NOTE — ED Provider Notes (Signed)
[facility]    CSN: [id number] Arrival date & time: 06/27/17  1002     History   Chief Complaint Chief Complaint  Patient presents with  . Eye Pain    HPI Brandy Stewart is a 67 y.o. female.   Brandy Stewart presents with complaints of left lower eyelid irritation, pain and swelling which she noted yesterday. Denies eye ball pain or vision change or loss. Does not have an eye doctor. Does not wear glasses or contacts. States she has had allergy symptoms as well but has ran out of allergy medications. Hx of cataracts. No previous surgery for this. No fevers. Has not tried any treatments for her symptoms. Hx leukemia.    ROS per HPI.      Past Medical History:  Diagnosis Date  . Leukemia ([place])     There are no active problems to display for this patient.   Past Surgical History:  Procedure Laterality Date  . PARTIAL HYSTERECTOMY      OB History   None      Home Medications    Prior to Admission medications   Medication Sig Start Date End Date Taking? Authorizing Provider  cetirizine (ZYRTEC) 10 MG tablet Take 1 tablet (10 mg total) by mouth daily. 06/27/17   Zigmund Gottron, NP  clobetasol (TEMOVATE) 0.05 % GEL Apply 1 application topically 2 (two) times daily. 02/10/17   Clent Demark, PA-C  erythromycin ophthalmic ointment Place 1 application into the left eye 4 (four) times daily for 7 days. 06/27/17 07/04/17  Zigmund Gottron, NP  famotidine (PEPCID) 40 MG tablet Take 1 tablet (40 mg total) by mouth daily. 06/24/17 07/24/17  Long, Wonda Olds, MD  fluticasone (FLONASE) 50 MCG/ACT nasal spray Place 2 sprays into both nostrils daily. 05/13/17   Clent Demark, PA-C  ranitidine (ZANTAC) 150 MG tablet Take 1 tablet (150 mg total) by mouth 2 (two) times daily. 03/22/17   Clent Demark, PA-C  traMADol (ULTRAM) 50 MG tablet Take 1 tablet (50 mg total) by mouth every 6 (six) hours as needed. 06/24/17   Long, Wonda Olds, MD    Family History History reviewed.  No pertinent family history.  Social History Social History   Tobacco Use  . Smoking status: Never Smoker  . Smokeless tobacco: Never Used  Substance Use Topics  . Alcohol use: No  . Drug use: No     Allergies   Penicillins   Review of Systems Review of Systems   Physical Exam Triage Vital Signs ED Triage Vitals  Enc Vitals Group     BP 06/27/17 1020 132/72     Pulse Rate 06/27/17 1020 68     Resp 06/27/17 1020 18     Temp 06/27/17 1020 97.9 F (36.6 C)     Temp src --      SpO2 06/27/17 1020 100 %     Weight --      Height --      Head Circumference --      Peak Flow --      Pain Score 06/27/17 1018 5     Pain Loc --      Pain Edu? --      Excl. in [place]? --    No data found.  Updated Vital Signs BP 132/72   Pulse 68   Temp 97.9 F (36.6 C)   Resp 18   SpO2 100%   Physical Exam  Constitutional: She is oriented to person,  place, and time. She appears well-developed and well-nourished. No distress.  HENT:  Head: Normocephalic and atraumatic.  Right Ear: Tympanic membrane, external ear and ear canal normal.  Left Ear: Tympanic membrane, external ear and ear canal normal.  Nose: Nose normal.  Mouth/Throat: Uvula is midline, oropharynx is clear and moist and mucous membranes are normal. No tonsillar exudate.  Eyes: Pupils are equal, round, and reactive to light. Conjunctivae and EOM are normal.    Small stye noted to left lower lid with mild lid swelling noted; without active drainage; left eye conjunctiva and pupil WNL  Cardiovascular: Normal rate, regular rhythm and normal heart sounds.  Pulmonary/Chest: Effort normal and breath sounds normal.  Neurological: She is alert and oriented to person, place, and time.  Skin: Skin is warm and dry.     UC Treatments / Results  Labs (all labs ordered are listed, but only abnormal results are displayed) Labs Reviewed - No data to display  EKG None  Radiology No results found.  Procedures Procedures  (including critical care time)  Medications Ordered in UC Medications - No data to display  Initial Impression / Assessment and Plan / UC Course  I have reviewed the triage vital signs and the nursing notes.  Pertinent labs & imaging results that were available during my care of the patient were reviewed by me and considered in my medical decision making (see chart for details).     Lid washing as well as compresses, use of erythromycin ointment. Return precautions provided. Zyrtec daily provided. Patient verbalized understanding and agreeable to plan.     Final Clinical Impressions(s) / UC Diagnoses   Final diagnoses:  Blepharitis of left lower eyelid, unspecified type     Discharge Instructions     Wash your eyelid and apply warm compress approximately twice a day.  Use of eye ointment as prescribed. If no improvement or if worsening of symptoms, eye ball pain, vision change or otherwise worsening please follow up with eye doctor.    ED Prescriptions    Medication Sig Dispense Auth. Provider   cetirizine (ZYRTEC) 10 MG tablet Take 1 tablet (10 mg total) by mouth daily. 30 tablet Augusto Gamble B, NP   erythromycin ophthalmic ointment Place 1 application into the left eye 4 (four) times daily for 7 days. 28 g Augusto Gamble B, NP     Controlled Substance Prescriptions [place] Controlled Substance Registry consulted? Not Applicable   Zigmund Gottron, NP 06/27/17 1052

## 2017-06-27 NOTE — Discharge Instructions (Signed)
Wash your eyelid and apply warm compress approximately twice a day.  Use of eye ointment as prescribed. If no improvement or if worsening of symptoms, eye ball pain, vision change or otherwise worsening please follow up with eye doctor.

## 2017-06-27 NOTE — ED Triage Notes (Signed)
Pt here for left eye pain and swelling. sts that she had a stye this past week and that resolved. She woke up this way.

## 2017-09-13 ENCOUNTER — Ambulatory Visit (INDEPENDENT_AMBULATORY_CARE_PROVIDER_SITE_OTHER): Payer: Self-pay | Admitting: Physician Assistant

## 2017-09-14 ENCOUNTER — Encounter (INDEPENDENT_AMBULATORY_CARE_PROVIDER_SITE_OTHER): Payer: Self-pay | Admitting: Physician Assistant

## 2017-09-14 ENCOUNTER — Ambulatory Visit (INDEPENDENT_AMBULATORY_CARE_PROVIDER_SITE_OTHER): Payer: Self-pay | Admitting: Physician Assistant

## 2017-09-14 ENCOUNTER — Other Ambulatory Visit: Payer: Self-pay

## 2017-09-14 VITALS — BP 147/83 | HR 70 | Temp 98.2°F | Ht 64.0 in | Wt 221.6 lb

## 2017-09-14 DIAGNOSIS — J029 Acute pharyngitis, unspecified: Secondary | ICD-10-CM

## 2017-09-14 DIAGNOSIS — J3089 Other allergic rhinitis: Secondary | ICD-10-CM

## 2017-09-14 DIAGNOSIS — R059 Cough, unspecified: Secondary | ICD-10-CM

## 2017-09-14 DIAGNOSIS — E119 Type 2 diabetes mellitus without complications: Secondary | ICD-10-CM

## 2017-09-14 DIAGNOSIS — L989 Disorder of the skin and subcutaneous tissue, unspecified: Secondary | ICD-10-CM

## 2017-09-14 DIAGNOSIS — R05 Cough: Secondary | ICD-10-CM

## 2017-09-14 LAB — POCT GLYCOSYLATED HEMOGLOBIN (HGB A1C): HEMOGLOBIN A1C: 6 % — AB (ref 4.0–5.6)

## 2017-09-14 MED ORDER — BENZONATATE 200 MG PO CAPS: 200.0000 mg | ORAL_CAPSULE | Freq: Two times a day (BID) | ORAL | 0 refills | Status: DC | PRN

## 2017-09-14 MED ORDER — DM-APAP-CPM 15-500-2 MG PO TABS: 2.0000 | ORAL_TABLET | Freq: Four times a day (QID) | ORAL | 0 refills | Status: AC

## 2017-09-14 MED ORDER — FLUTICASONE PROPIONATE 50 MCG/ACT NA SUSP: 2.0000 | Freq: Every day | NASAL | 2 refills | Status: DC

## 2017-09-14 MED FILL — BENZONATATE 100 MG CAP: 100 | 10 days supply | Qty: 40 | Fill #0

## 2017-09-14 MED FILL — FLUTICASONE PROP 50 MCG SPR: 50 | 30 days supply | Qty: 16 | Fill #0

## 2017-09-14 NOTE — Patient Instructions (Signed)
Prediabetes Prediabetes is the condition of having a blood sugar (blood glucose) level that is higher than it should be, but not high enough for you to be diagnosed with type 2 diabetes. Having prediabetes puts you at risk for developing type 2 diabetes (type 2 diabetes mellitus). Prediabetes may be called impaired glucose tolerance or impaired fasting glucose. Prediabetes usually does not cause symptoms. Your health care provider can diagnose this condition with blood tests. You may be tested for prediabetes if you are overweight and if you have at least one other risk factor for prediabetes. Risk factors for prediabetes include:  Having a family member with type 2 diabetes.  Being overweight or obese.  Being older than age 57.  Being of American-Indian, African-American, Hispanic/Latino, or Asian/Pacific Islander descent.  Having an inactive (sedentary) lifestyle.  Having a history of gestational diabetes or polycystic ovarian syndrome (PCOS).  Having low levels of good cholesterol (HDL-C) or high levels of blood fats (triglycerides).  Having high blood pressure.  What is blood glucose and how is blood glucose measured?  Blood glucose refers to the amount of glucose in your bloodstream. Glucose comes from eating foods that contain sugars and starches (carbohydrates) that the body breaks down into glucose. Your blood glucose level may be measured in mg/dL (milligrams per deciliter) or mmol/L (millimoles per liter).Your blood glucose may be checked with one or more of the following blood tests:  A fasting blood glucose (FBG) test. You will not be allowed to eat (you will fast) for at least 8 hours before a blood sample is taken. ? A normal range for FBG is 70-100 mg/dl (3.9-5.6 mmol/L).  An A1c (hemoglobin A1c) blood test. This test provides information about blood glucose control over the previous 2?68month.  An oral glucose tolerance test (OGTT). This test measures your blood  glucose twice: ? After fasting. This is your baseline level. ? Two hours after you drink a beverage that contains glucose.  You may be diagnosed with prediabetes:  If your FBG is 100?125 mg/dL (5.6-6.9 mmol/L).  If your A1c level is 5.7?6.4%.  If your OGGT result is 140?199 mg/dL (7.8-11 mmol/L).  These blood tests may be repeated to confirm your diagnosis. What happens if blood glucose is too high? The pancreas produces a hormone (insulin) that helps move glucose from the bloodstream into cells. When cells in the body do not respond properly to insulin that the body makes (insulin resistance), excess glucose builds up in the blood instead of going into cells. As a result, high blood glucose (hyperglycemia) can develop, which can cause many complications. This is a symptom of prediabetes. What can happen if blood glucose stays higher than normal for a long time? Having high blood glucose for a long time is dangerous. Too much glucose in your blood can damage your nerves and blood vessels. Long-term damage can lead to complications from diabetes, which may include:  Heart disease.  Stroke.  Blindness.  Kidney disease.  Depression.  Poor circulation in the feet and legs, which could lead to surgical removal (amputation) in severe cases.  How can prediabetes be prevented from turning into type 2 diabetes?  To help prevent type 2 diabetes, take the following actions:  Be physically active. ? Do moderate-intensity physical activity for at least 30 minutes on at least 5 days of the week, or as much as told by your health care provider. This could be brisk walking, biking, or water aerobics. ? Ask your health care provider what  activities are safe for you. A mix of physical activities may be best, such as walking, swimming, cycling, and strength training.  Lose weight as told by your health care provider. ? Losing 5-7% of your body weight can reverse insulin resistance. ? Your health  care provider can determine how much weight loss is best for you and can help you lose weight safely.  Follow a healthy meal plan. This includes eating lean proteins, complex carbohydrates, fresh fruits and vegetables, low-fat dairy products, and healthy fats. ? Follow instructions from your health care provider about eating or drinking restrictions. ? Make an appointment to see a diet and nutrition specialist (registered dietitian) to help you create a healthy eating plan that is right for you.  Do not smoke or use any tobacco products, such as cigarettes, chewing tobacco, and e-cigarettes. If you need help quitting, ask your health care provider.  Take over-the-counter and prescription medicines as told by your health care provider. You may be prescribed medicines that help lower the risk of type 2 diabetes.  This information is not intended to replace advice given to you by your health care provider. Make sure you discuss any questions you have with your health care provider. Document Released: 05/26/2015 Document Revised: 07/10/2015 Document Reviewed: 03/25/2015 Elsevier Interactive Patient Education  2018 [employer].   Allergic Rhinitis, Adult Allergic rhinitis is an allergic reaction that affects the mucous membrane inside the nose. It causes sneezing, a runny or stuffy nose, and the feeling of mucus going down the back of the throat (postnasal drip). Allergic rhinitis can be mild to severe. There are two types of allergic rhinitis:  Seasonal. This type is also called hay fever. It happens only during certain seasons.  Perennial. This type can happen at any time of the year.  What are the causes? This condition happens when the body's defense system (immune system) responds to certain harmless substances called allergens as though they were germs.  Seasonal allergic rhinitis is triggered by pollen, which can come from grasses, trees, and weeds. Perennial allergic rhinitis may be  caused by:  House dust mites.  Pet dander.  Mold spores.  What are the signs or symptoms? Symptoms of this condition include:  Sneezing.  Runny or stuffy nose (nasal congestion).  Postnasal drip.  Itchy nose.  Tearing of the eyes.  Trouble sleeping.  Daytime sleepiness.  How is this diagnosed? This condition may be diagnosed based on:  Your medical history.  A physical exam.  Tests to check for related conditions, such as: ? Asthma. ? Pink eye. ? Ear infection. ? Upper respiratory infection.  Tests to find out which allergens trigger your symptoms. These may include skin or blood tests.  How is this treated? There is no cure for this condition, but treatment can help control symptoms. Treatment may include:  Taking medicines that block allergy symptoms, such as antihistamines. Medicine may be given as a shot, nasal spray, or pill.  Avoiding the allergen.  Desensitization. This treatment involves getting ongoing shots until your body becomes less sensitive to the allergen. This treatment may be done if other treatments do not help.  If taking medicine and avoiding the allergen does not work, new, stronger medicines may be prescribed.  Follow these instructions at home:  Find out what you are allergic to. Common allergens include smoke, dust, and pollen.  Avoid the things you are allergic to. These are some things you can do to help avoid allergens: ? Replace carpet  with wood, tile, or vinyl flooring. Carpet can trap dander and dust. ? Do not smoke. Do not allow smoking in your home. ? Change your heating and air conditioning filter at least once a month. ? During allergy season:  Keep windows closed as much as possible.  Plan outdoor activities when pollen counts are lowest. This is usually during the evening hours.  When coming indoors, change clothing and shower before sitting on furniture or bedding.  Take over-the-counter and prescription  medicines only as told by your health care provider.  Keep all follow-up visits as told by your health care provider. This is important. Contact a health care provider if:  You have a fever.  You develop a persistent cough.  You make whistling sounds when you breathe (you wheeze).  Your symptoms interfere with your normal daily activities. Get help right away if:  You have shortness of breath. Summary  This condition can be managed by taking medicines as directed and avoiding allergens.  Contact your health care provider if you develop a persistent cough or fever.  During allergy season, keep windows closed as much as possible. This information is not intended to replace advice given to you by your health care provider. Make sure you discuss any questions you have with your health care provider. Document Released: 10/27/2000 Document Revised: 03/11/2016 Document Reviewed: 03/11/2016 Elsevier Interactive Patient Education  [employer].

## 2017-09-14 NOTE — Progress Notes (Signed)
Subjective:  Patient ID: Brandy Stewart, female    DOB: Dec 26, 1950  Age: 67 y.o. MRN: [id number]  CC: cough  HPI Brandy Stewart a 67 y.o.femalewithamedical history of discoid lupus, prediabetes,and seasonal allergies presents with complaint of cough and nasal congestion x1 week. Cough worse at night when laying in bed. No close contacts with the same. Has not taken anything for relief. Also complains of two hyperpigmented skin lesions on LLE since approximately 2-3 weeks ago. No pruritus, bleeding, or suppuration. Another roughly textured non-pruritic lesion in proximity to the two hyperpigmented lesions since approximately 2-3 weeks ago. Does not endorse any other symptoms or complaints.       Outpatient Medications Prior to Visit  Medication Sig Dispense Refill  . clobetasol (TEMOVATE) 0.05 % GEL Apply 1 application topically 2 (two) times daily. (Patient not taking: Reported on 09/14/2017) 1 each 3  . famotidine (PEPCID) 40 MG tablet Take 1 tablet (40 mg total) by mouth daily. 30 tablet 0  . fluticasone (FLONASE) 50 MCG/ACT nasal spray Place 2 sprays into both nostrils daily. (Patient not taking: Reported on 09/14/2017) 16 g 6  . cetirizine (ZYRTEC) 10 MG tablet Take 1 tablet (10 mg total) by mouth daily. 30 tablet 0  . ranitidine (ZANTAC) 150 MG tablet Take 1 tablet (150 mg total) by mouth 2 (two) times daily. 30 tablet 0  . traMADol (ULTRAM) 50 MG tablet Take 1 tablet (50 mg total) by mouth every 6 (six) hours as needed. 15 tablet 0   No facility-administered medications prior to visit.      ROS Review of Systems  Constitutional: Negative for chills, fever and malaise/fatigue.  HENT: Positive for congestion.   Eyes: Negative for blurred vision.  Respiratory: Positive for cough. Negative for shortness of breath.   Cardiovascular: Negative for chest pain and palpitations.  Gastrointestinal: Negative for abdominal pain and nausea.  Genitourinary: Negative for dysuria and  hematuria.  Musculoskeletal: Negative for joint pain and myalgias.  Skin: Negative for rash.  Neurological: Negative for tingling and headaches.  Psychiatric/Behavioral: Negative for depression. The patient is not nervous/anxious.     Objective:  BP (!) 147/83 (BP Location: Left Arm, Patient Position: Sitting, Cuff Size: Large)   Pulse 70   Temp 98.2 F (36.8 C) (Oral)   Ht 5\' 4"  (1.626 m)   Wt 221 lb 9.6 oz (100.5 kg)   SpO2 96%   BMI 38.04 kg/m   BP/Weight 09/14/2017 06/27/2017 2/42/3536  Systolic BP 144 315 400  Diastolic BP 83 72 86  Wt. (Lbs) 221.6 - -  BMI 38.04 - -      Physical Exam  Constitutional: She is oriented to person, place, and time.  Well developed, well nourished, NAD, polite  HENT:  Head: Normocephalic and atraumatic.  Mouth/Throat: No oropharyngeal exudate.  Turbinates hypertrophic and pale bilaterally with clear mucus  Eyes: Right eye exhibits no discharge. Left eye exhibits no discharge. No scleral icterus.  Neck: Normal range of motion. Neck supple. No thyromegaly present.  Cardiovascular: Normal rate, regular rhythm and normal heart sounds.  Pulmonary/Chest: Effort normal and breath sounds normal. No stridor. No respiratory distress. She has no wheezes. She has no rales.  Abdominal: Soft. Bowel sounds are normal. There is no tenderness.  Musculoskeletal: She exhibits no edema.  Neurological: She is alert and oriented to person, place, and time.  Skin: Skin is warm and dry. Rash noted. No erythema. No pallor.  Psychiatric: She has a normal mood and affect. Her behavior  is normal. Thought content normal.  Vitals reviewed.    Assessment & Plan:    1. Non-seasonal allergic rhinitis, unspecified trigger - Begin DM-APAP-CPM (CORICIDIN HBP FLU) 15-500-2 MG TABS; Take 2 tablets by mouth every 6 (six) hours for 5 days.  Dispense: 1 each; Refill: 0 - Begin fluticasone (FLONASE) 50 MCG/ACT nasal spray; Place 2 sprays into both nostrils daily.  Dispense:  16 g; Refill: 2  2. Cough - Begin benzonatate (TESSALON) 200 MG capsule; Take 1 capsule (200 mg total) by mouth 2 (two) times daily as needed for cough.  Dispense: 20 capsule; Refill: 0  3. Sore throat -  Begin DM-APAP-CPM (CORICIDIN HBP FLU) 15-500-2 MG TABS; Take 2 tablets by mouth every 6 (six) hours for 5 days.  Dispense: 1 each; Refill: 0  4. Skin lesion - Ambulatory referral to Dermatology - Suspected lesion attributed to discoid lupus  5. Type 2 diabetes mellitus without complication, without long-term current use of insulin (HCC) - HgB A1c 6.0%   Meds ordered this encounter  Medications  . benzonatate (TESSALON) 200 MG capsule    Sig: Take 1 capsule (200 mg total) by mouth 2 (two) times daily as needed for cough.    Dispense:  20 capsule    Refill:  0    Order Specific Question:   Supervising Provider    Answer:   Charlott Rakes [[id number]]  . DM-APAP-CPM (CORICIDIN HBP FLU) 15-500-2 MG TABS    Sig: Take 2 tablets by mouth every 6 (six) hours for 5 days.    Dispense:  1 each    Refill:  0    Order Specific Question:   Supervising Provider    Answer:   Charlott Rakes [[id number]]  . fluticasone (FLONASE) 50 MCG/ACT nasal spray    Sig: Place 2 sprays into both nostrils daily.    Dispense:  16 g    Refill:  2    Order Specific Question:   Supervising Provider    Answer:   Charlott Rakes [[id number]]    Follow-up: 4 weeks HTN   Clent Demark PA

## 2017-09-14 NOTE — Progress Notes (Signed)
Pt complains that throat is sore she has been coughing and her nose running  Complains of spot on left leg that got really dry and turned black

## 2017-10-14 ENCOUNTER — Other Ambulatory Visit: Payer: Self-pay

## 2017-10-14 ENCOUNTER — Encounter (INDEPENDENT_AMBULATORY_CARE_PROVIDER_SITE_OTHER): Payer: Self-pay | Admitting: Physician Assistant

## 2017-10-14 ENCOUNTER — Telehealth (INDEPENDENT_AMBULATORY_CARE_PROVIDER_SITE_OTHER): Payer: Self-pay | Admitting: Physician Assistant

## 2017-10-14 ENCOUNTER — Ambulatory Visit (INDEPENDENT_AMBULATORY_CARE_PROVIDER_SITE_OTHER): Payer: Self-pay | Admitting: Physician Assistant

## 2017-10-14 VITALS — BP 119/79 | HR 71 | Temp 98.1°F | Ht 64.0 in | Wt 220.0 lb

## 2017-10-14 DIAGNOSIS — J3089 Other allergic rhinitis: Secondary | ICD-10-CM

## 2017-10-14 MED ORDER — HYDROXYZINE HCL 25 MG PO TABS: 25.0000 mg | ORAL_TABLET | Freq: Every evening | ORAL | 2 refills | Status: DC | PRN

## 2017-10-14 MED ORDER — CETIRIZINE HCL 10 MG PO TABS: 10.0000 mg | ORAL_TABLET | Freq: Every day | ORAL | 11 refills | Status: DC

## 2017-10-14 MED FILL — ?CETIRIZINE HCL 10 MG TABLE: 10 | 30 days supply | Qty: 30 | Fill #0

## 2017-10-14 MED FILL — hydrOXYzine HCL 25 MG TABS: 25 | 30 days supply | Qty: 30 | Fill #0

## 2017-10-14 NOTE — Patient Instructions (Signed)

## 2017-10-14 NOTE — Progress Notes (Signed)
Pt still has cough also has runny nose. States her nose hurts

## 2017-10-14 NOTE — Progress Notes (Signed)
Subjective:  Patient ID: Brandy Stewart, female    DOB: [birth year]  Age: 67 y.o. MRN: [id number]  CC: f/u HTN  HPI Brandy Stewart a 67 y.o.femalewithamedical history of discoid lupus, prediabetes,and seasonal allergies presents for HTN f/u. Last BP 147/83 mmHg one month ago during a visit for sore throat and malaise. BP is 119/79 mmHg today without use of anti-hypertensives. Says she continues to have sore throat, nasal congestion, and cough. Took fluticasone nasal spray which reportedly made her worse. Does not currently take anti-histamines. Does not endorse malaise, f/c/n/v, rash, swelling, abdominal pain, CP, SOB, HA, or GI/GU sxs.     Has not done her mammogram yet because they did not have an opening the week she called. She has not called back since but says she will.       Outpatient Medications Prior to Visit  Medication Sig Dispense Refill  . benzonatate (TESSALON) 200 MG capsule Take 1 capsule (200 mg total) by mouth 2 (two) times daily as needed for cough. (Patient not taking: Reported on 10/14/2017) 20 capsule 0  . famotidine (PEPCID) 40 MG tablet Take 1 tablet (40 mg total) by mouth daily. 30 tablet 0  . fluticasone (FLONASE) 50 MCG/ACT nasal spray Place 2 sprays into both nostrils daily. (Patient not taking: Reported on 10/14/2017) 16 g 2  . clobetasol (TEMOVATE) 0.05 % GEL Apply 1 application topically 2 (two) times daily. 1 each 3   No facility-administered medications prior to visit.      ROS Review of Systems  Constitutional: Negative for chills, fever and malaise/fatigue.  HENT: Positive for congestion and sore throat. Negative for ear pain and sinus pain.   Eyes: Negative for blurred vision and pain.  Respiratory: Positive for cough. Negative for shortness of breath.   Cardiovascular: Negative for chest pain and palpitations.  Gastrointestinal: Negative for abdominal pain, nausea and vomiting.  Genitourinary: Negative for dysuria and hematuria.   Musculoskeletal: Positive for back pain. Negative for joint pain and myalgias.  Skin: Negative for rash.  Neurological: Negative for tingling and headaches.  Psychiatric/Behavioral: Negative for depression. The patient is not nervous/anxious.     Objective:  BP 119/79 (BP Location: Left Arm, Patient Position: Sitting, Cuff Size: Large)   Pulse 71   Temp 98.1 F (36.7 C) (Oral)   Ht 5\' 4"  (1.626 m)   Wt 220 lb (99.8 kg)   SpO2 96%   BMI 37.76 kg/m   BP/Weight 10/14/2017 09/14/2017 0/73/7106  Systolic BP 269 485 462  Diastolic BP 79 83 72  Wt. (Lbs) 220 221.6 -  BMI 37.76 38.04 -      Physical Exam  Constitutional: She is oriented to person, place, and time.  Well developed, well nourished, NAD, polite  HENT:  Head: Normocephalic and atraumatic.  Mouth/Throat: No oropharyngeal exudate.  Turbinates pale and hypertrophic bilaterally.  Eyes: No scleral icterus.  Neck: Normal range of motion. Neck supple. No thyromegaly present.  Cardiovascular: Normal rate, regular rhythm and normal heart sounds.  Pulmonary/Chest: Effort normal and breath sounds normal.  Musculoskeletal: She exhibits no edema.  Lymphadenopathy:    She has no cervical adenopathy.  Neurological: She is alert and oriented to person, place, and time.  Skin: Skin is warm and dry. No rash noted. No erythema. No pallor.  Psychiatric: She has a normal mood and affect. Her behavior is normal. Thought content normal.  Vitals reviewed.    Assessment & Plan:    1. Non-seasonal allergic rhinitis, unspecified trigger - Allergens,  Zone 2 - Begin cetirizine (ZYRTEC) 10 MG tablet; Take 1 tablet (10 mg total) by mouth daily.  Dispense: 30 tablet; Refill: 11 - Begin hydrOXYzine (ATARAX/VISTARIL) 25 MG tablet; Take 1 tablet (25 mg total) by mouth at bedtime as needed.  Dispense: 30 tablet; Refill: 2 - Ambulatory referral to Allergy Clinic  Meds ordered this encounter  Medications  . cetirizine (ZYRTEC) 10 MG tablet     Sig: Take 1 tablet (10 mg total) by mouth daily.    Dispense:  30 tablet    Refill:  11    Order Specific Question:   Supervising Provider    Answer:   Charlott Rakes [[id number]]  . hydrOXYzine (ATARAX/VISTARIL) 25 MG tablet    Sig: Take 1 tablet (25 mg total) by mouth at bedtime as needed.    Dispense:  30 tablet    Refill:  2    Order Specific Question:   Supervising Provider    Answer:   Charlott Rakes [[id number]]    Follow-up: 6 weeks for allergic rhinitis  Clent Demark PA

## 2017-10-14 NOTE — Telephone Encounter (Signed)
Pt came in to her appointment and needs an update on her Dermatology referral. Please follow up as soon as possible her OC is effective from  04/14/2017-01/28/2018

## 2017-10-18 LAB — ALLERGENS, ZONE 2
Alternaria Alternata IgE: <0.1 kU/L
Amer Sycamore IgE Qn: <0.1 kU/L
Aspergillus Fumigatus IgE: <0.1 kU/L
Cat Dander IgE: <0.1 kU/L
Cedar, Mountain IgE: <0.1 kU/L
Cladosporium Herbarum IgE: <0.1 kU/L
D Farinae IgE: <0.1 kU/L
D Pteronyssinus IgE: <0.1 kU/L
Dog Dander IgE: <0.1 kU/L
Elm, American IgE: <0.1 kU/L
Hickory, White IgE: <0.1 kU/L
Mugwort IgE Qn: <0.1 kU/L
Nettle IgE: <0.1 kU/L
Oak, White IgE: <0.1 kU/L
Penicillium Chrysogen IgE: <0.1 kU/L
Pigweed, Rough IgE: <0.1 kU/L
Stemphylium Herbarum IgE: <0.1 kU/L
White Mulberry IgE: <0.1 kU/L

## 2017-10-18 NOTE — Telephone Encounter (Signed)
Brandy Stewart is going to send the patient to  [facility]  (orange card so they can refer her to the specialist .

## 2017-10-19 ENCOUNTER — Telehealth (INDEPENDENT_AMBULATORY_CARE_PROVIDER_SITE_OTHER): Payer: Self-pay

## 2017-10-19 NOTE — Telephone Encounter (Signed)
-----   Message from Clent Demark, PA-C sent at 10/18/2017 12:37 PM EDT ----- Allergy testing for zone 2 is negative. I have already referred to allergist.

## 2017-10-19 NOTE — Telephone Encounter (Signed)
I sent the referral yesterday to [facility]. They should be contacting her within the next two weeks.  Brandy Stewart

## 2017-10-19 NOTE — Telephone Encounter (Signed)
Patient aware that allergy zone 2 testing is negative. Already referred to allergist, someone from their office will call and schedule appointment. Nat Christen, CMA

## 2017-11-14 ENCOUNTER — Ambulatory Visit (INDEPENDENT_AMBULATORY_CARE_PROVIDER_SITE_OTHER): Payer: Self-pay | Admitting: Physician Assistant

## 2017-11-18 ENCOUNTER — Ambulatory Visit: Payer: Self-pay | Admitting: Allergy & Immunology

## 2017-11-25 ENCOUNTER — Encounter (INDEPENDENT_AMBULATORY_CARE_PROVIDER_SITE_OTHER): Payer: Self-pay | Admitting: Physician Assistant

## 2017-11-25 ENCOUNTER — Ambulatory Visit: Payer: Self-pay | Attending: Physician Assistant

## 2017-11-25 ENCOUNTER — Ambulatory Visit (INDEPENDENT_AMBULATORY_CARE_PROVIDER_SITE_OTHER): Payer: Self-pay | Admitting: Physician Assistant

## 2017-11-25 VITALS — BP 147/74 | HR 67 | Temp 98.2°F | Ht 64.0 in | Wt 232.0 lb

## 2017-11-25 DIAGNOSIS — R109 Unspecified abdominal pain: Secondary | ICD-10-CM

## 2017-11-25 DIAGNOSIS — R350 Frequency of micturition: Secondary | ICD-10-CM

## 2017-11-25 DIAGNOSIS — Z1211 Encounter for screening for malignant neoplasm of colon: Secondary | ICD-10-CM

## 2017-11-25 DIAGNOSIS — R8281 Pyuria: Secondary | ICD-10-CM

## 2017-11-25 DIAGNOSIS — R300 Dysuria: Secondary | ICD-10-CM

## 2017-11-25 DIAGNOSIS — J069 Acute upper respiratory infection, unspecified: Secondary | ICD-10-CM

## 2017-11-25 DIAGNOSIS — I10 Essential (primary) hypertension: Secondary | ICD-10-CM

## 2017-11-25 LAB — POCT URINALYSIS DIPSTICK
BILIRUBIN UA: NEGATIVE
GLUCOSE UA: NEGATIVE
Ketones, UA: NEGATIVE
Nitrite, UA: NEGATIVE
Protein, UA: NEGATIVE
RBC UA: NEGATIVE
Spec Grav, UA: 1.02 (ref 1.010–1.025)
Urobilinogen, UA: 0.2 E.U./dL
pH, UA: 6 (ref 5.0–8.0)

## 2017-11-25 MED ORDER — DM-APAP-CPM 15-500-2 MG PO TABS: 2.0000 | ORAL_TABLET | Freq: Four times a day (QID) | ORAL | 0 refills | Status: AC

## 2017-11-25 MED ORDER — NAPROXEN 500 MG PO TABS: 500.0000 mg | ORAL_TABLET | Freq: Two times a day (BID) | ORAL | 0 refills | Status: DC

## 2017-11-25 MED FILL — NAPROXEN 500 MG TABLET: 500 | 15 days supply | Qty: 30 | Fill #0

## 2017-11-25 NOTE — Patient Instructions (Addendum)
Community Resources  Advocacy/Legal Legal Aid [place]:  1-866-219-5262  /  336-272-0148  Family Justice Center:  336-641-7233  [facility] [place] 24-hr Crisis line:  336-273-7273  Women's Resource Center, [place]:  336-275-6090  Court Watch (custody):  336-275-2346  [place] [facility]:   336-279-9299    Baby & Breastfeeding Car Seat Inspection @ Various [place] Fire Depts.- call 336-373-2177  [place] [facility]actation  336-832-6860  [facility] 336-878-6712  WIC: 336-641-3663 ([place]);  336-641-7571 ([place])  La Leche League:  1-877-452-5321   Childcare [place] [facility] Development: 336-369-5097 ([place]) / 336-887-8224 ([place])  - Child Care Resources/ Referrals/ Scholarships  - Head Start/ Early Head Start (call or apply online)  [place] DHHS: [place] Pre-K :  1-800-859-0829 / 336-274-5437   Employment / Job Search [facility] [place]: 336-275-6090 / [address]  [place] [employer] [facility] (JobLink): 336-373-5922 ([place]) / 336-882-4141 ([place])  [facility] Community Resource/ Career Center: 336-275-9801 / 336-282-7307  [facility] Job & Career Center: 336-373-3764  DHHS Work First: 336-641-3447 ([place]) / 336-641-3447 ([place])  [facility] [place]:  336-676-5871   Financial Assistance [place] [facility]:  336-553-2657  [employer]: 336-235-0368  [place] (furniture):  336-370-4002  [place] [facility]: 336-373-4264  Low Income Energy Assistance  336-641-3000   Food Assistance DHHS- SNAP/ Food Stamps: 336-641-4588  WIC: [place]- 336-641-3663 ;  [place] 336-641-7571  Little Green Book- Free Meals  Little Blue Book- Free Food Pantries  During the summer, text "FOOD" to 877877   General Health / Clinics (Adults) Orange Card (for Adults) through [place] [facility]: (336) 895-4900  [facility]:   336-832-8035  [place] [facility]:   336-832-4444  Health Department:  336-641-3245  [facility]:  336-415-3877 / 336-641-2100  Planned Parenthood of [place]:   336-373-0678  [facility]:   336-334-4822 x 50251   Housing [place] Housing Coalition:   336-691-9521  [place] Housing Authority:  336-275-8501  Affordable Housing Managemnt:  336-273-0568   Immigrant/ Refugee Center for [place]ians (UNCG):  336-256-1065  Faith [facility]:  336-379-0037  [facility] [facility]:  336-937-4701  Church World Services:  336-617-0381  African Services Coalition:  336-574-2677   LGBTQ YouthSAFE  www.youthsafe[place].org  PFLAG  336-541-6754 / info@[place].org  The Trevor Project:  1-866-488-7386   Mental Health/ Substance Use [facility] [place]  336-387-6161  [place] [facility]:  336-832-9700 or 1-800-711-2635  [facility] Circle of Care:  336-271-5888  [facility]:  336-294-1349  [place]:  336-542-2884  [facility] (walk-ins)  336-676-6840 / [address]  [place]:  800-449-1287  Alcoholics Anonymous:  336-854-4278  Narcotics Anonymous:  800-365-1036  Quit Smoking Hotline:  800-QUIT-NOW (800-784-8669)   Parenting Children's Home Society:  800-632-1400  [place]: Education Center & Support Groups:  336-832-6682  YWCA: 336-273-3461  [employer]CG: Bringing Out the Best:  336-334-3120               Thriving at Three (Hispanic families): 336-256-1066  Healthy Start ([facility] [place]):  336-387-6161 x2288  Parents as Teachers:  336-691-0024  [place] Child Development- Learning Together (Immigrants): 336-369-5001   Poison Control 800-222-1222  Sports & Recreation [employer] Open Doors Application: ymcanwnc.org/join/open-doors-financial-assistance/  City of [place] Recreation Centers: http://www.[place].gov/index.aspx?page=3615   Special Needs Family Support Network:  336-832-6507  Autism Society of :   336-333-0197 x1402 or x1412 /  800-785-1035  TEACCH [place]:  336-334-5773     ARC of [place]:  [place] (CDSA):  (239)858-5405  [facility] (Care Coordination for Children):  409-203-2284   Transportation Medicaid Transportation: 7694988594 to apply  [place]: (339)480-2988 (reduced-fare bus ID to [place])  SCAT Paratransit services: Eligible riders only, call 284-132-4401 for application   Tutoring/Mentoring [place]: [place]: 913-179-3512 Letta Kocher)  330-108-9568 ([place])  ACES through child's school: [place]: contact your local [place] [employer] Program: 563-010-1464    Upper Respiratory Infection, Adult Most upper respiratory infections (URIs) are caused by a virus. A URI affects the nose, throat, and upper air passages. The most common type of URI is often called "the common cold." Follow these instructions at home:  Take medicines only as told by your doctor.  Gargle warm saltwater or take cough drops to comfort your throat as told by your doctor.  Use a warm mist humidifier or inhale steam from a shower to increase air moisture. This may make it easier to breathe.  Drink enough fluid to keep your pee (urine) clear or pale yellow.  Eat soups and other clear broths.  Have a healthy diet.  Rest as needed.  Go back to work when your fever is gone or your doctor says it is okay. ? You may need to stay home longer to avoid giving your URI to others. ? You can also wear a face mask and wash your hands often to prevent spread of the virus.  Use your inhaler more if you have asthma.  Do not use any tobacco products, including cigarettes, chewing tobacco, or electronic cigarettes. If you need help quitting, ask your doctor. Contact a doctor if:  You are getting worse, not better.  Your symptoms are not helped by medicine.  You have chills.  You are getting more short of breath.  You have  brown or red mucus.  You have yellow or brown discharge from your nose.  You have pain in your face, especially when you bend forward.  You have a fever.  You have puffy (swollen) neck glands.  You have pain while swallowing.  You have white areas in the back of your throat. Get help right away if:  You have very bad or constant: ? Headache. ? Ear pain. ? Pain in your forehead, behind your eyes, and over your cheekbones (sinus pain). ? Chest pain.  You have long-lasting (chronic) lung disease and any of the following: ? Wheezing. ? Long-lasting cough. ? Coughing up blood. ? A change in your usual mucus.  You have a stiff neck.  You have changes in your: ? Vision. ? Hearing. ? Thinking. ? Mood. This information is not intended to replace advice given to you by your health care provider. Make sure you discuss any questions you have with your health care provider. Document Released: 07/21/2007 Document Revised: 10/05/2015 Document Reviewed: 05/09/2013 Elsevier Interactive Patient Education  2018 [employer].

## 2017-11-25 NOTE — Progress Notes (Signed)
Subjective:  Patient ID: Brandy Stewart, female    DOB: Feb 26, 1950  Age: 67 y.o. MRN: [id number]  CC: cold  HPI Brandy Stewart a 67 y.o.femalewithamedical history of discoid lupus, prediabetes,and seasonal allergiespresents with a "cold" since last week. Has a close contact with the same and his symptoms are resolving. Endorses fatigue, nasal congestion, mild dysuria, urinary frequency, and chronic LLQ/left flank pain. Has not taken anything for relief. Does not endorse CP, palpitations, SOB, cough, sneezing, fever, chills, nausea, vomiting, abdominal pain, swelling, or GI sxs.     Patient will be seeing dermatology later today the the chronic skin lesion on her LLE. Lesion is suspected of being a lupus lesion. Pt has not been able to see Rheumatologist as she has been denied. Has transportation issues and lack of funds which makes it difficult to see a rheumatologist elsewhere.    Outpatient Medications Prior to Visit  Medication Sig Dispense Refill  . cetirizine (ZYRTEC) 10 MG tablet Take 1 tablet (10 mg total) by mouth daily. 30 tablet 11  . famotidine (PEPCID) 40 MG tablet Take 1 tablet (40 mg total) by mouth daily. 30 tablet 0  . hydrOXYzine (ATARAX/VISTARIL) 25 MG tablet Take 1 tablet (25 mg total) by mouth at bedtime as needed. 30 tablet 2   No facility-administered medications prior to visit.      ROS Review of Systems  Constitutional: Positive for malaise/fatigue. Negative for chills and fever.  HENT: Positive for congestion. Negative for sore throat.   Eyes: Negative for blurred vision.  Respiratory: Negative for cough, shortness of breath and wheezing.   Cardiovascular: Negative for chest pain, palpitations and leg swelling.  Gastrointestinal: Positive for abdominal pain (LLQ). Negative for blood in stool, constipation, diarrhea, melena and nausea.  Genitourinary: Positive for dysuria, flank pain and frequency. Negative for hematuria and urgency.  Musculoskeletal:  Positive for back pain. Negative for joint pain and myalgias.  Skin: Positive for rash (LLE).  Neurological: Negative for tingling and headaches.  Psychiatric/Behavioral: Negative for depression. The patient is not nervous/anxious.     Objective:  There were no vitals taken for this visit.  BP/Weight 10/14/2017 09/14/2017 9/32/3557  Systolic BP 322 025 427  Diastolic BP 79 83 72  Wt. (Lbs) 220 221.6 -  BMI 37.76 38.04 -      Physical Exam  Constitutional: She is oriented to person, place, and time.  Well developed, well nourished, NAD, polite  HENT:  Head: Normocephalic and atraumatic.  Mouth/Throat: No oropharyngeal exudate.  TM normal bilaterally. Turbinates moderately hypertrophic and pale, mild post nasal drip  Eyes: No scleral icterus.  Neck: Normal range of motion. Neck supple. No thyromegaly present.  Cardiovascular: Normal rate, regular rhythm and normal heart sounds.  Pulmonary/Chest: Effort normal and breath sounds normal. No stridor. No respiratory distress. She has no wheezes. She has no rales.  Abdominal: Soft. There is tenderness (mild LLQ pain and left flank pain).  Musculoskeletal: She exhibits no edema.  Lymphadenopathy:    She has no cervical adenopathy.  Neurological: She is alert and oriented to person, place, and time.  Skin: Skin is warm and dry. No rash noted. No erythema. No pallor.  Irregularly shaped, hyperpigmented, and scaly patch on LLE in the pretibial area  Psychiatric: She has a normal mood and affect. Her behavior is normal. Thought content normal.  Vitals reviewed.    Assessment & Plan:   1. Acute upper respiratory infection - DM-APAP-CPM (CORICIDIN HBP FLU) 15-500-2 MG TABS; Take 2 tablets  by mouth every 6 (six) hours for 5 days.  Dispense: 1 each; Refill: 0 - naproxen (NAPROSYN) 500 MG tablet; Take 1 tablet (500 mg total) by mouth 2 (two) times daily with a meal.  Dispense: 30 tablet; Refill: 0  2. Urinary frequency - Urinalysis  Dipstick with trace leukocytes - Urine Culture  3. Dysuria - Urinalysis Dipstick with trace leukocytes - Urine Culture  4. Left flank pain - Begin naproxen (NAPROSYN) 500 MG tablet; Take 1 tablet (500 mg total) by mouth 2 (two) times daily with a meal.  Dispense: 30 tablet; Refill: 0 - Urinalysis Dipstick - Urine Culture - Fecal occult blood, immunochemical   5. Pyuria - Urine Culture  6. Screening for colon cancer - Fecal occult blood, immunochemical  7. Hypertension, unspecified type - Microalbumin / creatinine urine ratio   Meds ordered this encounter  Medications  . DM-APAP-CPM (CORICIDIN HBP FLU) 15-500-2 MG TABS    Sig: Take 2 tablets by mouth every 6 (six) hours for 5 days.    Dispense:  1 each    Refill:  0    Order Specific Question:   Supervising Provider    Answer:   Charlott Rakes [[id number]]  . naproxen (NAPROSYN) 500 MG tablet    Sig: Take 1 tablet (500 mg total) by mouth 2 (two) times daily with a meal.    Dispense:  30 tablet    Refill:  0    Order Specific Question:   Supervising Provider    Answer:   Charlott Rakes [[id number]]    Follow-up: Return in about 4 weeks (around 12/23/2017).   Clent Demark PA

## 2017-11-26 LAB — MICROALBUMIN / CREATININE URINE RATIO
Creatinine, Urine: 139.4 mg/dL
MICROALB/CREAT RATIO: 7.6 mg/g{creat} (ref 0.0–30.0)
MICROALBUM., U, RANDOM: 10.6 ug/mL

## 2017-11-27 LAB — URINE CULTURE

## 2017-11-28 ENCOUNTER — Telehealth (INDEPENDENT_AMBULATORY_CARE_PROVIDER_SITE_OTHER): Payer: Self-pay

## 2017-11-28 NOTE — Telephone Encounter (Signed)
Left message asking patient to return call to [facility]. Tempestt S Roberts, CMA  

## 2017-11-28 NOTE — Telephone Encounter (Signed)
-----   Message from Clent Demark, PA-C sent at 11/28/2017  5:11 PM EDT ----- Urinary proteins normal. Culture shows minimal growth. No antibiotic needed.

## 2017-11-29 ENCOUNTER — Telehealth (INDEPENDENT_AMBULATORY_CARE_PROVIDER_SITE_OTHER): Payer: Self-pay

## 2017-11-29 NOTE — Telephone Encounter (Signed)
-----   Message from Clent Demark, PA-C sent at 11/28/2017  5:11 PM EDT ----- Urinary proteins normal. Culture shows minimal growth. No antibiotic needed.

## 2017-11-29 NOTE — Telephone Encounter (Signed)
Patient is aware that urinary proteins are normal, minimal growth on culture. No antibiotic needed. Nat Christen, CMA

## 2017-12-15 ENCOUNTER — Ambulatory Visit: Payer: Self-pay | Admitting: Allergy

## 2017-12-23 ENCOUNTER — Ambulatory Visit: Payer: Self-pay

## 2017-12-26 ENCOUNTER — Ambulatory Visit (INDEPENDENT_AMBULATORY_CARE_PROVIDER_SITE_OTHER): Payer: Self-pay | Admitting: Physician Assistant

## 2018-01-18 ENCOUNTER — Ambulatory Visit (INDEPENDENT_AMBULATORY_CARE_PROVIDER_SITE_OTHER): Payer: Self-pay | Admitting: Physician Assistant

## 2018-01-18 ENCOUNTER — Other Ambulatory Visit (INDEPENDENT_AMBULATORY_CARE_PROVIDER_SITE_OTHER): Payer: Self-pay

## 2018-01-18 ENCOUNTER — Encounter (INDEPENDENT_AMBULATORY_CARE_PROVIDER_SITE_OTHER): Payer: Self-pay | Admitting: Physician Assistant

## 2018-01-18 VITALS — BP 131/77 | HR 79 | Temp 98.1°F | Resp 16 | Wt 233.6 lb

## 2018-01-18 DIAGNOSIS — J069 Acute upper respiratory infection, unspecified: Secondary | ICD-10-CM

## 2018-01-18 DIAGNOSIS — Z1211 Encounter for screening for malignant neoplasm of colon: Secondary | ICD-10-CM

## 2018-01-18 DIAGNOSIS — G8929 Other chronic pain: Secondary | ICD-10-CM

## 2018-01-18 DIAGNOSIS — M25562 Pain in left knee: Secondary | ICD-10-CM

## 2018-01-18 MED ORDER — PHENYLEPHRINE-DM-GG-APAP 5-10-200-325 MG/10ML PO LIQD: 20.0000 mL | ORAL | 0 refills | Status: AC

## 2018-01-18 MED ORDER — NAPROXEN 500 MG PO TABS: 500.0000 mg | ORAL_TABLET | Freq: Two times a day (BID) | ORAL | 0 refills | Status: DC

## 2018-01-18 MED FILL — NAPROXEN 500 MG TABLET: 500 | 15 days supply | Qty: 30 | Fill #0

## 2018-01-18 NOTE — Progress Notes (Signed)
Subjective:  Patient ID: Brandy Stewart, female    DOB: 11/20/1950  Age: 67 y.o. MRN: [id number]  CC: sore throat  HPI Jemmie Ledgerwood a 67 y.o.femalewithamedical history of discoid lupus, prediabetes,and seasonal allergiespresents with sore throat, nasal congestion, and cough since three days ago. Niece has a cold. Has not taken anything for relief.      Also has left knee pain since 6 - 7 years. Had left knee exploration and repair. Says she had the "cartilage cleaned up". Knee gave out yesterday while walking. Pain in felt anteriorly and rated 5/10. Has used NSAID with mild relief of pain. Plans to buy knee brace      Outpatient Medications Prior to Visit  Medication Sig Dispense Refill  . famotidine (PEPCID) 40 MG tablet Take 1 tablet (40 mg total) by mouth daily. 30 tablet 0  . naproxen (NAPROSYN) 500 MG tablet Take 1 tablet (500 mg total) by mouth 2 (two) times daily with a meal. 30 tablet 0   No facility-administered medications prior to visit.      ROS Review of Systems  Constitutional: Negative for chills, fever and malaise/fatigue.  HENT: Positive for congestion and sore throat.   Eyes: Negative for blurred vision.  Respiratory: Negative for shortness of breath.   Cardiovascular: Negative for chest pain and palpitations.  Gastrointestinal: Negative for abdominal pain and nausea.  Genitourinary: Negative for dysuria and hematuria.  Musculoskeletal: Positive for joint pain. Negative for myalgias.  Skin: Negative for rash.  Neurological: Negative for tingling and headaches.  Psychiatric/Behavioral: Negative for depression. The patient is not nervous/anxious.     Objective:     Physical Exam  Constitutional: She is oriented to person, place, and time.  Well developed, obese, NAD, polite  HENT:  Head: Normocephalic and atraumatic.  Mouth/Throat: No oropharyngeal exudate.  Turbinates mildly hypertrophic. TMs normal. No sinus tenderness.  Eyes: No scleral  icterus.  Neck: Normal range of motion. Neck supple. No thyromegaly present.  Cardiovascular: Normal rate, regular rhythm and normal heart sounds.  Pulmonary/Chest: Effort normal and breath sounds normal.  Musculoskeletal: She exhibits no edema.  Left knee with mild TTP at the medial aspect of midline of joint. Negative anterior/posterior drawer, varus/valgus stress testing negative, and negative patellar apprehension. Left knee without edema, erythema, or ecchymosis.  Lymphadenopathy:    She has cervical adenopathy.  Neurological: She is alert and oriented to person, place, and time.  Normal gait  Skin: Skin is warm and dry. No rash noted. No erythema. No pallor.  Psychiatric: She has a normal mood and affect. Her behavior is normal. Thought content normal.  Vitals reviewed.    Assessment & Plan:    1. Acute upper respiratory infection - Phenylephrine-DM-GG-APAP (MUCINEX FAST-MAX COLD FLU) 5-10-200-325 MG/10ML LIQD; Take 20 mLs by mouth every 4 (four) hours for 5 days.  Dispense: 1 Bottle; Refill: 0  2. Chronic pain of left knee - Knee brace - naproxen (NAPROSYN) 500 MG tablet; Take 1 tablet (500 mg total) by mouth 2 (two) times daily with a meal.  Dispense: 30 tablet; Refill: 0 - DG Knee Complete 4 Views Left; Future - AMB referral to orthopedics  3. Screening for colon cancer - Fecal occult blood, imunochemical   Meds ordered this encounter  Medications  . naproxen (NAPROSYN) 500 MG tablet    Sig: Take 1 tablet (500 mg total) by mouth 2 (two) times daily with a meal.    Dispense:  30 tablet    Refill:  0  Order Specific Question:   Supervising Provider    Answer:   Charlott Rakes [[id number]]  . Phenylephrine-DM-GG-APAP (MUCINEX FAST-MAX COLD FLU) 5-10-200-325 MG/10ML LIQD    Sig: Take 20 mLs by mouth every 4 (four) hours for 5 days.    Dispense:  1 Bottle    Refill:  0    Order Specific Question:   Supervising Provider    Answer:   Charlott Rakes [[id number]]    Follow-up:  Return in about 12 weeks (around 04/12/2018) for f/u knee pain.   Clent Demark PA

## 2018-01-18 NOTE — Patient Instructions (Signed)
Upper Respiratory Infection, Adult Most upper respiratory infections (URIs) are caused by a virus. A URI affects the nose, throat, and upper air passages. The most common type of URI is often called "the common cold." Follow these instructions at home:  Take medicines only as told by your doctor.  Gargle warm saltwater or take cough drops to comfort your throat as told by your doctor.  Use a warm mist humidifier or inhale steam from a shower to increase air moisture. This may make it easier to breathe.  Drink enough fluid to keep your pee (urine) clear or pale yellow.  Eat soups and other clear broths.  Have a healthy diet.  Rest as needed.  Go back to work when your fever is gone or your doctor says it is okay. ? You may need to stay home longer to avoid giving your URI to others. ? You can also wear a face mask and wash your hands often to prevent spread of the virus.  Use your inhaler more if you have asthma.  Do not use any tobacco products, including cigarettes, chewing tobacco, or electronic cigarettes. If you need help quitting, ask your doctor. Contact a doctor if:  You are getting worse, not better.  Your symptoms are not helped by medicine.  You have chills.  You are getting more short of breath.  You have brown or red mucus.  You have yellow or brown discharge from your nose.  You have pain in your face, especially when you bend forward.  You have a fever.  You have puffy (swollen) neck glands.  You have pain while swallowing.  You have white areas in the back of your throat. Get help right away if:  You have very bad or constant: ? Headache. ? Ear pain. ? Pain in your forehead, behind your eyes, and over your cheekbones (sinus pain). ? Chest pain.  You have long-lasting (chronic) lung disease and any of the following: ? Wheezing. ? Long-lasting cough. ? Coughing up blood. ? A change in your usual mucus.  You have a stiff neck.  You have  changes in your: ? Vision. ? Hearing. ? Thinking. ? Mood. This information is not intended to replace advice given to you by your health care provider. Make sure you discuss any questions you have with your health care provider. Document Released: 07/21/2007 Document Revised: 10/05/2015 Document Reviewed: 05/09/2013 Elsevier Interactive Patient Education  2018 Elsevier Inc.  

## 2018-01-20 ENCOUNTER — Telehealth (INDEPENDENT_AMBULATORY_CARE_PROVIDER_SITE_OTHER): Payer: Self-pay

## 2018-01-20 LAB — FECAL OCCULT BLOOD, IMMUNOCHEMICAL: FECAL OCCULT BLD: NEGATIVE

## 2018-01-20 NOTE — Telephone Encounter (Signed)
Patient called stating that when she takes 2 naproxen she feels sick. She wants to know if she would be ok to take just one. Nat Christen, CMA

## 2018-01-20 NOTE — Telephone Encounter (Signed)
Patient is aware that FIT is negative. Nat Christen, CMA

## 2018-01-20 NOTE — Telephone Encounter (Signed)
-----   Message from Clent Demark, PA-C sent at 01/20/2018 12:17 PM EST ----- Negative FIT.

## 2018-01-23 NOTE — Telephone Encounter (Signed)
That is fine to take one.

## 2018-01-24 NOTE — Telephone Encounter (Signed)
Patient is aware that it is fine for her to take only one naproxen. Nat Christen, CMA

## 2018-02-23 ENCOUNTER — Other Ambulatory Visit: Payer: Self-pay

## 2018-02-23 ENCOUNTER — Encounter (HOSPITAL_COMMUNITY): Payer: Self-pay | Admitting: Emergency Medicine

## 2018-02-23 ENCOUNTER — Ambulatory Visit (HOSPITAL_COMMUNITY)
Admission: EM | Admit: 2018-02-23 | Discharge: 2018-02-23 | Disposition: A | Discharge status: Home / Self Care | Payer: Medicare Other | Attending: Family Medicine | Admitting: Family Medicine

## 2018-02-23 DIAGNOSIS — J069 Acute upper respiratory infection, unspecified: Secondary | ICD-10-CM | POA: Diagnosis not present

## 2018-02-23 DIAGNOSIS — B9789 Other viral agents as the cause of diseases classified elsewhere: Secondary | ICD-10-CM | POA: Diagnosis not present

## 2018-02-23 MED ORDER — CETIRIZINE HCL 5 MG PO TABS: 5.0000 mg | ORAL_TABLET | Freq: Every day | ORAL | 1 refills | Status: DC

## 2018-02-23 MED ORDER — FLUTICASONE PROPIONATE 50 MCG/ACT NA SUSP: 1.0000 | Freq: Every day | NASAL | 2 refills | Status: DC

## 2018-02-23 MED ORDER — GUAIFENESIN ER 600 MG PO TB12: 600.0000 mg | ORAL_TABLET | Freq: Two times a day (BID) | ORAL | 0 refills | Status: DC

## 2018-02-23 MED FILL — FLUTICASONE PROP 50 MCG SPR: 50 | 30 days supply | Qty: 16 | Fill #0

## 2018-02-23 NOTE — Discharge Instructions (Signed)
I believe this is a viral upper respiratory infection We will do Zyrtec daily for runny nose and congestion Mucinex for cough and chest congestion Flonase nasal spray for nasal congestion and inflammation Follow up as needed for continued or worsening symptoms

## 2018-02-23 NOTE — ED Provider Notes (Signed)
[place]    CSN: [id number] Arrival date & time: 02/23/18  1026     History   Chief Complaint Chief Complaint  Patient presents with  . URI    HPI Brandy Stewart is a 68 y.o. female.   Pt is a 68 year old female presents with cough, congestion, runny nose for the past couple days.  Her symptoms have been constant.  She has not take anything for symptoms.  She denies any associated fevers, chills, body aches.  Denies any recent sick contacts.  No chest pain, shortness of breath. She does not smoke.   ROS per HPI      Past Medical History:  Diagnosis Date  . Leukemia ([place])     There are no active problems to display for this patient.   Past Surgical History:  Procedure Laterality Date  . PARTIAL HYSTERECTOMY      OB History   No obstetric history on file.      Home Medications    Prior to Admission medications   Medication Sig Start Date End Date Taking? Authorizing Provider  cetirizine (ZYRTEC) 5 MG tablet Take 1 tablet (5 mg total) by mouth daily. 02/23/18   Dionisia Pacholski, Tressia Miners A, NP  fluticasone (FLONASE) 50 MCG/ACT nasal spray Place 1 spray into both nostrils daily. 02/23/18   Loura Halt A, NP  guaiFENesin (MUCINEX) 600 MG 12 hr tablet Take 1 tablet (600 mg total) by mouth 2 (two) times daily. 02/23/18   Orvan July, NP    Family History Family History  Problem Relation Age of Onset  . Diabetes Mother   . Hypertension Mother   . Diabetes Father   . Hypertension Father     Social History Social History   Tobacco Use  . Smoking status: Never Smoker  . Smokeless tobacco: Never Used  Substance Use Topics  . Alcohol use: No  . Drug use: No     Allergies   Penicillins   Review of Systems Review of Systems   Physical Exam Triage Vital Signs ED Triage Vitals [02/23/18 1044]  Enc Vitals Group     BP (!) 147/70     Pulse Rate 77     Resp 16     Temp 98.5 F (36.9 C)     Temp Source Oral     SpO2 98 %     Weight      Height      Head Circumference      Peak Flow      Pain Score 4     Pain Loc      Pain Edu?      Excl. in [place]?    No data found.  Updated Vital Signs BP (!) 147/70 (BP Location: Left Arm)   Pulse 77   Temp 98.5 F (36.9 C) (Oral)   Resp 16   SpO2 98%   Visual Acuity Right Eye Distance:   Left Eye Distance:   Bilateral Distance:    Right Eye Near:   Left Eye Near:    Bilateral Near:     Physical Exam Vitals signs and nursing note reviewed.  Constitutional:      General: She is not in acute distress.    Appearance: Normal appearance. She is not ill-appearing, toxic-appearing or diaphoretic.  HENT:     Head: Normocephalic and atraumatic.     Right Ear: Tympanic membrane, ear canal and external ear normal.     Left Ear: Tympanic membrane, ear canal and  external ear normal.     Nose: Congestion present.     Right Turbinates: Swollen.     Left Turbinates: Swollen.     Mouth/Throat:     Mouth: Mucous membranes are moist.     Pharynx: Oropharynx is clear. No posterior oropharyngeal erythema or uvula swelling.     Tonsils: No tonsillar exudate. Swelling: 0 on the right. 0 on the left.  Eyes:     Conjunctiva/sclera: Conjunctivae normal.  Neck:     Musculoskeletal: Normal range of motion.  Cardiovascular:     Rate and Rhythm: Normal rate and regular rhythm.     Pulses: Normal pulses.     Heart sounds: Normal heart sounds.  Pulmonary:     Effort: Pulmonary effort is normal.     Breath sounds: Normal breath sounds.  Musculoskeletal: Normal range of motion.  Lymphadenopathy:     Cervical: No cervical adenopathy.  Skin:    General: Skin is warm and dry.  Neurological:     Mental Status: She is alert.  Psychiatric:        Mood and Affect: Mood normal.      UC Treatments / Results  Labs (all labs ordered are listed, but only abnormal results are displayed) Labs Reviewed - No data to display  EKG None  Radiology No results found.  Procedures Procedures (including  critical care time)  Medications Ordered in UC Medications - No data to display  Initial Impression / Assessment and Plan / UC Course  I have reviewed the triage vital signs and the nursing notes.  Pertinent labs & imaging results that were available during my care of the patient were reviewed by me and considered in my medical decision making (see chart for details).     Viral URI-  Mucinex for cough, congestion Flonase nasal spray for nasal congestion Zyrtec for congestion runny nose Follow up as needed for continued or worsening symptoms  Final Clinical Impressions(s) / UC Diagnoses   Final diagnoses:  Viral URI with cough     Discharge Instructions     I believe this is a viral upper respiratory infection We will do Zyrtec daily for runny nose and congestion Mucinex for cough and chest congestion Flonase nasal spray for nasal congestion and inflammation Follow up as needed for continued or worsening symptoms     ED Prescriptions    Medication Sig Dispense Auth. Provider   cetirizine (ZYRTEC) 5 MG tablet Take 1 tablet (5 mg total) by mouth daily. 30 tablet Keidrick Murty A, NP   fluticasone (FLONASE) 50 MCG/ACT nasal spray Place 1 spray into both nostrils daily. 16 g Kairyn Olmeda A, NP   guaiFENesin (MUCINEX) 600 MG 12 hr tablet Take 1 tablet (600 mg total) by mouth 2 (two) times daily. 15 tablet Loura Halt A, NP     Controlled Substance Prescriptions [place] Controlled Substance Registry consulted? Not Applicable   Orvan July, NP 02/23/18 1115

## 2018-02-23 NOTE — ED Triage Notes (Signed)
Patient reports symptoms starting  2 days ago.  Patient has been coughing, chest sore with coughing and runny nose

## 2018-03-15 ENCOUNTER — Encounter (HOSPITAL_COMMUNITY): Payer: Self-pay

## 2018-03-15 ENCOUNTER — Ambulatory Visit (HOSPITAL_COMMUNITY)
Admission: EM | Admit: 2018-03-15 | Discharge: 2018-03-15 | Disposition: A | Discharge status: Home / Self Care | Payer: Medicare Other | Attending: Family Medicine | Admitting: Family Medicine

## 2018-03-15 DIAGNOSIS — J019 Acute sinusitis, unspecified: Secondary | ICD-10-CM | POA: Diagnosis not present

## 2018-03-15 MED ORDER — DOXYCYCLINE HYCLATE 100 MG PO CAPS: 100.0000 mg | ORAL_CAPSULE | Freq: Two times a day (BID) | ORAL | 0 refills | Status: AC

## 2018-03-15 MED ORDER — BENZONATATE 200 MG PO CAPS: 200.0000 mg | ORAL_CAPSULE | Freq: Three times a day (TID) | ORAL | 0 refills | Status: AC | PRN

## 2018-03-15 MED ORDER — HYDROCODONE-HOMATROPINE 5-1.5 MG/5ML PO SYRP: 5.0000 mL | ORAL_SOLUTION | Freq: Every evening | ORAL | 0 refills | Status: DC | PRN

## 2018-03-15 MED FILL — DOXYCYCLINE HYCLATE 100 MG: 100 | 10 days supply | Qty: 20 | Fill #0

## 2018-03-15 NOTE — ED Triage Notes (Signed)
Pt states here 2wks ago for same with no relief. C/o productive cough with green sputum and sore throat

## 2018-03-15 NOTE — ED Provider Notes (Signed)
[place]    CSN: [id number] Arrival date & time: 03/15/18  5035     History   Chief Complaint Chief Complaint  Patient presents with  . Cough    HPI Brandy Stewart is a 68 y.o. female history of previous leukemia presenting today for evaluation of URI symptoms.  Patient states that she has had a productive cough, with associated nasal congestion.  She is also had a sore throat.  Symptoms began approximately 2 weeks ago.  She was seen here on 1/9 and treated for viral URI and recommended Zyrtec, Flonase and Mucinex.  She has not had any improvement with using these medicines consistently over the past 2 weeks.  She denies any fevers.  Denies any shortness of breath or chest discomfort.  She denies any nausea vomiting or diarrhea.  HPI  Past Medical History:  Diagnosis Date  . Leukemia ([place])     There are no active problems to display for this patient.   Past Surgical History:  Procedure Laterality Date  . PARTIAL HYSTERECTOMY      OB History   No obstetric history on file.      Home Medications    Prior to Admission medications   Medication Sig Start Date End Date Taking? Authorizing Provider  benzonatate (TESSALON) 200 MG capsule Take 1 capsule (200 mg total) by mouth 3 (three) times daily as needed for up to 7 days for cough. 03/15/18 03/22/18  Drucilla Cumber C, PA-C  cetirizine (ZYRTEC) 5 MG tablet Take 1 tablet (5 mg total) by mouth daily. 02/23/18   Loura Halt A, NP  doxycycline (VIBRAMYCIN) 100 MG capsule Take 1 capsule (100 mg total) by mouth 2 (two) times daily for 10 days. 03/15/18 03/25/18  Endia Moncur C, PA-C  fluticasone (FLONASE) 50 MCG/ACT nasal spray Place 1 spray into both nostrils daily. 02/23/18   Loura Halt A, NP  guaiFENesin (MUCINEX) 600 MG 12 hr tablet Take 1 tablet (600 mg total) by mouth 2 (two) times daily. 02/23/18   Bast, Tressia Miners A, NP  HYDROcodone-homatropine (HYCODAN) 5-1.5 MG/5ML syrup Take 5 mLs by mouth at bedtime as needed for  cough. 03/15/18   Massa Pe, Elesa Hacker, PA-C    Family History Family History  Problem Relation Age of Onset  . Diabetes Mother   . Hypertension Mother   . Diabetes Father   . Hypertension Father     Social History Social History   Tobacco Use  . Smoking status: Never Smoker  . Smokeless tobacco: Never Used  Substance Use Topics  . Alcohol use: No  . Drug use: No     Allergies   Penicillins   Review of Systems Review of Systems  Constitutional: Negative for activity change, appetite change, chills, fatigue and fever.  HENT: Positive for congestion, rhinorrhea, sinus pressure and sore throat. Negative for ear pain and trouble swallowing.   Eyes: Negative for discharge and redness.  Respiratory: Positive for cough. Negative for chest tightness and shortness of breath.   Cardiovascular: Negative for chest pain.  Gastrointestinal: Negative for abdominal pain, diarrhea, nausea and vomiting.  Musculoskeletal: Negative for myalgias.  Skin: Negative for rash.  Neurological: Negative for dizziness, light-headedness and headaches.     Physical Exam Triage Vital Signs ED Triage Vitals  Enc Vitals Group     BP 03/15/18 0930 121/72     Pulse Rate 03/15/18 0930 83     Resp 03/15/18 0930 18     Temp 03/15/18 0930 98.7 F (37.1  C)     Temp Source 03/15/18 0930 Oral     SpO2 03/15/18 0930 97 %     Weight --      Height --      Head Circumference --      Peak Flow --      Pain Score 03/15/18 0931 0     Pain Loc --      Pain Edu? --      Excl. in [place]? --    No data found.  Updated Vital Signs BP 121/72 (BP Location: Left Arm)   Pulse 83   Temp 98.7 F (37.1 C) (Oral)   Resp 18   SpO2 97%   Visual Acuity Right Eye Distance:   Left Eye Distance:   Bilateral Distance:    Right Eye Near:   Left Eye Near:    Bilateral Near:     Physical Exam Vitals signs and nursing note reviewed.  Constitutional:      General: She is not in acute distress.    Appearance: She  is well-developed.  HENT:     Head: Normocephalic and atraumatic.     Ears:     Comments: Bilateral ears without tenderness to palpation of external auricle, tragus and mastoid, EAC's without erythema or swelling, TM's with good bony landmarks and cone of light. Non erythematous.    Nose:     Comments: Nasal mucosa pink, mildly erythematous    Mouth/Throat:     Comments: Oral mucosa pink and moist, no tonsillar enlargement or exudate. Posterior pharynx patent and nonerythematous, no uvula deviation or swelling. Normal phonation. Eyes:     Conjunctiva/sclera: Conjunctivae normal.  Neck:     Musculoskeletal: Neck supple.  Cardiovascular:     Rate and Rhythm: Normal rate and regular rhythm.     Heart sounds: No murmur.  Pulmonary:     Effort: Pulmonary effort is normal. No respiratory distress.     Breath sounds: Normal breath sounds.     Comments: Breathing comfortably at rest, CTABL, no wheezing, rales or other adventitious sounds auscultated Abdominal:     Palpations: Abdomen is soft.     Tenderness: There is no abdominal tenderness.  Skin:    General: Skin is warm and dry.  Neurological:     Mental Status: She is alert.      UC Treatments / Results  Labs (all labs ordered are listed, but only abnormal results are displayed) Labs Reviewed - No data to display  EKG None  Radiology No results found.  Procedures Procedures (including critical care time)  Medications Ordered in UC Medications - No data to display  Initial Impression / Assessment and Plan / UC Course  I have reviewed the triage vital signs and the nursing notes.  Pertinent labs & imaging results that were available during my care of the patient were reviewed by me and considered in my medical decision making (see chart for details).     Patient with persistent URI symptoms greater than 2 weeks.  Vital signs stable in clinic, exam nonfocal.  Will treat patient with doxycycline to cover for sinusitis  as well as atypical respiratory illness.  Lungs clear, do not suspect underlying pneumonia at this time.  Also provide cough syrup to use at nighttime, Hycodan provided.  Tessalon for use during the day for cough.  Discussed using medicines provided at previous visit for further continued management of congestion.  Continue to monitor for resolution of symptoms, breathing and temperature,Discussed strict  return precautions. Patient verbalized understanding and is agreeable with plan.  Final Clinical Impressions(s) / UC Diagnoses   Final diagnoses:  Acute sinusitis with symptoms > 10 days     Discharge Instructions     Please begin taking doxycycline twice daily for the next 10 days. Please use Tessalon every 8 hours as needed for cough May use Hycodan cough syrup at bedtime, this will cause drowsiness, do not drive or work after taking Continue to use daily cetirizine and/or Mucinex for further congestion management Tylenol and ibuprofen as needed for body aches, headache Drink plenty of fluids  Honey Tea: Use 3 teaspoons of honey with juice squeezed from half lemon. Place shaved pieces of ginger into 1/2-1 cup of water and warm over stove top. Then mix the ingredients and repeat every 4 hours as needed.   ED Prescriptions    Medication Sig Dispense Auth. Provider   doxycycline (VIBRAMYCIN) 100 MG capsule Take 1 capsule (100 mg total) by mouth 2 (two) times daily for 10 days. 20 capsule Suha Schoenbeck C, PA-C   HYDROcodone-homatropine (HYCODAN) 5-1.5 MG/5ML syrup Take 5 mLs by mouth at bedtime as needed for cough. 60 mL Sondi Desch C, PA-C   benzonatate (TESSALON) 200 MG capsule Take 1 capsule (200 mg total) by mouth 3 (three) times daily as needed for up to 7 days for cough. 28 capsule Quinlee Sciarra C, PA-C     Controlled Substance Prescriptions [place] Controlled Substance Registry consulted? Yes, I have consulted the [place] Controlled Substances Registry for this patient, and feel the  risk/benefit ratio today is favorable for proceeding with this prescription for a controlled substance.   Janith Lima, PA-C 03/15/18 1041

## 2018-03-15 NOTE — Discharge Instructions (Signed)
Please begin taking doxycycline twice daily for the next 10 days. Please use Tessalon every 8 hours as needed for cough May use Hycodan cough syrup at bedtime, this will cause drowsiness, do not drive or work after taking Continue to use daily cetirizine and/or Mucinex for further congestion management Tylenol and ibuprofen as needed for body aches, headache Drink plenty of fluids  Honey Tea: Use 3 teaspoons of honey with juice squeezed from half lemon. Place shaved pieces of ginger into 1/2-1 cup of water and warm over stove top. Then mix the ingredients and repeat every 4 hours as needed.

## 2018-03-31 ENCOUNTER — Encounter (HOSPITAL_COMMUNITY): Payer: Self-pay | Admitting: Emergency Medicine

## 2018-03-31 ENCOUNTER — Ambulatory Visit (HOSPITAL_COMMUNITY)
Admission: EM | Admit: 2018-03-31 | Discharge: 2018-03-31 | Disposition: A | Discharge status: Home / Self Care | Payer: Medicare Other | Attending: Family Medicine | Admitting: Family Medicine

## 2018-03-31 DIAGNOSIS — N390 Urinary tract infection, site not specified: Secondary | ICD-10-CM

## 2018-03-31 LAB — POCT URINALYSIS DIP (DEVICE)
Bilirubin Urine: NEGATIVE
GLUCOSE, UA: NEGATIVE mg/dL
Ketones, ur: NEGATIVE mg/dL
Nitrite: NEGATIVE
Protein, ur: NEGATIVE mg/dL
Specific Gravity, Urine: 1.02 (ref 1.005–1.030)
Urobilinogen, UA: 0.2 mg/dL (ref 0.0–1.0)
pH: 6 (ref 5.0–8.0)

## 2018-03-31 MED ORDER — SULFAMETHOXAZOLE-TRIMETHOPRIM 800-160 MG PO TABS: 1.0000 | ORAL_TABLET | Freq: Two times a day (BID) | ORAL | 0 refills | Status: AC

## 2018-03-31 MED FILL — SULFAMETHOXAZOLE-TMP DS TAB: 800-160 | 7 days supply | Qty: 14 | Fill #0
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
## 2018-03-31 NOTE — ED Provider Notes (Addendum)
[place]    CSN: [id number] Arrival date & time: 03/31/18  0865     History   Chief Complaint Chief Complaint  Patient presents with  . Dysuria  . Back Pain    HPI Brandy Stewart is a 68 y.o. female.   Established patient  6 day history of burning with urination, urinary frequency,  and left lower back pain.   No fever, flank pain, or hematuria.     Past Medical History:  Diagnosis Date  . Leukemia ([place])     There are no active problems to display for this patient.   Past Surgical History:  Procedure Laterality Date  . PARTIAL HYSTERECTOMY      OB History   No obstetric history on file.      Home Medications    Prior to Admission medications   Medication Sig Start Date End Date Taking? Authorizing Provider  sulfamethoxazole-trimethoprim (BACTRIM DS,SEPTRA DS) 800-160 MG tablet Take 1 tablet by mouth 2 (two) times daily for 7 days. 03/31/18 04/07/18  Robyn Haber, MD    Family History Family History  Problem Relation Age of Onset  . Diabetes Mother   . Hypertension Mother   . Diabetes Father   . Hypertension Father     Social History Social History   Tobacco Use  . Smoking status: Never Smoker  . Smokeless tobacco: Never Used  Substance Use Topics  . Alcohol use: No  . Drug use: No     Allergies   Penicillins   Review of Systems Review of Systems  Constitutional: Negative for activity change.  HENT: Negative for congestion.   Respiratory: Negative for chest tightness and shortness of breath.   Gastrointestinal: Negative for abdominal pain.  Genitourinary: Positive for dysuria, frequency and urgency. Negative for vaginal discharge and vaginal pain.  Musculoskeletal: Positive for back pain.     Physical Exam Triage Vital Signs ED Triage Vitals  Enc Vitals Group     BP 03/31/18 0910 139/87     Pulse Rate 03/31/18 0910 63     Resp 03/31/18 0910 18     Temp 03/31/18 0910 97.9 F (36.6 C)     Temp src --     SpO2 03/31/18 0910 99 %     Weight --      Height --      Head Circumference --      Peak Flow --      Pain Score 03/31/18 0911 4     Pain Loc --      Pain Edu? --      Excl. in [place]? --    No data found.  Updated Vital Signs BP 139/87   Pulse 63   Temp 97.9 F (36.6 C)   Resp 18   SpO2 99%    Physical Exam Vitals signs and nursing note reviewed.  Constitutional:      Appearance: Normal appearance.  Eyes:     Conjunctiva/sclera: Conjunctivae normal.  Neck:     Musculoskeletal: Normal range of motion and neck supple.  Cardiovascular:     Rate and Rhythm: Normal rate and regular rhythm.  Pulmonary:     Effort: Pulmonary effort is normal.     Breath sounds: Rhonchi present.     Comments: Right sided rhonchi not cleared by coughing Abdominal:     General: Bowel sounds are normal.     Tenderness: There is no abdominal tenderness. There is no right CVA tenderness, left CVA tenderness or guarding.  Musculoskeletal:        General: No tenderness.  Skin:    General: Skin is warm and dry.  Neurological:     General: No focal deficit present.     Mental Status: She is alert and oriented to person, place, and time.  Psychiatric:        Mood and Affect: Mood normal.        Behavior: Behavior normal.    Rhonchi cleared after reexam  UC Treatments / Results  Labs (all labs ordered are listed, but only abnormal results are displayed) Labs Reviewed  POCT URINALYSIS DIP (DEVICE) - Abnormal; Notable for the following components:      Result Value   Hgb urine dipstick TRACE (*)    Leukocytes,Ua SMALL (*)    All other components within normal limits  URINE CULTURE    EKG None  Radiology No results found.  Procedures Procedures (including critical care time)  Medications Ordered in UC Medications - No data to display  Initial Impression / Assessment and Plan / UC Course  I have reviewed the triage vital signs and the nursing notes.  Pertinent labs & imaging  results that were available during my care of the patient were reviewed by me and considered in my medical decision making (see chart for details).    Final Clinical Impressions(s) / UC Diagnoses   Final diagnoses:  Lower urinary tract infectious disease   Discharge Instructions   None    ED Prescriptions    Medication Sig Dispense Auth. Provider   sulfamethoxazole-trimethoprim (BACTRIM DS,SEPTRA DS) 800-160 MG tablet Take 1 tablet by mouth 2 (two) times daily for 7 days. 14 tablet Robyn Haber, MD     Controlled Substance Prescriptions [place] Controlled Substance Registry consulted? Not Applicable   Robyn Haber, MD 03/31/18 6568    Robyn Haber, MD 03/31/18 [id number]

## 2018-04-01 LAB — URINE CULTURE: Culture: NO GROWTH

## 2018-04-12 ENCOUNTER — Encounter (INDEPENDENT_AMBULATORY_CARE_PROVIDER_SITE_OTHER): Payer: Self-pay | Admitting: Primary Care

## 2018-04-12 ENCOUNTER — Ambulatory Visit (INDEPENDENT_AMBULATORY_CARE_PROVIDER_SITE_OTHER): Payer: Medicare Other | Admitting: Primary Care

## 2018-04-12 ENCOUNTER — Other Ambulatory Visit: Payer: Self-pay

## 2018-04-12 VITALS — BP 128/70 | HR 76 | Temp 97.9°F | Ht 64.0 in | Wt 230.4 lb

## 2018-04-12 DIAGNOSIS — Z0000 Encounter for general adult medical examination without abnormal findings: Secondary | ICD-10-CM

## 2018-04-12 DIAGNOSIS — G8929 Other chronic pain: Secondary | ICD-10-CM

## 2018-04-12 DIAGNOSIS — Z1231 Encounter for screening mammogram for malignant neoplasm of breast: Secondary | ICD-10-CM

## 2018-04-12 DIAGNOSIS — K029 Dental caries, unspecified: Secondary | ICD-10-CM | POA: Insufficient documentation

## 2018-04-12 DIAGNOSIS — M25562 Pain in left knee: Secondary | ICD-10-CM | POA: Diagnosis not present

## 2018-04-12 DIAGNOSIS — Z6835 Body mass index (BMI) 35.0-35.9, adult: Secondary | ICD-10-CM | POA: Diagnosis not present

## 2018-04-12 DIAGNOSIS — R300 Dysuria: Secondary | ICD-10-CM | POA: Diagnosis not present

## 2018-04-12 DIAGNOSIS — Z780 Asymptomatic menopausal state: Secondary | ICD-10-CM | POA: Insufficient documentation

## 2018-04-12 DIAGNOSIS — M810 Age-related osteoporosis without current pathological fracture: Secondary | ICD-10-CM

## 2018-04-12 DIAGNOSIS — R7303 Prediabetes: Secondary | ICD-10-CM

## 2018-04-12 DIAGNOSIS — Z1239 Encounter for other screening for malignant neoplasm of breast: Secondary | ICD-10-CM

## 2018-04-12 DIAGNOSIS — K5909 Other constipation: Secondary | ICD-10-CM

## 2018-04-12 DIAGNOSIS — K047 Periapical abscess without sinus: Secondary | ICD-10-CM

## 2018-04-12 LAB — POCT URINALYSIS DIP (CLINITEK)
Bilirubin, UA: NEGATIVE
Blood, UA: NEGATIVE
Glucose, UA: NEGATIVE mg/dL
Ketones, POC UA: NEGATIVE mg/dL
Leukocytes, UA: NEGATIVE
Nitrite, UA: NEGATIVE
POC PROTEIN,UA: NEGATIVE
Spec Grav, UA: 1.025 (ref 1.010–1.025)
Urobilinogen, UA: 0.2 E.U./dL
pH, UA: 5.5 (ref 5.0–8.0)

## 2018-04-12 LAB — POCT GLYCOSYLATED HEMOGLOBIN (HGB A1C): Hemoglobin A1C: 5.9 % — AB (ref 4.0–5.6)

## 2018-04-12 MED ORDER — SENNOSIDES-DOCUSATE SODIUM 8.6-50 MG PO TABS: 2.0000 | ORAL_TABLET | Freq: Every day | ORAL | 1 refills | Status: AC

## 2018-04-12 MED ORDER — IBUPROFEN 600 MG PO TABS: 600.0000 mg | ORAL_TABLET | Freq: Three times a day (TID) | ORAL | 0 refills | Status: DC | PRN

## 2018-04-12 MED FILL — IBUPROFEN 600 MG TABLET: 600 | 10 days supply | Qty: 30 | Fill #0

## 2018-04-12 NOTE — Patient Instructions (Signed)
Acute Pain, Adult °Acute pain is a type of pain that may last for just a few days or as long as six months. It is often related to an illness, injury, or medical procedure. Acute pain may be mild, moderate, or severe. It usually goes away once your injury has healed or you are no longer ill. °Pain can make it hard for you to do daily activities. It can cause anxiety and lead to other problems if left untreated. Treatment depends on the cause and severity of your acute pain. °Follow these instructions at home: °· Check your pain level as told by your health care provider. °· Take over-the-counter and prescription medicines only as told by your health care provider. °· If you are taking prescription pain medicine: °? Ask your health care provider about taking a stool softener or laxative to prevent constipation. °? Do not stop taking the medicine suddenly. Talk to your health care provider about how and when to discontinue prescription pain medicine. °? If your pain is severe, do not take more pills than instructed by your health care provider. °? Do not take other over-the-counter pain medicines in addition to this medicine unless told by your health care provider. °? Do not drive or operate heavy machinery while taking prescription pain medicine. °· Apply ice or heat as told by your health care provider. These may reduce swelling and pain. °· Ask your health care provider if other strategies such as distraction, relaxation, or physical therapies can help your pain. °· Keep all follow-up visits as told by your health care provider. This is important. °Contact a health care provider if: °· You have pain that is not controlled by medicine. °· Your pain does not improve or gets worse. °· You have side effects from pain medicines, such as vomiting or confusion. °Get help right away if: °· You have severe pain. °· You have trouble breathing. °· You lose consciousness. °· You have chest pain or pressure that lasts for more  than a few minutes. Along with the chest pain you may: °? Have pain or discomfort in one or both arms, your back, neck, jaw, or stomach. °? Have shortness of breath. °? Break out in a cold sweat. °? Feel nauseous. °? Become light-headed. °These symptoms may represent a serious problem that is an emergency. Do not wait to see if the symptoms will go away. Get medical help right away. Call your local emergency services (911 in the [place] Do not drive yourself to the hospital. °This information is not intended to replace advice given to you by your health care provider. Make sure you discuss any questions you have with your health care provider. °Document Released: 02/16/2015 Document Revised: 07/11/2015 Document Reviewed: 02/16/2015 °Elsevier Interactive Patient Education © 2019 [employer] ° °

## 2018-04-12 NOTE — Progress Notes (Signed)
Established Patient Office Visit  Subjective:  Patient ID: Brandy Stewart, female    DOB: February 25, 1950  Age: 68 y.o. MRN: [id number]  CC:  Chief Complaint  Patient presents with  . Knee Pain  . burning with urination    HPI Brandy Stewart presents forwithamedical history of discoid lupus, prediabetes,and seasonal allergiespresents with c/o left knee pain chronic reviewed several ED visits for the same.     Also has left knee pain since 6 - 7 years. Had left knee exploration and repair.     Past Medical History:  Diagnosis Date  . Leukemia [facility])     Past Surgical History:  Procedure Laterality Date  . PARTIAL HYSTERECTOMY      Family History  Problem Relation Age of Onset  . Diabetes Mother   . Hypertension Mother   . Diabetes Father   . Hypertension Father    Allergies  Allergen Reactions  . Penicillins Rash    Has patient had a PCN reaction causing immediate rash, facial/tongue/throat swelling, SOB or lightheadedness with hypotension: Yes Has patient had a PCN reaction causing severe rash involving mucus membranes or skin necrosis: No Has patient had a PCN reaction that required hospitalization: No Has patient had a PCN reaction occurring within the last 10 years: No If all of the above answers are "NO", then may proceed with Cephalosporin use.     ROS Review of Systems  Constitutional: Negative.   HENT: Negative.   Eyes: Negative.   Respiratory: Negative.   Endocrine: Negative.   Genitourinary: Negative.   Musculoskeletal:       Left knee pain  Skin: Negative.   Allergic/Immunologic: Negative.   Neurological: Negative.   Hematological: Negative.   Psychiatric/Behavioral: Negative.       Objective:    Physical Exam  Constitutional: She is oriented to person, place, and time. She appears well-developed and well-nourished.  HENT:  Head: Normocephalic.  Eyes: Pupils are equal, round, and reactive to light. EOM are normal. Left eye exhibits  no discharge.  Cataract present unable to obtain red reflex  Left eye Cataract on right   Neck: Normal range of motion. Neck supple.  Cardiovascular: Normal rate and regular rhythm.  Pulmonary/Chest: Effort normal and breath sounds normal.  Abdominal: Soft. Bowel sounds are normal.  Musculoskeletal: Normal range of motion.  Neurological: She is oriented to person, place, and time.  Skin: Skin is warm and dry.  Psychiatric: She has a normal mood and affect.    BP 128/70 (BP Location: Left Arm, Patient Position: Sitting, Cuff Size: Large)   Pulse 76   Temp 97.9 F (36.6 C) (Oral)   Ht 5\' 4"  (1.626 m)   Wt 230 lb 6.4 oz (104.5 kg)   SpO2 95%   BMI 39.55 kg/m  Wt Readings from Last 3 Encounters:  04/12/18 230 lb 6.4 oz (104.5 kg)  01/18/18 233 lb 9.6 oz (106 kg)  11/25/17 232 lb (105.2 kg)     Health Maintenance Due  Topic Date Due  . MAMMOGRAM  01/29/2003  . DEXA SCAN  01/28/2018  . PNA vac Low Risk Adult (1 of 2 - PCV13) 01/28/2018    There are no preventive care reminders to display for this patient.  No results found for: TSH Lab Results  Component Value Date   WBC 3.6 (L) 06/24/2017   HGB 12.7 06/24/2017   HCT 39.5 06/24/2017   MCV 93.6 06/24/2017   PLT 269 06/24/2017   Lab Results  Component Value  Date   NA 139 06/24/2017   K 4.4 06/24/2017   CO2 28 06/24/2017   GLUCOSE 96 06/24/2017   BUN 7 06/24/2017   CREATININE 0.71 06/24/2017   BILITOT 0.4 06/24/2017   ALKPHOS 79 06/24/2017   AST 29 06/24/2017   ALT 17 06/24/2017   PROT 7.8 06/24/2017   ALBUMIN 3.5 06/24/2017   CALCIUM 9.0 06/24/2017   ANIONGAP 8 06/24/2017    Lab Results  Component Value Date   HGBA1C 6.0 (A) 09/14/2017      Assessment & Plan:   Problem List Items Addressed This Visit    Dysuria -r/o UTI urinalysis completed negative    Other Visit Diagnoses    Chronic pain of left knee    -  Primary arthroscopy previously 6-7 years ago      Prediabetes       Relevant Orders    HgB A1c 5.8   Healthcare maintenance    Bone density post menopausal and mammogram       Class 2 severe obesity due to excess calories with serious comorbidity in adult, unspecified BMI ([place])    Discussed eating healthier loves making homemade   Biscuits and sweets . Reduce carbs and try walking when weather     Follow-up: Return in about 6 months (around 10/11/2018), or if symptoms worsen or fail to improve.    Kerin Perna, NP

## 2018-04-24 DIAGNOSIS — H2513 Age-related nuclear cataract, bilateral: Secondary | ICD-10-CM | POA: Diagnosis not present

## 2018-07-03 ENCOUNTER — Telehealth: Payer: Self-pay | Admitting: Primary Care

## 2018-07-03 ENCOUNTER — Other Ambulatory Visit: Payer: Self-pay | Admitting: Primary Care

## 2018-07-03 DIAGNOSIS — M25569 Pain in unspecified knee: Secondary | ICD-10-CM

## 2018-07-03 NOTE — Telephone Encounter (Signed)
Patient called wanting to know what she should take for gas states that it is on her back and that she has tried taking something for inflammation on her back. Please follow up.

## 2018-07-03 NOTE — Telephone Encounter (Signed)
Patient was advised and aware of referral being placed.
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
## 2018-07-03 NOTE — Telephone Encounter (Signed)
Please advise on OTC back gas relief.

## 2018-07-03 NOTE — Telephone Encounter (Signed)
Patient was last seen in February NSAIDS not working will refer her to PT for eval and gas x is appropriate better out than in

## 2018-07-04 NOTE — Telephone Encounter (Signed)
mira lax working im glad

## 2018-07-05 ENCOUNTER — Ambulatory Visit: Payer: Medicare Other | Admitting: Primary Care

## 2018-07-11 ENCOUNTER — Other Ambulatory Visit: Payer: Self-pay

## 2018-07-11 ENCOUNTER — Ambulatory Visit: Payer: Medicare Other | Attending: Primary Care | Admitting: Primary Care

## 2018-07-11 ENCOUNTER — Encounter: Payer: Self-pay | Admitting: Primary Care

## 2018-07-11 DIAGNOSIS — K5900 Constipation, unspecified: Secondary | ICD-10-CM

## 2018-07-11 DIAGNOSIS — R109 Unspecified abdominal pain: Secondary | ICD-10-CM

## 2018-07-11 MED ORDER — LACTULOSE 10 GM/15ML PO SOLN: 10.0000 g | Freq: Two times a day (BID) | ORAL | 1 refills | Status: AC | PRN

## 2018-07-11 MED FILL — LACTULOSE 10 GM/15 ML SOLN: 10 | 8 days supply | Qty: 236 | Fill #0

## 2018-07-11 NOTE — Progress Notes (Signed)
Patient verified DOB Patient has not taken medication today. Patient had a smoothie for breakfast. Patient complains of discomfort from the gas more to the left side.

## 2018-07-11 NOTE — Progress Notes (Signed)
Virtual Visit via Telephone Note  I connected with Brandy Stewart on 07/11/18 at 12:10 AM EDT by telephone and verified that I am speaking with the correct person using two identifiers.   I discussed the limitations, risks, security and privacy concerns of performing an evaluation and management service by telephone and the availability of in person appointments. I also discussed with the patient that there may be a patient responsible charge related to this service. The patient expressed understanding and agreed to proceed.   History of Present Illness: Brandy Stewart  Is experiencing constipation and gas and miralax  is no longer working.  Observations/Objective: Review of Systems  Constitutional: Negative.   HENT: Negative.   Eyes: Negative.   Respiratory: Negative.   Cardiovascular: Negative.   Gastrointestinal: Positive for abdominal pain and constipation.  Genitourinary: Negative.   Musculoskeletal: Positive for back pain.  Skin: Negative.     Assessment and Plan: Brandy Stewart was seen today for gas and constipation.  Diagnoses and all orders for this visit:  Constipation, unspecified constipation type Stopped miralax she was told it was not good to take fiber. When bloated   Other orders -     lactulose (CHRONULAC) 10 GM/15ML solution; Take 15 mLs (10 g total) by mouth 2 (two) times daily as needed for up to 30 days for moderate constipation.    Follow Up Instructions:    I discussed the assessment and treatment plan with the patient. The patient was provided an opportunity to ask questions and all were answered. The patient agreed with the plan and demonstrated an understanding of the instructions.   The patient was advised to call back or seek an in-person evaluation if the symptoms worsen or if the condition fails to improve as anticipated.  I provided 10 minutes of non-face-to-face time during this encounter.   Kerin Perna, NP

## 2018-07-25 DIAGNOSIS — H18413 Arcus senilis, bilateral: Secondary | ICD-10-CM | POA: Diagnosis not present

## 2018-07-25 DIAGNOSIS — H2513 Age-related nuclear cataract, bilateral: Secondary | ICD-10-CM | POA: Diagnosis not present

## 2018-07-25 DIAGNOSIS — H2512 Age-related nuclear cataract, left eye: Secondary | ICD-10-CM | POA: Diagnosis not present

## 2018-07-25 DIAGNOSIS — H25013 Cortical age-related cataract, bilateral: Secondary | ICD-10-CM | POA: Diagnosis not present

## 2018-07-25 DIAGNOSIS — H25043 Posterior subcapsular polar age-related cataract, bilateral: Secondary | ICD-10-CM | POA: Diagnosis not present

## 2018-07-27 MED FILL — DUREZOL 0.05% EYE DROPS: 0.05 | 30 days supply | Qty: 5 | Fill #0

## 2018-07-27 MED FILL — MOXIFLOXACIN HCL 0.5 % SOLN: 0.5 | 12 days supply | Qty: 3 | Fill #0

## 2018-07-27 MED FILL — KETOROLAC 0.5% OPHTH SOLUTI: 0.5 | 25 days supply | Qty: 5 | Fill #0

## 2018-08-03 DIAGNOSIS — H2512 Age-related nuclear cataract, left eye: Secondary | ICD-10-CM | POA: Diagnosis not present

## 2018-08-04 DIAGNOSIS — H2511 Age-related nuclear cataract, right eye: Secondary | ICD-10-CM | POA: Diagnosis not present

## 2018-08-04 DIAGNOSIS — H2512 Age-related nuclear cataract, left eye: Secondary | ICD-10-CM | POA: Diagnosis not present

## 2018-08-07 MED FILL — VIGAMOX 0.5% EYE DROPS: 0.5 | 29 days supply | Qty: 1 | Fill #0

## 2018-08-07 MED FILL — DUREZOL 0.05% EYE DROPS: 0.05 | 27 days supply | Qty: 1 | Fill #0

## 2018-08-07 MED FILL — KETOROLAC 0.5% OPHTH SOLN: 0.5 | 15 days supply | Qty: 10 | Fill #0

## 2018-08-21 DIAGNOSIS — H2511 Age-related nuclear cataract, right eye: Secondary | ICD-10-CM | POA: Diagnosis not present

## 2018-08-23 MED FILL — MOXIFLOXACIN 0.5% EYE DROPS: 0.5 | 11 days supply | Qty: 3 | Fill #1

## 2018-08-23 MED FILL — KETOROLAC 0.5% OPHTH SOLN: 0.5 | 15 days supply | Qty: 10 | Fill #1

## 2018-08-25 MED FILL — DUREZOL 0.05% EYE DROPS: 0.05 | 25 days supply | Qty: 5 | Fill #1

## 2018-09-04 MED FILL — KETOROLAC 0.5% OPHTH SOLN: 0.5 | 22 days supply | Qty: 6 | Fill #1

## 2018-09-04 MED FILL — MOXIFLOXACIN HCL 0.5 % SOLN: 0.5 | 12 days supply | Qty: 3 | Fill #1

## 2018-09-14 MED FILL — DUREZOL 0.05% EYE DROPS: 0.05 | 30 days supply | Qty: 5 | Fill #1

## 2018-09-29 ENCOUNTER — Ambulatory Visit (HOSPITAL_COMMUNITY)
Admission: EM | Admit: 2018-09-29 | Discharge: 2018-09-29 | Disposition: A | Discharge status: Home / Self Care | Payer: Medicare Other | Attending: Emergency Medicine | Admitting: Emergency Medicine

## 2018-09-29 ENCOUNTER — Encounter (HOSPITAL_COMMUNITY): Payer: Self-pay

## 2018-09-29 DIAGNOSIS — R300 Dysuria: Secondary | ICD-10-CM | POA: Diagnosis present

## 2018-09-29 LAB — POCT URINALYSIS DIP (DEVICE)
Bilirubin Urine: NEGATIVE
Glucose, UA: NEGATIVE mg/dL
Hgb urine dipstick: NEGATIVE
Ketones, ur: NEGATIVE mg/dL
Nitrite: NEGATIVE
Protein, ur: NEGATIVE mg/dL
Specific Gravity, Urine: >=1.03 (ref 1.005–1.030)
Urobilinogen, UA: 0.2 mg/dL (ref 0.0–1.0)
pH: 5.5 (ref 5.0–8.0)

## 2018-09-29 NOTE — ED Triage Notes (Signed)
Pt here for dysuria that began this week and having flank pain in the middle of her back. States she does have a hx of back pain years ago. Also complains of centralized chest pain as if she pulled a muscle wrong when she was trying to get out the backseat of the car. No numbness tingling or radiating pain reported.

## 2018-09-29 NOTE — Discharge Instructions (Signed)
Your urine doesn't indicate infection today.  However, it is quite concentrated which can certainly cause burning sensation.  I would like to swab your vagina as well to ensure no yeast or bacteria present which may be contributing to your discomfort.  Please increase your fluid intake as this will likely help your discomfort with urination.  Will notify you of any positive findings from urine and vaginal swab and if any changes to treatment are needed.   If any worsening of symptoms- blood in urine, fever, or pelvic pain, please return to be seen.
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
## 2018-09-30 LAB — URINE CULTURE: Culture: NO GROWTH

## 2018-10-03 LAB — CERVICOVAGINAL ANCILLARY ONLY
Bacterial vaginitis: NEGATIVE
Candida vaginitis: NEGATIVE
Chlamydia: NEGATIVE
Neisseria Gonorrhea: NEGATIVE
Trichomonas: NEGATIVE

## 2018-10-11 ENCOUNTER — Ambulatory Visit (INDEPENDENT_AMBULATORY_CARE_PROVIDER_SITE_OTHER): Payer: Medicare Other | Admitting: Primary Care

## 2018-10-25 ENCOUNTER — Encounter (INDEPENDENT_AMBULATORY_CARE_PROVIDER_SITE_OTHER): Payer: Self-pay | Admitting: Primary Care

## 2018-10-25 ENCOUNTER — Ambulatory Visit (INDEPENDENT_AMBULATORY_CARE_PROVIDER_SITE_OTHER): Payer: Medicare Other | Admitting: Primary Care

## 2018-10-25 VITALS — BP 148/69 | HR 71 | Temp 98.2°F | Ht 64.0 in | Wt 233.0 lb

## 2018-10-25 DIAGNOSIS — M810 Age-related osteoporosis without current pathological fracture: Secondary | ICD-10-CM

## 2018-10-25 DIAGNOSIS — J302 Other seasonal allergic rhinitis: Secondary | ICD-10-CM | POA: Diagnosis not present

## 2018-10-25 DIAGNOSIS — Z0000 Encounter for general adult medical examination without abnormal findings: Secondary | ICD-10-CM

## 2018-10-25 DIAGNOSIS — Z23 Encounter for immunization: Secondary | ICD-10-CM

## 2018-10-25 DIAGNOSIS — M238X2 Other internal derangements of left knee: Secondary | ICD-10-CM | POA: Diagnosis not present

## 2018-10-25 DIAGNOSIS — R030 Elevated blood-pressure reading, without diagnosis of hypertension: Secondary | ICD-10-CM

## 2018-10-25 DIAGNOSIS — Z6839 Body mass index (BMI) 39.0-39.9, adult: Secondary | ICD-10-CM

## 2018-10-25 DIAGNOSIS — Z1239 Encounter for other screening for malignant neoplasm of breast: Secondary | ICD-10-CM

## 2018-10-25 DIAGNOSIS — M8580 Other specified disorders of bone density and structure, unspecified site: Secondary | ICD-10-CM

## 2018-10-25 NOTE — Progress Notes (Signed)
Pain in neck on right side with swallowing  Burning in chest  Pain in foot

## 2018-10-25 NOTE — Progress Notes (Signed)
Established Patient Office Visit  Subjective:  Patient ID: Brandy Stewart, female    DOB: 03-Jul-1950  Age: 68 y.o. MRN: [id number]  CC:  Chief Complaint  Patient presents with  . Knee Pain    HPI Brandy Stewart presents for bilateral knee pain.  The pain is affecting her gait causing instability, knees are swelling and complains of stiffness after sitting a period of time and hard to get up from a sitting position.  Past Medical History:  Diagnosis Date  . Leukemia [facility])     Past Surgical History:  Procedure Laterality Date  . PARTIAL HYSTERECTOMY      Family History  Problem Relation Age of Onset  . Diabetes Mother   . Hypertension Mother   . Diabetes Father   . Hypertension Father     Social History   Socioeconomic History  . Marital status: Widowed    Spouse name: Not on file  . Number of children: Not on file  . Years of education: Not on file  . Highest education level: Not on file  Occupational History  . Not on file  Social Needs  . Financial resource strain: Not on file  . Food insecurity    Worry: Not on file    Inability: Not on file  . Transportation needs    Medical: Not on file    Non-medical: Not on file  Tobacco Use  . Smoking status: Never Smoker  . Smokeless tobacco: Never Used  Substance and Sexual Activity  . Alcohol use: No  . Drug use: No  . Sexual activity: Yes    Comment: recently with partner of 1 year  Lifestyle  . Physical activity    Days per week: Not on file    Minutes per session: Not on file  . Stress: Not on file  Relationships  . Social Herbalist on phone: Not on file    Gets together: Not on file    Attends religious service: Not on file    Active member of club or organization: Not on file    Attends meetings of clubs or organizations: Not on file    Relationship status: Not on file  . Intimate partner violence    Fear of current or ex partner: Not on file    Emotionally abused: Not on file   Physically abused: Not on file    Forced sexual activity: Not on file  Other Topics Concern  . Not on file  Social History Narrative  . Not on file    Outpatient Medications Prior to Visit  Medication Sig Dispense Refill  . ibuprofen (ADVIL,MOTRIN) 600 MG tablet Take 1 tablet (600 mg total) by mouth every 8 (eight) hours as needed. 30 tablet 0   No facility-administered medications prior to visit.     Allergies  Allergen Reactions  . Penicillins Rash    Has patient had a PCN reaction causing immediate rash, facial/tongue/throat swelling, SOB or lightheadedness with hypotension: Yes Has patient had a PCN reaction causing severe rash involving mucus membranes or skin necrosis: No Has patient had a PCN reaction that required hospitalization: No Has patient had a PCN reaction occurring within the last 10 years: No If all of the above answers are "NO", then may proceed with Cephalosporin use.     ROS Review of Systems  Musculoskeletal: Positive for gait problem and joint swelling.       Bilateral knee pain  All other systems reviewed and are  negative.     Objective:    Physical Exam  Constitutional: She is oriented to person, place, and time. She appears well-developed.  HENT:  Head: Normocephalic.  Neck: Neck supple.  Cardiovascular: Normal rate and regular rhythm.  Pulmonary/Chest: Effort normal and breath sounds normal.  Abdominal: Soft. Bowel sounds are normal. She exhibits distension.  Musculoskeletal:        General: Tenderness and edema present.  Neurological: She is alert and oriented to person, place, and time.  Skin: Skin is warm and dry.  Psychiatric: She has a normal mood and affect.    BP (!) 148/69 (BP Location: Left Arm, Patient Position: Sitting, Cuff Size: Large)   Pulse 71   Temp 98.2 F (36.8 C) (Oral)   Ht 5' 4"  (1.626 m)   Wt 233 lb (105.7 kg)   SpO2 94%   BMI 39.99 kg/m  Wt Readings from Last 3 Encounters:  10/25/18 233 lb (105.7 kg)   09/29/18 200 lb (90.7 kg)  04/12/18 230 lb 6.4 oz (104.5 kg)     Health Maintenance Due  Topic Date Due  . MAMMOGRAM  01/29/2003  . DEXA SCAN  01/28/2018  . URINE MICROALBUMIN  11/26/2018    There are no preventive care reminders to display for this patient.  No results found for: TSH Lab Results  Component Value Date   WBC 4.1 10/25/2018   HGB 12.8 10/25/2018   HCT 39.1 10/25/2018   MCV 92 10/25/2018   PLT 281 10/25/2018   Lab Results  Component Value Date   NA 139 06/24/2017   K 4.4 06/24/2017   CO2 28 06/24/2017   GLUCOSE 96 06/24/2017   BUN 7 06/24/2017   CREATININE 0.71 06/24/2017   BILITOT 0.4 06/24/2017   ALKPHOS 79 06/24/2017   AST 29 06/24/2017   ALT 17 06/24/2017   PROT 7.8 06/24/2017   ALBUMIN 3.5 06/24/2017   CALCIUM 9.0 06/24/2017   ANIONGAP 8 06/24/2017   Lab Results  Component Value Date   CHOL 149 10/25/2018   Lab Results  Component Value Date   HDL 51 10/25/2018   No results found for: [facility] Lab Results  Component Value Date   TRIG 87 10/25/2018   Lab Results  Component Value Date   CHOLHDL 2.9 10/25/2018   Lab Results  Component Value Date   HGBA1C 5.9 (A) 04/12/2018      Assessment & Plan:   Brandy Stewart was seen today for knee pain.  Diagnoses and all orders for this visit:  Breast cancer screening   -     MM Digital Diagnostic Bilat; Future  Osteopenia after menopause Recommend a bone density test recommended to menopause to determine FRAX score and the need of pharmaceutical treatment   Class 2 severe obesity due to excess calories with serious comorbidity in adult, unspecified BMI ([place]) Obesity is 30-39 indicating an excess in caloric intake or underlining conditions. This may lead to other co-morbidities. Lifestyle modifications of diet and exercise may reduce obesity.  -     Lipid Panel  Elevated blood pressure reading in office without diagnosis of hypertension Counseled on low-sodium, DASH diet, medication  compliance, 150 minutes of moderate intensity exercise per week. Discussed medication compliance, adverse effects. -     CBC with Differential -     CMP14+EGFR; Future  Health care maintenance -     Pneumococcal conjugate vaccine 13-valent CDC recommendations for pneumococcal vaccination to help prevent the spread of pneumococcal disease.  Pneumonia, is an  infection of the lungs symptoms can be mild to severe and bobo, suddenly can include cough fever and trouble breathing, this can be prevented and treated.  Pneumonia vaccines will lower your risk of developing pneumonia.   Seasonal allergies  Use Flonase nasal spray for at least duration of your allergy season.  - For appropriate administration of the nasal spray, clear the nose, use opposite hand for opposite nare, sniff gently, exhale through your mouth. - For maximal effect take these two nasal sprays at least 30 minutes apart. - Continue Allegra, Claritin or Zyrtec each day, as needed. - Drink at least 64 ounces of water each day. - If you have a humidifier use it nightly. - Remove as many irritants/allergies as you are able to, no pets in the bedroom, change air filters in air vents.   Crepitus of joint of left knee Work on losing weight to help reduce joint pain. May alternate with heat and ice application for pain relief. May also alternate with acetaminophen and Ibuprofen as prescribed pain relief. Other alternatives include massage, acupuncture and water aerobics.  You must stay active and avoid a sedentary lifestyle. -     Ambulatory referral to Orthopedic Surgery    No orders of the defined types were placed in this encounter.   Follow-up: Return in about 3 months (around 01/24/2019) for HTN, weight .    Kerin Perna, NP

## 2018-10-25 NOTE — Patient Instructions (Signed)

## 2018-10-26 LAB — CBC WITH DIFFERENTIAL/PLATELET
Basophils Absolute: 0 10*3/uL (ref 0.0–0.2)
Basos: 1 %
EOS (ABSOLUTE): 0 10*3/uL (ref 0.0–0.4)
Eos: 1 %
Hematocrit: 39.1 % (ref 34.0–46.6)
Hemoglobin: 12.8 g/dL (ref 11.1–15.9)
Immature Grans (Abs): 0 10*3/uL (ref 0.0–0.1)
Immature Granulocytes: 0 %
Lymphocytes Absolute: 2.4 10*3/uL (ref 0.7–3.1)
Lymphs: 57 %
MCH: 30.3 pg (ref 26.6–33.0)
MCHC: 32.7 g/dL (ref 31.5–35.7)
MCV: 92 fL (ref 79–97)
Monocytes Absolute: 0.5 10*3/uL (ref 0.1–0.9)
Monocytes: 12 %
Neutrophils Absolute: 1.2 10*3/uL — ABNORMAL LOW (ref 1.4–7.0)
Neutrophils: 29 %
Platelets: 281 10*3/uL (ref 150–450)
RBC: 4.23 x10E6/uL (ref 3.77–5.28)
RDW: 12.6 % (ref 11.7–15.4)
WBC: 4.1 10*3/uL (ref 3.4–10.8)

## 2018-10-26 LAB — LIPID PANEL
Chol/HDL Ratio: 2.9 ratio (ref 0.0–4.4)
Cholesterol, Total: 149 mg/dL (ref 100–199)
HDL: 51 mg/dL (ref >39)
LDL Chol Calc (NIH): 82 mg/dL (ref 0–99)
Triglycerides: 87 mg/dL (ref 0–149)
VLDL Cholesterol Cal: 16 mg/dL (ref 5–40)

## 2018-11-01 ENCOUNTER — Telehealth (INDEPENDENT_AMBULATORY_CARE_PROVIDER_SITE_OTHER): Payer: Self-pay

## 2018-11-01 NOTE — Telephone Encounter (Signed)
Patient returned call to [facility]. She is aware that labs are normal. Nat Christen, CMA

## 2018-11-01 NOTE — Telephone Encounter (Signed)
-----   Message from Kerin Perna, NP sent at 10/26/2018 12:19 PM EDT ----- I have reviewed all labs and they are normal/unremarkable.

## 2018-11-07 ENCOUNTER — Ambulatory Visit: Payer: Medicare Other | Admitting: Family Medicine

## 2018-11-13 ENCOUNTER — Ambulatory Visit (INDEPENDENT_AMBULATORY_CARE_PROVIDER_SITE_OTHER): Payer: Medicare Other | Admitting: Family Medicine

## 2018-11-13 ENCOUNTER — Encounter: Payer: Self-pay | Admitting: Family Medicine

## 2018-11-13 ENCOUNTER — Other Ambulatory Visit: Payer: Self-pay

## 2018-11-13 DIAGNOSIS — G8929 Other chronic pain: Secondary | ICD-10-CM | POA: Diagnosis not present

## 2018-11-13 DIAGNOSIS — M25561 Pain in right knee: Secondary | ICD-10-CM

## 2018-11-13 DIAGNOSIS — M25562 Pain in left knee: Secondary | ICD-10-CM | POA: Diagnosis not present

## 2018-11-13 DIAGNOSIS — M545 Low back pain, unspecified: Secondary | ICD-10-CM

## 2018-11-13 MED ORDER — DICLOFENAC SODIUM 1 % TD GEL: 4.0000 g | Freq: Four times a day (QID) | TRANSDERMAL | 6 refills | Status: DC | PRN

## 2018-11-13 MED FILL — DICLOFENAC SODIUM 1% GEL: 1 | 12 days supply | Qty: 100 | Fill #0
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
## 2018-12-21 ENCOUNTER — Ambulatory Visit
Admission: RE | Admit: 2018-12-21 | Discharge: 2018-12-21 | Disposition: A | Discharge status: Home / Self Care | Payer: Medicare Other | Source: Ambulatory Visit | Attending: Primary Care | Admitting: Primary Care

## 2018-12-21 ENCOUNTER — Other Ambulatory Visit: Payer: Self-pay

## 2018-12-21 DIAGNOSIS — Z1239 Encounter for other screening for malignant neoplasm of breast: Secondary | ICD-10-CM

## 2018-12-26 ENCOUNTER — Other Ambulatory Visit: Payer: Self-pay

## 2018-12-26 ENCOUNTER — Ambulatory Visit (INDEPENDENT_AMBULATORY_CARE_PROVIDER_SITE_OTHER): Payer: Medicare Other | Admitting: Primary Care

## 2018-12-26 ENCOUNTER — Encounter (INDEPENDENT_AMBULATORY_CARE_PROVIDER_SITE_OTHER): Payer: Self-pay | Admitting: Primary Care

## 2018-12-26 VITALS — BP 122/79 | HR 75 | Temp 97.3°F | Ht 64.0 in | Wt 229.4 lb

## 2018-12-26 DIAGNOSIS — L723 Sebaceous cyst: Secondary | ICD-10-CM

## 2018-12-26 NOTE — Patient Instructions (Signed)
Epidermal Cyst  An epidermal cyst is a small, painless lump under your skin. The cyst contains a grayish-white, bad-smelling substance (keratin). Do not try to pop or open an epidermal cyst yourself. What are the causes?  A blocked hair follicle.  A hair that curls and re-enters the skin instead of growing straight out of the skin.  A blocked pore.  Irritated skin.  An injury to the skin.  Certain conditions that are passed along from parent to child (inherited).  Human papillomavirus (HPV).  Long-term sun damage to the skin. What increases the risk?  Having acne.  Being overweight.  Being 30-40 years old. What are the signs or symptoms? These cysts are usually harmless, but they can get infected. Symptoms of infection may include:  Redness.  Inflammation.  Tenderness.  Warmth.  Fever.  A grayish-white, bad-smelling substance drains from the cyst.  Pus drains from the cyst. How is this treated? In many cases, epidermal cysts go away on their own without treatment. If a cyst becomes infected, treatment may include:  Opening and draining the cyst, done by a doctor. After draining, you may need minor surgery to remove the rest of the cyst.  Antibiotic medicine.  Shots of medicines (steroids) that help to reduce inflammation.  Surgery to remove the cyst. Surgery may be done if the cyst: ? Becomes large. ? Bothers you. ? Has a chance of turning into cancer.  Do not try to open a cyst yourself. Follow these instructions at home:  Take over-the-counter and prescription medicines only as told by your doctor.  If you were prescribed an antibiotic medicine, take it it as told by your doctor. Do not stop using the antibiotic even if you start to feel better.  Keep the area around your cyst clean and dry.  Wear loose, dry clothing.  Avoid touching your cyst.  Check your cyst every day for signs of infection. Check for: ? Redness, swelling, or pain. ? Fluid  or blood. ? Warmth. ? Pus or a bad smell.  Keep all follow-up visits as told by your doctor. This is important. How is this prevented?  Wear clean, dry, clothing.  Avoid wearing tight clothing.  Keep your skin clean and dry. Take showers or baths every day. Contact a doctor if:  Your cyst has symptoms of infection.  Your condition does not improve or gets worse.  You have a cyst that looks different from other cysts you have had.  You have a fever. Get help right away if:  Redness spreads from the cyst into the area close by. Summary  An epidermal cyst is a sac made of skin tissue.  If a cyst becomes infected, treatment may include surgery to open and drain the cyst, or to remove it.  Take over-the-counter and prescription medicines only as told by your doctor.  Contact a doctor if your condition is not improving or is getting worse.  Keep all follow-up visits as told by your doctor. This is important. This information is not intended to replace advice given to you by your health care provider. Make sure you discuss any questions you have with your health care provider. Document Released: 03/11/2004 Document Revised: 05/25/2018 Document Reviewed: 11/10/2017 Elsevier Patient Education  2020 Elsevier Inc.  

## 2018-12-26 NOTE — Progress Notes (Signed)
Acute Office Visit  Subjective:    Patient ID: Brandy Stewart, female    DOB: 1950-11-26, 68 y.o.   MRN: [id number]  Chief Complaint  Patient presents with  . Cyst    under left breast   . Allergies    HPI Patient is in today for an acute visit she is concern with a cyst under her left breast that is painful.   Past Medical History:  Diagnosis Date  . Leukemia [facility])     Past Surgical History:  Procedure Laterality Date  . PARTIAL HYSTERECTOMY      Family History  Problem Relation Age of Onset  . Diabetes Mother   . Hypertension Mother   . Diabetes Father   . Hypertension Father     Social History   Socioeconomic History  . Marital status: Widowed    Spouse name: Not on file  . Number of children: Not on file  . Years of education: Not on file  . Highest education level: Not on file  Occupational History  . Not on file  Social Needs  . Financial resource strain: Not on file  . Food insecurity    Worry: Not on file    Inability: Not on file  . Transportation needs    Medical: Not on file    Non-medical: Not on file  Tobacco Use  . Smoking status: Never Smoker  . Smokeless tobacco: Never Used  Substance and Sexual Activity  . Alcohol use: No  . Drug use: No  . Sexual activity: Yes    Comment: recently with partner of 1 year  Lifestyle  . Physical activity    Days per week: Not on file    Minutes per session: Not on file  . Stress: Not on file  Relationships  . Social Herbalist on phone: Not on file    Gets together: Not on file    Attends religious service: Not on file    Active member of club or organization: Not on file    Attends meetings of clubs or organizations: Not on file    Relationship status: Not on file  . Intimate partner violence    Fear of current or ex partner: Not on file    Emotionally abused: Not on file    Physically abused: Not on file    Forced sexual activity: Not on file  Other Topics Concern  . Not on  file  Social History Narrative  . Not on file    Outpatient Medications Prior to Visit  Medication Sig Dispense Refill  . clindamycin (CLEOCIN) 300 MG capsule Take 300 mg by mouth every 6 (six) hours.    Marland Kitchen HYDROcodone-acetaminophen (NORCO/VICODIN) 5-325 MG tablet     . ibuprofen (ADVIL) 600 MG tablet Take 600 mg by mouth every 8 (eight) hours as needed.    . diclofenac sodium (VOLTAREN) 1 % GEL Apply 4 g topically 4 (four) times daily as needed. (Patient not taking: Reported on 12/26/2018) 500 g 6  . acetaminophen (TYLENOL) 325 MG tablet Take 650 mg by mouth every 6 (six) hours as needed.     No facility-administered medications prior to visit.     Allergies  Allergen Reactions  . Penicillins Rash    Has patient had a PCN reaction causing immediate rash, facial/tongue/throat swelling, SOB or lightheadedness with hypotension: Yes Has patient had a PCN reaction causing severe rash involving mucus membranes or skin necrosis: No Has patient had a PCN  reaction that required hospitalization: No Has patient had a PCN reaction occurring within the last 10 years: No If all of the above answers are "NO", then may proceed with Cephalosporin use.     Review of Systems  Respiratory: Negative for wheezing.   Skin:       Cyst under breast  All other systems reviewed and are negative.      Objective:    Physical Exam  Constitutional: She is oriented to person, place, and time. She appears well-developed and well-nourished.  Neck: Neck supple.  Cardiovascular: Normal rate and regular rhythm.  Pulmonary/Chest: Effort normal and breath sounds normal.  Abdominal: Soft. Bowel sounds are normal.  Musculoskeletal: Normal range of motion.  Neurological: She is oriented to person, place, and time.  Skin:  Cyst under breast  Psychiatric: She has a normal mood and affect. Her behavior is normal.  Vitals reviewed.   BP 122/79 (BP Location: Left Arm, Patient Position: Sitting, Cuff Size: Large)    Pulse 75   Temp (!) 97.3 F (36.3 C) (Temporal)   Ht 5\' 4"  (1.626 m)   Wt 229 lb 6.4 oz (104.1 kg)   SpO2 98%   BMI 39.38 kg/m  Wt Readings from Last 3 Encounters:  12/26/18 229 lb 6.4 oz (104.1 kg)  10/25/18 233 lb (105.7 kg)  09/29/18 200 lb (90.7 kg)    Health Maintenance Due  Topic Date Due  . DEXA SCAN  01/28/2018  . URINE MICROALBUMIN  11/26/2018    There are no preventive care reminders to display for this patient.   No results found for: TSH Lab Results  Component Value Date   WBC 4.1 10/25/2018   HGB 12.8 10/25/2018   HCT 39.1 10/25/2018   MCV 92 10/25/2018   PLT 281 10/25/2018   Lab Results  Component Value Date   NA 139 06/24/2017   K 4.4 06/24/2017   CO2 28 06/24/2017   GLUCOSE 96 06/24/2017   BUN 7 06/24/2017   CREATININE 0.71 06/24/2017   BILITOT 0.4 06/24/2017   ALKPHOS 79 06/24/2017   AST 29 06/24/2017   ALT 17 06/24/2017   PROT 7.8 06/24/2017   ALBUMIN 3.5 06/24/2017   CALCIUM 9.0 06/24/2017   ANIONGAP 8 06/24/2017   Lab Results  Component Value Date   CHOL 149 10/25/2018   Lab Results  Component Value Date   HDL 51 10/25/2018   Lab Results  Component Value Date   LDLCALC 82 10/25/2018   Lab Results  Component Value Date   TRIG 87 10/25/2018   Lab Results  Component Value Date   CHOLHDL 2.9 10/25/2018   Lab Results  Component Value Date   HGBA1C 5.9 (A) 04/12/2018       Assessment & Plan:  Sheneta was seen today for cyst and allergies.  Diagnoses and all orders for this visit:  Sebaceous cyst There is a sebaceous cyst under her breast  painful lump under your skin. The cyst contains a grayish-white, bad-smelling substance (keratin). Do not try to pop or open an epidermal cyst yourself.You may use warm compresses and cyst my open by it self. Discussed signs and symptoms of infection and to call if a smell, red, warmth is present or increase pain    No orders of the defined types were placed in this  encounter.    Kerin Perna, NP

## 2019-01-10 ENCOUNTER — Ambulatory Visit (INDEPENDENT_AMBULATORY_CARE_PROVIDER_SITE_OTHER): Payer: Medicare Other | Admitting: Primary Care

## 2019-01-24 ENCOUNTER — Ambulatory Visit (INDEPENDENT_AMBULATORY_CARE_PROVIDER_SITE_OTHER): Payer: Medicare Other | Admitting: Primary Care

## 2019-01-25 ENCOUNTER — Encounter (INDEPENDENT_AMBULATORY_CARE_PROVIDER_SITE_OTHER): Payer: Self-pay | Admitting: Primary Care

## 2019-01-25 ENCOUNTER — Other Ambulatory Visit: Payer: Self-pay

## 2019-01-25 ENCOUNTER — Ambulatory Visit (INDEPENDENT_AMBULATORY_CARE_PROVIDER_SITE_OTHER): Payer: Medicare Other | Admitting: Primary Care

## 2019-01-25 DIAGNOSIS — G8929 Other chronic pain: Secondary | ICD-10-CM | POA: Diagnosis not present

## 2019-01-25 DIAGNOSIS — M5441 Lumbago with sciatica, right side: Secondary | ICD-10-CM

## 2019-01-25 DIAGNOSIS — M5442 Lumbago with sciatica, left side: Secondary | ICD-10-CM

## 2019-01-25 DIAGNOSIS — I10 Essential (primary) hypertension: Secondary | ICD-10-CM

## 2019-01-25 MED ORDER — BLOOD PRESSURE MONITOR AUTOMAT DEVI: 1.0000 | Freq: Three times a day (TID) | 0 refills | Status: DC

## 2019-01-25 NOTE — Progress Notes (Signed)
Needs BP cuff

## 2019-02-01 ENCOUNTER — Telehealth (INDEPENDENT_AMBULATORY_CARE_PROVIDER_SITE_OTHER): Payer: Self-pay

## 2019-02-01 NOTE — Telephone Encounter (Signed)
Patient called stating PCP was supposed to send a Rx for a cream for her back. Rx was not sent. Please send to [facility]. Nat Christen, CMA

## 2019-02-05 ENCOUNTER — Other Ambulatory Visit (INDEPENDENT_AMBULATORY_CARE_PROVIDER_SITE_OTHER): Payer: Self-pay | Admitting: Primary Care

## 2019-02-05 MED ORDER — IBUPROFEN 600 MG PO TABS: 600.0000 mg | ORAL_TABLET | Freq: Three times a day (TID) | ORAL | 1 refills | Status: DC | PRN

## 2019-02-05 MED FILL — IBUPROFEN 600 MG TABLET: 600 | 30 days supply | Qty: 90 | Fill #0

## 2019-02-10 MED ORDER — DICLOFENAC SODIUM 1 % EX GEL: 4.0000 g | Freq: Four times a day (QID) | CUTANEOUS | 5 refills | Status: DC

## 2019-02-10 NOTE — Progress Notes (Signed)
Virtual Visit via Telephone Note  I connected with Brandy Stewart on 02/10/19 at 10:10 AM EST by telephone and verified that I am speaking with the correct person using two identifiers.   I discussed the limitations, risks, security and privacy concerns of performing an evaluation and management service by telephone and the availability of in person appointments. I also discussed with the patient that there may be a patient responsible charge related to this service. The patient expressed understanding and agreed to proceed.   History of Present Illness: Brandy Stewart is supposed to be having a Blood pressure tele visit to determine if  blood pressure medication is needed.  She doe not have a Bp monitor . Informed her due to [place] most insurances are paying for a BP monitor . Printed out a prescription for her pick up and take to a supply store (medical) She denies shortness of breath, headaches, chest pain or lower extremity edema Past Medical History:  Diagnosis Date  . Leukemia [facility])    Current Outpatient Medications on File Prior to Visit  Medication Sig Dispense Refill  . diclofenac sodium (VOLTAREN) 1 % GEL Apply 4 g topically 4 (four) times daily as needed. (Patient not taking: Reported on 12/26/2018) 500 g 6   No current facility-administered medications on file prior to visit.   Observations/Objective: Review of Systems  Musculoskeletal: Positive for back pain.  All other systems reviewed and are negative.   Assessment and Plan: Brandy Stewart was seen today for hypertension and back pain.  Diagnoses and all orders for this visit:  Brandy Stewart was seen today for hypertension and back pain.  Diagnoses and all orders for this visit:  Chronic midline low back pain with bilateral sciatica Pain is variable can interfere with her ADL's . Currently managed with topical NSAID's. Followed by Dr. [facility] orth[facility]      diclofenac Sodium (VOLTAREN) 1 % GEL; Apply 4 g topically 4 (four) times  daily.  Hypertension, unspecified type Liable Bp goal 130/80 take at home diet modification reduction in sodium intake and excercising is two food weight reduction improve Bp and may improve back pain  Other orders -     Blood Pressure Monitoring (BLOOD PRESSURE MONITOR AUTOMAT) DEVI; 1 kit by Does not apply route 3 (three) times daily. -   Chronic midline low back pain with bilateral sciatica -     diclofenac Sodium (VOLTAREN) 1 % GEL; Apply 4 g topically 4 (four) times daily. Other orders -     Blood Pressure Monitoring (BLOOD PRESSURE MONITOR AUTOMAT) DEVI; 1 kit by Does not apply route 3 (three) times daily.     Follow Up Instructions:    I discussed the assessment and treatment plan with the patient. The patient was provided an opportunity to ask questions and all were answered. The patient agreed with the plan and demonstrated an understanding of the instructions.   The patient was advised to call back or seek an in-person evaluation if the symptoms worsen or if the condition fails to improve as anticipated.  I provided 14 minutes of non-face-to-face time during this encounter.   Kerin Perna, NP

## 2019-02-12 MED FILL — DICLOFENAC SODIUM 1% GEL: 1 | 6 days supply | Qty: 100 | Fill #0

## 2019-02-21 MED FILL — DICLOFENAC SODIUM 1% GEL: 1 | 6 days supply | Qty: 100 | Fill #0

## 2019-03-01 ENCOUNTER — Encounter (INDEPENDENT_AMBULATORY_CARE_PROVIDER_SITE_OTHER): Payer: Self-pay | Admitting: Primary Care

## 2019-03-01 ENCOUNTER — Ambulatory Visit (INDEPENDENT_AMBULATORY_CARE_PROVIDER_SITE_OTHER): Payer: Medicare Other | Admitting: Primary Care

## 2019-03-01 ENCOUNTER — Other Ambulatory Visit: Payer: Self-pay

## 2019-03-01 DIAGNOSIS — M2550 Pain in unspecified joint: Secondary | ICD-10-CM | POA: Diagnosis not present

## 2019-03-01 DIAGNOSIS — R030 Elevated blood-pressure reading, without diagnosis of hypertension: Secondary | ICD-10-CM

## 2019-03-01 MED ORDER — BLOOD GLUCOSE MONITOR SYSTEM W/DEVICE KIT: 1.0000 | PACK | Freq: Three times a day (TID) | 0 refills | Status: DC | PRN
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
## 2019-03-01 NOTE — Progress Notes (Signed)
Virtual Visit via Telephone Note  I connected with Brandy Stewart on 03/01/19 at  8:50 AM EST by telephone and verified that I am speaking with the correct person using two identifiers.   I discussed the limitations, risks, security and privacy concerns of performing an evaluation and management service by telephone and the availability of in person appointments. I also discussed with the patient that there may be a patient responsible charge related to this service. The patient expressed understanding and agreed to proceed.   History of Present Illness: Brandy Stewart is having a tele to discuss medication that help with pain she hurts all over her friend is taking black seed and it is helping her and would like to know can she take it. Advise yes or glucosamine but speak with pharmacist about any interactions with her medications. Past Medical History:  Diagnosis Date  . Leukemia [facility])       Current Outpatient Medications on File Prior to Visit  Medication Sig Dispense Refill  . diclofenac Sodium (VOLTAREN) 1 % GEL Apply 4 g topically 4 (four) times daily. 150 g 5  . ibuprofen (ADVIL) 600 MG tablet Take 1 tablet (600 mg total) by mouth every 8 (eight) hours as needed. 90 tablet 1  . Blood Pressure Monitoring (BLOOD PRESSURE MONITOR AUTOMAT) DEVI 1 kit by Does not apply route 3 (three) times daily. 1 each 0   No current facility-administered medications on file prior to visit.   Observations/Objective: Review of Systems  Gastrointestinal: Positive for abdominal pain and heartburn.       Only when drinking milk  Musculoskeletal: Positive for back pain and joint pain.  All other systems reviewed and are negative.   Assessment and Plan: Brandy Stewart was seen today for pain.  Diagnoses and all orders for this visit:  Elevated blood pressure reading in office without diagnosis of hypertension Unable to check blood pressure prescription printed for pick up to take to medical supply store  or [place]. Explain most insurance have been paying for Bp monitor since pandemic. Follow up when monitor is brought and check Bp for 2 weeks and make an appointment for tele review  Arthralgia, unspecified joint If no contraindications for black seed oil or glucosamine. May alternate with heat and ice application for pain relief. May also alternate with acetaminophen and Ibuprofen as prescribed pain relief. Other alternatives include massage, acupuncture and water aerobics.  You must stay active and avoid a sedentary lifestyle.  Other orders -     Blood Glucose Monitoring Suppl (BLOOD GLUCOSE MONITOR SYSTEM) w/Device KIT; 1 Bag by Does not apply route 3 (three) times daily as needed.    Follow Up Instructions:    I discussed the assessment and treatment plan with the patient. The patient was provided an opportunity to ask questions and all were answered. The patient agreed with the plan and demonstrated an understanding of the instructions.   The patient was advised to call back or seek an in-person evaluation if the symptoms worsen or if the condition fails to improve as anticipated.  I provided 13 minutes of non-face-to-face time during this encounter.   Kerin Perna, NP

## 2019-05-01 ENCOUNTER — Ambulatory Visit (INDEPENDENT_AMBULATORY_CARE_PROVIDER_SITE_OTHER): Payer: Medicare Other | Admitting: Primary Care

## 2019-05-01 ENCOUNTER — Encounter (INDEPENDENT_AMBULATORY_CARE_PROVIDER_SITE_OTHER): Payer: Self-pay | Admitting: Primary Care

## 2019-05-01 ENCOUNTER — Other Ambulatory Visit: Payer: Self-pay

## 2019-05-01 VITALS — BP 126/77 | HR 84 | Temp 97.2°F | Ht 64.0 in | Wt 233.6 lb

## 2019-05-01 DIAGNOSIS — J302 Other seasonal allergic rhinitis: Secondary | ICD-10-CM

## 2019-05-01 DIAGNOSIS — R7303 Prediabetes: Secondary | ICD-10-CM | POA: Diagnosis not present

## 2019-05-01 DIAGNOSIS — Z780 Asymptomatic menopausal state: Secondary | ICD-10-CM

## 2019-05-01 DIAGNOSIS — M5441 Lumbago with sciatica, right side: Secondary | ICD-10-CM | POA: Diagnosis not present

## 2019-05-01 DIAGNOSIS — G8929 Other chronic pain: Secondary | ICD-10-CM

## 2019-05-01 DIAGNOSIS — M5442 Lumbago with sciatica, left side: Secondary | ICD-10-CM

## 2019-05-01 LAB — POCT GLYCOSYLATED HEMOGLOBIN (HGB A1C): Hemoglobin A1C: 6.1 % — AB (ref 4.0–5.6)

## 2019-05-01 MED ORDER — CETIRIZINE HCL 10 MG PO TABS: 10.0000 mg | ORAL_TABLET | Freq: Every day | ORAL | 11 refills | Status: DC
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
## 2019-05-01 NOTE — Patient Instructions (Addendum)
Bone Density Test The bone density test uses a special type of X-ray to measure the amount of calcium and other minerals in your bones. It can measure bone density in the hip and the spine. The test procedure is similar to having a regular X-ray. This test may also be called:  Bone densitometry.  Bone mineral density test.  Dual-energy X-ray absorptiometry (DEXA). You may have this test to:  Diagnose a condition that causes weak or thin bones (osteoporosis).  Screen you for osteoporosis.  Predict your risk for a broken bone (fracture).  Determine how well your osteoporosis treatment is working. Tell a health care provider about:  Any allergies you have.  All medicines you are taking, including vitamins, herbs, eye drops, creams, and over-the-counter medicines.  Any problems you or family members have had with anesthetic medicines.  Any blood disorders you have.  Any surgeries you have had.  Any medical conditions you have.  Whether you are pregnant or may be pregnant.  Any medical tests you have had within the past 14 days that used contrast material. What are the risks? Generally, this is a safe procedure. However, it does expose you to a small amount of radiation, which can slightly increase your cancer risk. What happens before the procedure?  Do not take any calcium supplements starting 24 hours before your test.  Remove all metal jewelry, eyeglasses, dental appliances, and any other metal objects. What happens during the procedure?   You will lie down on an exam table. There will be an X-ray generator below you and an imaging device above you.  Other devices, such as boxes or braces, may be used to position your body properly for the scan.  The machine will slowly scan your body. You will need to keep still.  The images will show up on a screen in the room. Images will be examined by a specialist after your test is done. The procedure may vary among health care  providers and hospitals. What happens after the procedure?  It is up to you to get your test results. Ask your health care provider, or the department that is doing the test, when your results will be ready. Summary  A bone density test is an imaging test that uses a type of X-ray to measure the amount of calcium and other minerals in your bones.  The test may be used to diagnose or screen you for a condition that causes weak or thin bones (osteoporosis), predict your risk for a broken bone (fracture), or determine how well your osteoporosis treatment is working.  Do not take any calcium supplements starting 24 hours before your test.  Ask your health care provider, or the department that is doing the test, when your results will be ready. This information is not intended to replace advice given to you by your health care provider. Make sure you discuss any questions you have with your health care provider. Document Revised: 02/17/2017 Document Reviewed: 12/06/2016 Elsevier Patient Education  [place]. 8 Easy Exercises You Can Do Sitting Down  Got a chair? Then you're ready for this sit-down, total-body workout!.  Safety precaution: Pay attention to your body during the movements -- if anything hurts or causes pain, stop immediately. And check with your doctor first before beginning this, or any, exercise program.   Sunshine Arm Circles Seated in a chair with good posture, hold a ball in both hands with arms extended above your head and/or in front of you,  keeping elbows slightly bent. Visualizing the face of a clock out in front of you, begin by holding arms up overhead at 12 o'clock. Circle the ball around to go all the way around the clock in a controlled, fluid motion. When you've reached 12 o'clock again, reverse directions and circle the opposite way. Keep alternating circle directions for 8 repetitions. Rest. Do another set of 8 repetitions.  Modification: A ball is  not required for this exercise. Imagine that you are holding a ball while performing the motion. If it is difficult to bring your arms overhead, extend them out in front of you and move arms as if drawing a circle on the wall with or without the ball.   Tummy Twists Seated in a chair with good posture, hold a ball with both hands close to the body, with elbows bent and pulled in close to the ribcage. Slowly rotate your torso to the right as far as you comfortably can, being sure to keep the rest of your body still and stable. Rotate back to the center and repeat in the opposite direction. Do this 8 times, with two twists counting as a full set. Rest. Do another 8 sets (two twists each). Modification: A ball is not required for this exercise. Imagine you are holding a ball while performing the motion, or hold a small object such as a can of soup or water bottle to add resistance   Ball Chest Press Seated in a chair with good posture, hold a ball with both hands at chest level, palms facing toward each other and elbows bent. Avoid bending forward by keeping your shoulders back at all times. Squeeze the ball slightly as you push the ball away from you in a fluid motion, taking about 2 seconds to extend the arms. Squeeze your shoulder blades together as you pull the ball back toward your chest. Repeat the push and pull motion 10 to 15 times. Rest. Do another set of 10 to 15 repetitions.  Modification: For a greater challenge, add a Tai Chi feel by standing with one leg slightly in front of the other (with a chair nearby if needed for extra balance) and slowly rocking the entire body forward and back as you push the ball away and pull back in.     Front Arm Raises In a seated position with good posture, hold a ball in both hands with palms facing each other. Extend the arms out in front of your body, keeping your elbows slightly bent. Starting with the ball lowered toward the knees,  slowly raise your arms to lift the ball up to shoulder level (no higher), then lower the ball back to the starting position, taking about 2 to 3 seconds to lift and lower. Repeat 10 to 15 times. Rest. Do another set of 10 to 15 repetitions. Modification: A ball is not required for this exercise. Imagine you are holding a ball as you perform the motion, or hold a small object, such as a can of soup or water bottle for added resistance.        Inner Thigh Squeezes Sitting toward the edge of a chair with good posture and knees bent, place a ball in between your knees; press the knees together to squeeze the ball, taking about 1 to 2 seconds to squeeze. You should feel the resistance in your inner thighs. Slowly release, keeping slight tension on the ball so that it does not fall. Repeat 8 to 10 times. Rest. Do  another set of 8 to 10 repetitions. Modification: For a greater challenge, change the count of the squeezes by squeezing the ball and holding for 5 seconds, then releasing again. Or, do short, quick pulsing squeezes.     Knee Extensions Sitting toward the edge of a chair with good posture and bent knees, hold on to the sides of the chair with your hands. Extend the right knee out so that the toes come up toward the ceiling, being sure to keep the knee slightly bent without locking it through the entire movement. Lower the leg back to a bent position and repeat this movement 8 to 10 times, using about 2 seconds each to lift and lower the leg. Switch to the opposite leg and perform 8 to 10 repetitions. Rest briefly. Do another set of 8 to 10 repetitions for each leg. Modification: If you are more advanced, sitting in the same position as above, extend one leg out in front of you with toes pointed to the ceiling. Lift and lower the entire leg only as high as you comfortably can, keeping the knee slightly bent. The longer lever adds difficulty to the exercise.   Elbow to  Knee Seated toward the edge of a chair with good posture and knees bent, start with your right arm extended up overhead. Slowly lift the left knee up as you lower your right elbow down toward your left knee, taking about 2 seconds to lower down. Try not to bend over at the waist. Release and go back to the starting position. Repeat 8 to 10 times. Switch sides and do 8 to 10 repetitions, pulling one elbow to the opposite knee. Rest. Do another set of 8 to 10 repetitions on each side. Modification: Try this (with a chair nearby for balance) exercise in a standing position for an increased range of motion.     Overhead Arm Extensions Seated in a chair with good posture, hold a ball with both hands and raise it up over your head, with arms extended without locking the elbows. Keeping the elbows pulled in toward the head, slowly bend the elbows to lower the ball down along the back of the neck, using about 2 seconds to go down, then 2 seconds to push the ball back up over your head. Repeat 8 to 10 times. Rest. Do another set of 8 to 10 repetitions. Modification: Try seated tricep extensions (ball not required for this modification). Bending slightly forward with elbows tucked into your sides, slowly extend the elbows so that your forearms go back behind you, keeping the elbows pulled up and in for the entire movement. Return to the starting position and repeat. Hold soup cans or small weights for added resistance.

## 2019-05-02 ENCOUNTER — Other Ambulatory Visit (INDEPENDENT_AMBULATORY_CARE_PROVIDER_SITE_OTHER): Payer: Medicare Other

## 2019-05-02 DIAGNOSIS — R030 Elevated blood-pressure reading, without diagnosis of hypertension: Secondary | ICD-10-CM

## 2019-05-03 LAB — CMP14+EGFR
ALT: 14 IU/L (ref 0–32)
AST: 33 IU/L (ref 0–40)
Albumin/Globulin Ratio: 1 — ABNORMAL LOW (ref 1.2–2.2)
Albumin: 4.2 g/dL (ref 3.8–4.8)
Alkaline Phosphatase: 91 IU/L (ref 39–117)
BUN/Creatinine Ratio: 13 (ref 12–28)
BUN: 11 mg/dL (ref 8–27)
Bilirubin Total: <0.2 mg/dL (ref 0.0–1.2)
CO2: 23 mmol/L (ref 20–29)
Calcium: 9.1 mg/dL (ref 8.7–10.3)
Chloride: 99 mmol/L (ref 96–106)
Creatinine, Ser: 0.83 mg/dL (ref 0.57–1.00)
GFR calc Af Amer: 85 mL/min/{1.73_m2} (ref >59)
GFR calc non Af Amer: 74 mL/min/{1.73_m2} (ref >59)
Globulin, Total: 4.1 g/dL (ref 1.5–4.5)
Glucose: 102 mg/dL — ABNORMAL HIGH (ref 65–99)
Potassium: 4.2 mmol/L (ref 3.5–5.2)
Sodium: 135 mmol/L (ref 134–144)
Total Protein: 8.3 g/dL (ref 6.0–8.5)

## 2019-05-03 LAB — MICROALBUMIN, URINE: Microalbumin, Urine: 133.2 ug/mL

## 2019-05-05 ENCOUNTER — Ambulatory Visit (HOSPITAL_COMMUNITY)
Admission: EM | Admit: 2019-05-05 | Discharge: 2019-05-05 | Disposition: A | Discharge status: Home / Self Care | Payer: Medicare Other | Attending: Family Medicine | Admitting: Family Medicine

## 2019-05-05 ENCOUNTER — Other Ambulatory Visit: Payer: Self-pay

## 2019-05-05 ENCOUNTER — Ambulatory Visit (INDEPENDENT_AMBULATORY_CARE_PROVIDER_SITE_OTHER): Payer: Medicare Other

## 2019-05-05 ENCOUNTER — Encounter (HOSPITAL_COMMUNITY): Payer: Self-pay | Admitting: Emergency Medicine

## 2019-05-05 DIAGNOSIS — M5441 Lumbago with sciatica, right side: Secondary | ICD-10-CM | POA: Diagnosis not present

## 2019-05-05 DIAGNOSIS — M545 Low back pain: Secondary | ICD-10-CM

## 2019-05-05 LAB — POCT URINALYSIS DIP (DEVICE)
Glucose, UA: NEGATIVE mg/dL
Hgb urine dipstick: NEGATIVE
Ketones, ur: 15 mg/dL — AB
Leukocytes,Ua: NEGATIVE
Nitrite: NEGATIVE
Protein, ur: >=300 mg/dL — AB
Specific Gravity, Urine: >=1.03 (ref 1.005–1.030)
Urobilinogen, UA: 0.2 mg/dL (ref 0.0–1.0)
pH: 6 (ref 5.0–8.0)

## 2019-05-05 MED ORDER — PREDNISONE 10 MG (21) PO TBPK: ORAL_TABLET | ORAL | 0 refills | Status: DC

## 2019-05-05 MED ORDER — ONDANSETRON 4 MG PO TBDP
ORAL_TABLET | ORAL | Status: AC
  Filled 2019-05-05: qty 1

## 2019-05-05 MED ORDER — KETOROLAC TROMETHAMINE 30 MG/ML IJ SOLN
INTRAMUSCULAR | Status: AC
  Filled 2019-05-05: qty 1

## 2019-05-05 MED ORDER — KETOROLAC TROMETHAMINE 30 MG/ML IJ SOLN
30.0000 mg | Freq: Once | INTRAMUSCULAR | Status: AC
  Administered 2019-05-05: 30 mg via INTRAMUSCULAR

## 2019-05-05 MED ORDER — HYDROCODONE-ACETAMINOPHEN 5-325 MG PO TABS: 2.0000 | ORAL_TABLET | Freq: Four times a day (QID) | ORAL | 0 refills | Status: AC | PRN

## 2019-05-05 MED ORDER — ONDANSETRON 4 MG PO TBDP
4.0000 mg | ORAL_TABLET | Freq: Once | ORAL | Status: AC
  Administered 2019-05-05: 4 mg via ORAL

## 2019-05-05 NOTE — Discharge Instructions (Addendum)
Your x-ray did not show anything worrisome or acute. This is most likely sciatic nerve pain. Toradol given here for pain Sending you home with prednisone taper and hydrocodone for severe pain as needed Follow-up with your doctor as needed

## 2019-05-05 NOTE — ED Provider Notes (Signed)
[place]    CSN: [id number] Arrival date & time: 05/05/19  1337      History   Chief Complaint Chief Complaint  Patient presents with  . Back Pain  . Weakness  . Emesis    HPI Brandy Stewart is a 69 y.o. female.   Patient is a 69 year old female past medical history of leukemia, dysuria, osteopenia, chronic back pain with sciatica, Degenerative disc disease, essential hypertension, fatty liver, ITP.  She presents today with lower back discomfort.  This is been constant and worsening over the past 2 days.  Reporting radiation of pain down both legs and mild numbness and tingling at times.  Denies any loss of bowel or bladder function.  She has had some associated dysuria.  No frequency of urination or hematuria.  No fevers, chills, body aches or night sweats.  She has had mild nausea, vomiting and generalized weakness.   ROS per HPI       Past Medical History:  Diagnosis Date  . Leukemia [facility])     Patient Active Problem List   Diagnosis Date Noted  . Dysuria 04/12/2018  . Infected dental carries 04/12/2018  . Osteopenia after menopause 04/12/2018  . Breast cancer screening 04/12/2018  . Chronic midline low back pain with bilateral sciatica 04/28/2015  . DDD (degenerative disc disease), lumbosacral 04/08/2015  . Essential hypertension 04/08/2015  . Fatty liver 04/08/2015  . 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 Impaired fasting glucose 04/08/2015  . Lumbosacral spondylosis 04/08/2015  . Sciatica 04/08/2015  . Thrombocytopenia ([place]) 04/08/2015  . Vitamin D deficiency 04/08/2015  . Neutropenia ([place]) 11/15/2014    Past Surgical History:  Procedure Laterality Date  . PARTIAL HYSTERECTOMY      OB History   No obstetric history on file.      Home Medications    Prior to Admission medications   Medication Sig Start Date End Date Taking? Authorizing Provider  Blood Glucose Monitoring Suppl (BLOOD GLUCOSE MONITOR SYSTEM) w/Device KIT 1 Bag by Does not  apply route 3 (three) times daily as needed. Patient not taking: Reported on 05/01/2019 03/01/19   Kerin Perna, NP  Blood Pressure Monitoring (BLOOD PRESSURE MONITOR AUTOMAT) DEVI 1 kit by Does not apply route 3 (three) times daily. 01/25/19   Kerin Perna, NP  cetirizine (ZYRTEC) 10 MG tablet Take 1 tablet (10 mg total) by mouth daily. 05/01/19   Kerin Perna, NP  diclofenac Sodium (VOLTAREN) 1 % GEL Apply 4 g topically 4 (four) times daily. 02/10/19   Kerin Perna, NP  HYDROcodone-acetaminophen (NORCO/VICODIN) 5-325 MG tablet Take 2 tablets by mouth every 6 (six) hours as needed for up to 3 days. 05/05/19 05/08/19  Loura Halt A, NP  ibuprofen (ADVIL) 600 MG tablet Take 1 tablet (600 mg total) by mouth every 8 (eight) hours as needed. 02/05/19   Kerin Perna, NP  predniSONE (STERAPRED UNI-PAK 21 TAB) 10 MG (21) TBPK tablet 6 tabs for 1 day, then 5 tabs for 1 das, then 4 tabs for 1 day, then 3 tabs for 1 day, 2 tabs for 1 day, then 1 tab for 1 day 05/05/19   Orvan July, NP    Family History Family History  Problem Relation Age of Onset  . Diabetes Mother   . Hypertension Mother   . Diabetes Father   . Hypertension Father     Social History Social History   Tobacco Use  . Smoking status: Never Smoker  .  Smokeless tobacco: Never Used  Substance Use Topics  . Alcohol use: No  . Drug use: No     Allergies   Penicillins   Review of Systems Review of Systems   Physical Exam Triage Vital Signs ED Triage Vitals  Enc Vitals Group     BP 05/05/19 1420 138/83     Pulse Rate 05/05/19 1420 94     Resp 05/05/19 1420 18     Temp 05/05/19 1420 99.6 F (37.6 C)     Temp Source 05/05/19 1420 Oral     SpO2 05/05/19 1420 95 %     Weight --      Height --      Head Circumference --      Peak Flow --      Pain Score 05/05/19 1421 10     Pain Loc --      Pain Edu? --      Excl. in [place]? --    No data found.  Updated Vital Signs BP 138/83 (BP  Location: Right Arm)   Pulse 94   Temp 99.6 F (37.6 C) (Oral)   Resp 18   SpO2 95%   Visual Acuity Right Eye Distance:   Left Eye Distance:   Bilateral Distance:    Right Eye Near:   Left Eye Near:    Bilateral Near:     Physical Exam Vitals and nursing note reviewed.  Constitutional:      General: She is not in acute distress.    Appearance: Normal appearance. She is not ill-appearing, toxic-appearing or diaphoretic.     Comments: Appears in pain   HENT:     Head: Normocephalic and atraumatic.     Nose: Nose normal.     Mouth/Throat:     Pharynx: Oropharynx is clear.  Eyes:     Conjunctiva/sclera: Conjunctivae normal.  Pulmonary:     Effort: Pulmonary effort is normal.  Abdominal:     Palpations: Abdomen is soft.     Tenderness: There is no abdominal tenderness.  Musculoskeletal:     Cervical back: Normal range of motion.     Lumbar back: Bony tenderness present. No swelling. Decreased range of motion.       Back:  Skin:    General: Skin is warm and dry.     Findings: No rash.  Neurological:     Mental Status: She is alert.  Psychiatric:        Mood and Affect: Mood normal.      UC Treatments / Results  Labs (all labs ordered are listed, but only abnormal results are displayed) Labs Reviewed  POCT URINALYSIS DIP (DEVICE) - Abnormal; Notable for the following components:      Result Value   Bilirubin Urine SMALL (*)    Ketones, ur 15 (*)    Protein, ur >=300 (*)    All other components within normal limits    EKG   Radiology DG Lumbar Spine Complete  Result Date: 05/05/2019 CLINICAL DATA:  LOWER back pain radiating into legs. EXAM: LUMBAR SPINE - COMPLETE 4+ VIEW COMPARISON:  04/14/2015 and 05/05/2015 FINDINGS: Degenerative changes are present in the LOWER lumbar spine with facet hypertrophy primarily at L3-4, L4-5, L5-S1. There is 5 millimeters anterolisthesis of L4 on L5. No acute fracture. No lytic or blastic lesions. Visualized bowel gas  pattern is nonobstructed. IMPRESSION: 1. Grade 1 anterolisthesis of L4 on L5. 2. No evidence for acute abnormality. Electronically Signed   By: Benjamine Mola  [place] Shark M.D.   On: 05/05/2019 15:04    Procedures Procedures (including critical care time)  Medications Ordered in UC Medications  ketorolac (TORADOL) 30 MG/ML injection 30 mg (30 mg Intramuscular Given 05/05/19 1545)  ondansetron (ZOFRAN-ODT) disintegrating tablet 4 mg (4 mg Oral Given 05/05/19 1545)    Initial Impression / Assessment and Plan / UC Course  I have reviewed the triage vital signs and the nursing notes.  Pertinent labs & imaging results that were available during my care of the patient were reviewed by me and considered in my medical decision making (see chart for details).     Low back pain with right-sided sciatica.-Patient with history of similar X-ray did not show any acute abnormalities. Treating with Toradol injection here in clinic and will send home with prednisone taper and hydrocodone to take over the next few days for severe pain. Urine without any signs of infection Follow up as needed for continued or worsening symptoms  Final Clinical Impressions(s) / UC Diagnoses   Final diagnoses:  Acute midline low back pain with right-sided sciatica     Discharge Instructions     Your x-ray did not show anything worrisome or acute. This is most likely sciatic nerve pain. Toradol given here for pain Sending you home with prednisone taper and hydrocodone for severe pain as needed Follow-up with your doctor as needed    ED Prescriptions    Medication Sig Dispense Auth. Provider   predniSONE (STERAPRED UNI-PAK 21 TAB) 10 MG (21) TBPK tablet 6 tabs for 1 day, then 5 tabs for 1 das, then 4 tabs for 1 day, then 3 tabs for 1 day, 2 tabs for 1 day, then 1 tab for 1 day 21 tablet Bast, Traci A, NP   HYDROcodone-acetaminophen (NORCO/VICODIN) 5-325 MG tablet Take 2 tablets by mouth every 6 (six) hours as needed for up  to 3 days. 12 tablet Bast, Traci A, NP     I have reviewed the PDMP during this encounter.   Orvan July, NP 05/05/19 1554

## 2019-05-05 NOTE — ED Triage Notes (Signed)
Pt here for lower back pain with radiation to legs with some generalized weakness and vomiting; pt also sts some dysuria

## 2019-05-07 ENCOUNTER — Other Ambulatory Visit: Payer: Self-pay

## 2019-05-07 ENCOUNTER — Emergency Department (HOSPITAL_COMMUNITY)
Admission: EM | Admit: 2019-05-07 | Discharge: 2019-05-07 | Disposition: A | Discharge status: Home / Self Care | Payer: Medicare Other | Attending: Emergency Medicine | Admitting: Emergency Medicine

## 2019-05-07 ENCOUNTER — Encounter (HOSPITAL_COMMUNITY): Payer: Self-pay | Admitting: Emergency Medicine

## 2019-05-07 DIAGNOSIS — M5489 Other dorsalgia: Secondary | ICD-10-CM | POA: Diagnosis present

## 2019-05-07 DIAGNOSIS — R112 Nausea with vomiting, unspecified: Secondary | ICD-10-CM | POA: Diagnosis not present

## 2019-05-07 DIAGNOSIS — Z79899 Other long term (current) drug therapy: Secondary | ICD-10-CM | POA: Diagnosis not present

## 2019-05-07 DIAGNOSIS — G8929 Other chronic pain: Secondary | ICD-10-CM

## 2019-05-07 DIAGNOSIS — M545 Low back pain, unspecified: Secondary | ICD-10-CM

## 2019-05-07 LAB — URINALYSIS, ROUTINE W REFLEX MICROSCOPIC
Bilirubin Urine: NEGATIVE
Glucose, UA: NEGATIVE mg/dL
Hgb urine dipstick: NEGATIVE
Ketones, ur: 5 mg/dL — AB
Nitrite: NEGATIVE
Protein, ur: >=300 mg/dL — AB
Specific Gravity, Urine: 1.026 (ref 1.005–1.030)
pH: 6 (ref 5.0–8.0)

## 2019-05-07 LAB — COMPREHENSIVE METABOLIC PANEL
ALT: 30 U/L (ref 0–44)
AST: 70 U/L — ABNORMAL HIGH (ref 15–41)
Albumin: 3 g/dL — ABNORMAL LOW (ref 3.5–5.0)
Alkaline Phosphatase: 55 U/L (ref 38–126)
Anion gap: 14 (ref 5–15)
BUN: 13 mg/dL (ref 8–23)
CO2: 21 mmol/L — ABNORMAL LOW (ref 22–32)
Calcium: 8.2 mg/dL — ABNORMAL LOW (ref 8.9–10.3)
Chloride: 99 mmol/L (ref 98–111)
Creatinine, Ser: 0.8 mg/dL (ref 0.44–1.00)
GFR calc Af Amer: >60 mL/min (ref >60)
GFR calc non Af Amer: >60 mL/min (ref >60)
Glucose, Bld: 135 mg/dL — ABNORMAL HIGH (ref 70–99)
Potassium: 5.3 mmol/L — ABNORMAL HIGH (ref 3.5–5.1)
Sodium: 134 mmol/L — ABNORMAL LOW (ref 135–145)
Total Bilirubin: 2.6 mg/dL — ABNORMAL HIGH (ref 0.3–1.2)

## 2019-05-07 LAB — CBC
HCT: 42.5 % (ref 36.0–46.0)
Hemoglobin: 13.5 g/dL (ref 12.0–15.0)
MCH: 29 pg (ref 26.0–34.0)
MCHC: 31.8 g/dL (ref 30.0–36.0)
MCV: 91.4 fL (ref 80.0–100.0)
Platelets: 158 10*3/uL (ref 150–400)
RBC: 4.65 MIL/uL (ref 3.87–5.11)
RDW: 12.7 % (ref 11.5–15.5)
WBC: 5.8 10*3/uL (ref 4.0–10.5)
nRBC: 0 % (ref 0.0–0.2)

## 2019-05-07 LAB — LIPASE, BLOOD: Lipase: 41 U/L (ref 11–51)

## 2019-05-07 LAB — HEPATIC FUNCTION PANEL
ALT: 22 U/L (ref 0–44)
AST: 47 U/L — ABNORMAL HIGH (ref 15–41)
Albumin: 3.1 g/dL — ABNORMAL LOW (ref 3.5–5.0)
Alkaline Phosphatase: 62 U/L (ref 38–126)
Bilirubin, Direct: 0.1 mg/dL (ref 0.0–0.2)
Indirect Bilirubin: 0.5 mg/dL (ref 0.3–0.9)
Total Bilirubin: 0.6 mg/dL (ref 0.3–1.2)
Total Protein: 8.2 g/dL — ABNORMAL HIGH (ref 6.5–8.1)

## 2019-05-07 LAB — TROPONIN I (HIGH SENSITIVITY): Troponin I (High Sensitivity): 7 ng/L (ref <18)

## 2019-05-07 MED ORDER — ONDANSETRON HCL 4 MG/2ML IJ SOLN
4.0000 mg | Freq: Once | INTRAMUSCULAR | Status: AC
  Administered 2019-05-07: 4 mg via INTRAVENOUS
  Filled 2019-05-07: qty 2

## 2019-05-07 MED ORDER — SODIUM CHLORIDE 0.9% FLUSH
3.0000 mL | Freq: Once | INTRAVENOUS | Status: AC
  Administered 2019-05-07: 3 mL via INTRAVENOUS

## 2019-05-07 MED ORDER — ALUM & MAG HYDROXIDE-SIMETH 200-200-20 MG/5ML PO SUSP
30.0000 mL | Freq: Once | ORAL | Status: AC
  Administered 2019-05-07: 30 mL via ORAL
  Filled 2019-05-07: qty 30

## 2019-05-07 MED ORDER — METHOCARBAMOL 1000 MG/10ML IJ SOLN
1000.0000 mg | Freq: Once | INTRAVENOUS | Status: AC
  Administered 2019-05-07: 1000 mg via INTRAVENOUS
  Filled 2019-05-07: qty 10

## 2019-05-07 MED ORDER — KETOROLAC TROMETHAMINE 30 MG/ML IJ SOLN
30.0000 mg | Freq: Once | INTRAMUSCULAR | Status: AC
  Administered 2019-05-07: 30 mg via INTRAVENOUS
  Filled 2019-05-07: qty 1

## 2019-05-07 MED ORDER — ONDANSETRON HCL 4 MG PO TABS: 4.0000 mg | ORAL_TABLET | Freq: Four times a day (QID) | ORAL | 0 refills | Status: DC | PRN

## 2019-05-07 MED ORDER — LIDOCAINE VISCOUS HCL 2 % MT SOLN
15.0000 mL | Freq: Once | OROMUCOSAL | Status: AC
  Administered 2019-05-07: 15 mL via ORAL
  Filled 2019-05-07: qty 15

## 2019-05-07 MED ORDER — TRAMADOL HCL 50 MG PO TABS: 50.0000 mg | ORAL_TABLET | Freq: Four times a day (QID) | ORAL | 0 refills | Status: DC | PRN

## 2019-05-07 MED ORDER — ONDANSETRON 4 MG PO TBDP
4.0000 mg | ORAL_TABLET | Freq: Once | ORAL | Status: AC | PRN
  Administered 2019-05-07: 4 mg via ORAL
  Filled 2019-05-07: qty 1

## 2019-05-07 MED ORDER — SODIUM CHLORIDE 0.9 % IV BOLUS
1000.0000 mL | Freq: Once | INTRAVENOUS | Status: AC
  Administered 2019-05-07: 1000 mL via INTRAVENOUS

## 2019-05-07 MED ORDER — TRAMADOL HCL 50 MG PO TABS
50.0000 mg | ORAL_TABLET | Freq: Once | ORAL | Status: AC
  Administered 2019-05-07: 50 mg via ORAL
  Filled 2019-05-07: qty 1

## 2019-05-07 MED FILL — traMADol HCL 50 MG TABS: 50 | 3 days supply | Qty: 15 | Fill #0

## 2019-05-07 MED FILL — ONDANSETRON HCL 4 MG TABLET: 4 | 5 days supply | Qty: 12 | Fill #0

## 2019-05-07 NOTE — ED Notes (Signed)
Boyfriends number for updates: Marchia Bond [phone]

## 2019-05-07 NOTE — ED Triage Notes (Signed)
Patient reports low back pain and emesis this evening , denies injury , no diarrhea or fever .

## 2019-05-07 NOTE — ED Provider Notes (Signed)
[facility] EMERGENCY DEPARTMENT Provider Note   CSN: [id number] Arrival date & time: 05/07/19  5188   History Chief Complaint  Patient presents with  . Back Pain  . Emesis    Brandy Stewart is a 69 y.o. female.  The history is provided by the patient.  Back Pain Emesis She has history of hypertension, ITP, chronic back pain and comes in complaining of low back pain, nausea, vomiting.  She has had an increase in her lower back pain over the last week.  Pain is a across the lower back and radiates into both legs.  She rates pain at 6/10.  She denies numbness or tingling.  She denies bowel or bladder dysfunction.  She complains of being generally weak but no focal weakness.  He was seen in urgent care 2 days ago and sent home with prescriptions for prednisone and hydrocodone-acetaminophen.  Since then, she has had problems with nausea and vomiting.  She has vomited about 5 times in the last 24 hours.  She denies diarrhea.  She is complaining of some heartburn.  She denies any exposure to COVID-19.  Past Medical History:  Diagnosis Date  . Leukemia [facility])     Patient Active Problem List   Diagnosis Date Noted  . Dysuria 04/12/2018  . Infected dental carries 04/12/2018  . Osteopenia after menopause 04/12/2018  . Breast cancer screening 04/12/2018  . Chronic midline low back pain with bilateral sciatica 04/28/2015  . DDD (degenerative disc disease), lumbosacral 04/08/2015  . Essential hypertension 04/08/2015  . Fatty liver 04/08/2015  . 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 Impaired fasting glucose 04/08/2015  . Lumbosacral spondylosis 04/08/2015  . Sciatica 04/08/2015  . Thrombocytopenia ([place]) 04/08/2015  . Vitamin D deficiency 04/08/2015  . Neutropenia ([place]) 11/15/2014    Past Surgical History:  Procedure Laterality Date  . PARTIAL HYSTERECTOMY       OB History   No obstetric history on file.     Family History  Problem Relation Age of Onset  . Diabetes  Mother   . Hypertension Mother   . Diabetes Father   . Hypertension Father     Social History   Tobacco Use  . Smoking status: Never Smoker  . Smokeless tobacco: Never Used  Substance Use Topics  . Alcohol use: No  . Drug use: No    Home Medications Prior to Admission medications   Medication Sig Start Date End Date Taking? Authorizing Provider  Blood Glucose Monitoring Suppl (BLOOD GLUCOSE MONITOR SYSTEM) w/Device KIT 1 Bag by Does not apply route 3 (three) times daily as needed. Patient not taking: Reported on 05/01/2019 03/01/19   Kerin Perna, NP  Blood Pressure Monitoring (BLOOD PRESSURE MONITOR AUTOMAT) DEVI 1 kit by Does not apply route 3 (three) times daily. 01/25/19   Kerin Perna, NP  cetirizine (ZYRTEC) 10 MG tablet Take 1 tablet (10 mg total) by mouth daily. 05/01/19   Kerin Perna, NP  diclofenac Sodium (VOLTAREN) 1 % GEL Apply 4 g topically 4 (four) times daily. 02/10/19   Kerin Perna, NP  HYDROcodone-acetaminophen (NORCO/VICODIN) 5-325 MG tablet Take 2 tablets by mouth every 6 (six) hours as needed for up to 3 days. 05/05/19 05/08/19  Loura Halt A, NP  ibuprofen (ADVIL) 600 MG tablet Take 1 tablet (600 mg total) by mouth every 8 (eight) hours as needed. 02/05/19   Kerin Perna, NP  predniSONE (STERAPRED UNI-PAK 21 TAB) 10 MG (21) TBPK tablet 6 tabs  for 1 day, then 5 tabs for 1 das, then 4 tabs for 1 day, then 3 tabs for 1 day, 2 tabs for 1 day, then 1 tab for 1 day 05/05/19   Loura Halt A, NP    Allergies    Penicillins  Review of Systems   Review of Systems  Gastrointestinal: Positive for vomiting.  Musculoskeletal: Positive for back pain.  All other systems reviewed and are negative.   Physical Exam Updated Vital Signs BP 99/71 (BP Location: Left Arm)   Pulse 97   Temp 99.1 F (37.3 C) (Oral)   Resp (!) 22   Ht '5\' 4"'$  (1.626 m)   Wt 105.7 kg   SpO2 92%   BMI 39.99 kg/m   Physical Exam Vitals and nursing note  reviewed.   69 year old female, resting comfortably and in no acute distress. Vital signs are significant for mildly elevated respiratory rate. Oxygen saturation is 92%, which is normal. Head is normocephalic and atraumatic. PERRLA, EOMI. Oropharynx is clear. Neck is nontender and supple without adenopathy or JVD. Back is mildly tender in the mid and lower lumbar area.  Straight leg raise is negative.  There is no CVA tenderness. Lungs are clear without rales, wheezes, or rhonchi. Chest is nontender. Heart has regular rate and rhythm without murmur. Abdomen is soft, flat, nontender without masses or hepatosplenomegaly and peristalsis is normoactive. Extremities have no cyanosis or edema, full range of motion is present. Skin is warm and dry without rash. Neurologic: Mental status is normal, cranial nerves are intact, there are no motor or sensory deficits.  She has normal strength on plantarflexion, dorsiflexion, hip flexion, knee extension.  ED Results / Procedures / Treatments   Labs (all labs ordered are listed, but only abnormal results are displayed) Labs Reviewed  COMPREHENSIVE METABOLIC PANEL - Abnormal; Notable for the following components:      Result Value   Sodium 134 (*)    Potassium 5.3 (*)    CO2 21 (*)    Glucose, Bld 135 (*)    Calcium 8.2 (*)    Albumin 3.0 (*)    AST 70 (*)    Total Bilirubin 2.6 (*)    All other components within normal limits  URINALYSIS, ROUTINE W REFLEX MICROSCOPIC - Abnormal; Notable for the following components:   Color, Urine AMBER (*)    APPearance HAZY (*)    Ketones, ur 5 (*)    Protein, ur >=300 (*)    Leukocytes,Ua MODERATE (*)    Bacteria, UA RARE (*)    All other components within normal limits  HEPATIC FUNCTION PANEL - Abnormal; Notable for the following components:   Total Protein 8.2 (*)    Albumin 3.1 (*)    AST 47 (*)    All other components within normal limits  CBC  LIPASE, BLOOD  TROPONIN I (HIGH SENSITIVITY)     EKG EKG Interpretation  Date/Time:  Monday May 07 2019 05:09:29 EDT Ventricular Rate:  87 PR Interval:    QRS Duration: 92 QT Interval:  345 QTC Calculation: 415 R Axis:   59 Text Interpretation: Sinus rhythm RSR' in V1 or V2, right VCD or RVH Borderline T abnormalities, anterior leads When compared with ECG of 06/24/2017, No significant change was found Confirmed by Delora Fuel ([id number]) on 05/07/2019 6:14:42 AM   Radiology DG Lumbar Spine Complete  Result Date: 05/05/2019 CLINICAL DATA:  LOWER back pain radiating into legs. EXAM: LUMBAR SPINE - COMPLETE 4+ VIEW COMPARISON:  04/14/2015 and 05/05/2015 FINDINGS: Degenerative changes are present in the LOWER lumbar spine with facet hypertrophy primarily at L3-4, L4-5, L5-S1. There is 5 millimeters anterolisthesis of L4 on L5. No acute fracture. No lytic or blastic lesions. Visualized bowel gas pattern is nonobstructed. IMPRESSION: 1. Grade 1 anterolisthesis of L4 on L5. 2. No evidence for acute abnormality. Electronically Signed   By: Nolon Nations M.D.   On: 05/05/2019 15:04    Procedures Procedures   Medications Ordered in ED Medications  sodium chloride flush (NS) 0.9 % injection 3 mL (has no administration in time range)  ondansetron (ZOFRAN-ODT) disintegrating tablet 4 mg (4 mg Oral Given 05/07/19 0402)    ED Course  I have reviewed the triage vital signs and the nursing notes.  Pertinent labs & imaging results that were available during my care of the patient were reviewed by me and considered in my medical decision making (see chart for details).  MDM Rules/Calculators/A&P Exacerbation of chronic back pain.  No red flags to suggest more serious pathology.  Nausea and vomiting which is likely side effect of hydrocodone.  Labs are significant for elevated potassium, AST, bilirubin with report of hemolysis.  Will recheck hepatic function studies to see if bilirubin and AST are related to hemolysis.  Urinalysis appears to be a  contaminated specimen.  Old records are reviewed confirming urgent care visit 2 days ago with discharge prescriptions of prednisone and hydrocodone-acetaminophen, x-ray significant only for grade 1 anterolisthesis L4 on L5, degenerative changes in lower lumbar spine.  Also, CT of abdomen and pelvis in May 2019 did not show any evidence of aneurysm.  She will be given IV fluids, ondansetron, ketorolac, methocarbamol.  Nausea is improved but back pain persists.  She will be discharged with prescription for ondansetron and tramadol, instructed to follow-up with PCP.  Recommended that she discontinue prednisone since she has not seen any benefit from the first 2 days of higher dose.  Final Clinical Impression(s) / ED Diagnoses Final diagnoses:  Acute exacerbation of chronic low back pain  Non-intractable vomiting with nausea, unspecified vomiting type    Rx / DC Orders ED Discharge Orders         Ordered    traMADol (ULTRAM) 50 MG tablet  Every 6 hours PRN     05/07/19 0713    ondansetron (ZOFRAN) 4 MG tablet  Every 6 hours PRN     05/07/19 0122           Delora Fuel, MD 24/11/46 [phone]

## 2019-05-10 MED ORDER — GLUCOSE 40 % PO GEL
15.00 | ORAL | Status: DC
Start: ? — End: 2019-05-10

## 2019-05-10 MED ORDER — MELATONIN 3 MG PO TABS
6.00 | ORAL_TABLET | ORAL | Status: DC
Start: 2019-05-10 — End: 2019-05-10

## 2019-05-10 MED ORDER — DEXAMETHASONE 4 MG PO TABS
6.00 | ORAL_TABLET | ORAL | Status: DC
Start: 2019-05-11 — End: 2019-05-10

## 2019-05-10 MED ORDER — GENERIC EXTERNAL MEDICATION
100.00 | Status: DC
Start: 2019-05-11 — End: 2019-05-10

## 2019-05-10 MED ORDER — TRAMADOL HCL 50 MG PO TABS
50.00 | ORAL_TABLET | ORAL | Status: DC
Start: ? — End: 2019-05-10

## 2019-05-10 MED ORDER — ALBUTEROL SULFATE HFA 108 (90 BASE) MCG/ACT IN AERS
8.00 | INHALATION_SPRAY | RESPIRATORY_TRACT | Status: DC
Start: 2019-05-11 — End: 2019-05-10

## 2019-05-10 MED ORDER — DEXTROSE 10 % IV SOLN
125.00 | INTRAVENOUS | Status: DC
Start: ? — End: 2019-05-10

## 2019-05-10 MED ORDER — ACETAMINOPHEN 325 MG PO TABS
650.00 | ORAL_TABLET | ORAL | Status: DC
Start: ? — End: 2019-05-10

## 2019-05-10 MED ORDER — CHOLECALCIFEROL 25 MCG (1000 UT) PO TABS
1000.00 | ORAL_TABLET | ORAL | Status: DC
Start: 2019-05-11 — End: 2019-05-10

## 2019-05-10 MED ORDER — LIDOCAINE 4 % EX PTCH
1.00 | MEDICATED_PATCH | CUTANEOUS | Status: DC
Start: 2019-05-11 — End: 2019-05-10

## 2019-05-10 MED ORDER — INSULIN LISPRO 100 UNIT/ML ~~LOC~~ SOLN
2.00 | SUBCUTANEOUS | Status: DC
Start: 2019-05-10 — End: 2019-05-10

## 2019-05-10 MED ORDER — THERA-M PO TABS
1.00 | ORAL_TABLET | ORAL | Status: DC
Start: 2019-05-11 — End: 2019-05-10

## 2019-05-10 MED ORDER — ENOXAPARIN SODIUM 40 MG/0.4ML ~~LOC~~ SOLN
40.00 | SUBCUTANEOUS | Status: DC
Start: 2019-05-11 — End: 2019-05-10

## 2019-05-10 MED ORDER — ASCORBIC ACID 500 MG PO TABS
500.00 | ORAL_TABLET | ORAL | Status: DC
Start: 2019-05-10 — End: 2019-05-10

## 2019-05-21 ENCOUNTER — Inpatient Hospital Stay (HOSPITAL_COMMUNITY)
Admission: EM | Admit: 2019-05-21 | Discharge: 2019-05-24 | DRG: 287 | Disposition: A | Discharge status: Home / Self Care | Payer: Medicare Other | Attending: Family Medicine | Admitting: Family Medicine

## 2019-05-21 ENCOUNTER — Emergency Department (HOSPITAL_COMMUNITY): Payer: Medicare Other

## 2019-05-21 ENCOUNTER — Other Ambulatory Visit: Payer: Self-pay

## 2019-05-21 DIAGNOSIS — U071 COVID-19: Secondary | ICD-10-CM | POA: Diagnosis not present

## 2019-05-21 DIAGNOSIS — Z8616 Personal history of COVID-19: Secondary | ICD-10-CM

## 2019-05-21 DIAGNOSIS — R9439 Abnormal result of other cardiovascular function study: Secondary | ICD-10-CM | POA: Diagnosis present

## 2019-05-21 DIAGNOSIS — R0789 Other chest pain: Secondary | ICD-10-CM | POA: Diagnosis not present

## 2019-05-21 DIAGNOSIS — Z8249 Family history of ischemic heart disease and other diseases of the circulatory system: Secondary | ICD-10-CM

## 2019-05-21 DIAGNOSIS — I10 Essential (primary) hypertension: Secondary | ICD-10-CM | POA: Diagnosis present

## 2019-05-21 DIAGNOSIS — K760 Fatty (change of) liver, not elsewhere classified: Secondary | ICD-10-CM | POA: Diagnosis present

## 2019-05-21 DIAGNOSIS — Z79891 Long term (current) use of opiate analgesic: Secondary | ICD-10-CM

## 2019-05-21 DIAGNOSIS — R002 Palpitations: Secondary | ICD-10-CM | POA: Diagnosis present

## 2019-05-21 DIAGNOSIS — Z6839 Body mass index (BMI) 39.0-39.9, adult: Secondary | ICD-10-CM

## 2019-05-21 DIAGNOSIS — Z856 Personal history of leukemia: Secondary | ICD-10-CM

## 2019-05-21 DIAGNOSIS — Z90710 Acquired absence of both cervix and uterus: Secondary | ICD-10-CM

## 2019-05-21 DIAGNOSIS — I119 Hypertensive heart disease without heart failure: Secondary | ICD-10-CM | POA: Diagnosis present

## 2019-05-21 DIAGNOSIS — I440 Atrioventricular block, first degree: Secondary | ICD-10-CM | POA: Diagnosis present

## 2019-05-21 DIAGNOSIS — Z862 Personal history of diseases of the blood and blood-forming organs and certain disorders involving the immune mechanism: Secondary | ICD-10-CM

## 2019-05-21 DIAGNOSIS — Z79899 Other long term (current) drug therapy: Secondary | ICD-10-CM

## 2019-05-21 DIAGNOSIS — Z880 Allergy status to penicillin: Secondary | ICD-10-CM

## 2019-05-21 DIAGNOSIS — Z833 Family history of diabetes mellitus: Secondary | ICD-10-CM

## 2019-05-21 DIAGNOSIS — R778 Other specified abnormalities of plasma proteins: Secondary | ICD-10-CM | POA: Diagnosis present

## 2019-05-21 DIAGNOSIS — R079 Chest pain, unspecified: Secondary | ICD-10-CM

## 2019-05-21 DIAGNOSIS — E669 Obesity, unspecified: Secondary | ICD-10-CM | POA: Diagnosis present

## 2019-05-21 HISTORY — DX: Chest pain, unspecified: R07.9

## 2019-05-21 HISTORY — DX: COVID-19: U07.1

## 2019-05-21 HISTORY — DX: Essential (primary) hypertension: I10

## 2019-05-21 HISTORY — DX: Acute respiratory failure with hypoxia: J96.01

## 2019-05-21 HISTORY — DX: Bradycardia, unspecified: R00.1

## 2019-05-21 HISTORY — DX: Pneumonia, unspecified organism: J18.9

## 2019-05-21 HISTORY — DX: Personal history of diseases of the blood and blood-forming organs and certain disorders involving the immune mechanism: Z86.2

## 2019-05-21 LAB — CBC WITH DIFFERENTIAL/PLATELET
Abs Immature Granulocytes: 0.01 10*3/uL (ref 0.00–0.07)
Basophils Absolute: 0 10*3/uL (ref 0.0–0.1)
Basophils Relative: 1 %
Eosinophils Absolute: 0 10*3/uL (ref 0.0–0.5)
Eosinophils Relative: 0 %
HCT: 41.8 % (ref 36.0–46.0)
Hemoglobin: 13 g/dL (ref 12.0–15.0)
Immature Granulocytes: 0 %
Lymphocytes Relative: 33 %
Lymphs Abs: 1.7 10*3/uL (ref 0.7–4.0)
MCH: 29.1 pg (ref 26.0–34.0)
MCHC: 31.1 g/dL (ref 30.0–36.0)
MCV: 93.5 fL (ref 80.0–100.0)
Monocytes Absolute: 0.6 10*3/uL (ref 0.1–1.0)
Monocytes Relative: 12 %
Neutro Abs: 2.7 10*3/uL (ref 1.7–7.7)
Neutrophils Relative %: 54 %
Platelets: 260 10*3/uL (ref 150–400)
RBC: 4.47 MIL/uL (ref 3.87–5.11)
RDW: 13.2 % (ref 11.5–15.5)
WBC: 5 10*3/uL (ref 4.0–10.5)
nRBC: 0 % (ref 0.0–0.2)

## 2019-05-21 LAB — BASIC METABOLIC PANEL
Anion gap: 12 (ref 5–15)
BUN: 11 mg/dL (ref 8–23)
CO2: 23 mmol/L (ref 22–32)
Calcium: 8.8 mg/dL — ABNORMAL LOW (ref 8.9–10.3)
Chloride: 101 mmol/L (ref 98–111)
Creatinine, Ser: 0.87 mg/dL (ref 0.44–1.00)
GFR calc Af Amer: >60 mL/min (ref >60)
GFR calc non Af Amer: >60 mL/min (ref >60)
Glucose, Bld: 116 mg/dL — ABNORMAL HIGH (ref 70–99)
Potassium: 3.9 mmol/L (ref 3.5–5.1)
Sodium: 136 mmol/L (ref 135–145)

## 2019-05-21 LAB — TROPONIN I (HIGH SENSITIVITY)
Troponin I (High Sensitivity): 17 ng/L (ref <18)
Troponin I (High Sensitivity): 25 ng/L — ABNORMAL HIGH (ref <18)

## 2019-05-21 MED ORDER — ALBUTEROL SULFATE HFA 108 (90 BASE) MCG/ACT IN AERS
6.0000 | INHALATION_SPRAY | Freq: Once | RESPIRATORY_TRACT | Status: AC
  Administered 2019-05-21: 6 via RESPIRATORY_TRACT
  Filled 2019-05-21: qty 6.7

## 2019-05-21 NOTE — ED Notes (Signed)
ED Provider at bedside. 

## 2019-05-21 NOTE — Discharge Instructions (Signed)
You were seen in the emergency department after an episode of palpitations and chest tightness that you had earlier today. Your work-up today in the emergency department is reassuring. Specifically, we do not see evidence of new or worsening pneumonia on your chest x-ray, or heart attack, or dehydration, or anemia. Possibly may be dealing with some lingering effects from your Covid disease. It is also possible that you had a brief arrhythmia, or rapid heart beat, which can be common after an infection like your own.  I do recommend that you follow-up with your primary care provider's office tomorrow. Your PCP may wish to see you in the office, or else order additional testing or labs as an outpatient.  If you have another similar episode, or begin having significant chest pain, lightheadedness, difficulty breathing, or feel like passing out, please call 911 return to the emergency department immediately.

## 2019-05-21 NOTE — ED Triage Notes (Signed)
Pt coming from home with chief complaint of shortness of breath/persistant covid symptoms. Pt reports chest tightness & productive cough with "white, bitter" substance. Pt stated that she took vitamins & all of the sudden her heart began racing. Pt O2 sats 92, up to 100 on 2L [place] No other complaints at this time

## 2019-05-21 NOTE — ED Notes (Signed)
This RN unable to collect 2nd troponin, RN Larene Beach to collect

## 2019-05-21 NOTE — ED Provider Notes (Signed)
[facility] EMERGENCY DEPARTMENT Provider Note   CSN: [id number] Arrival date & time: 05/21/19  1921     History Chief Complaint  Patient presents with  . Shortness of Breath  . COVID Positive    Brandy Stewart is a 69 y.o. female w/ hx of HTN, ITP, obesity, COVID-19 hospitalization 2 weeks ago, presented to emergency department with cough and chest palpitations. Patient reports she was discharged in the hospital proximally 7 days ago. She is to be doing well on room air other than some mild dyspnea. She reports today she had an episode of palpitations in her chest as well as tightness in her chest. She felt her heart was racing. She is lasted maybe 5 to 10 minutes and has since resolved. EMS reportedly brought her in on 2 L nasal cannula, although on arrival she was taken off of this and was satting well on room air. She was not discharged in the hospital nasal cannula.  She reports she has had a cough with clear sputum for several days since her discharge in the hospital.  According to care everywhere records, the patient was discharged from [facility] in [place] after hospitalization from 3/24-3/29. She had been admitted that time for nausea and vomiting and several days of diffuse weakness and fatigue. She received a 5-day course of remdesivir plus Decadron while in the hospital. She required 4 L nasal cannula initially but then weaned down to room air, discharged with SpO2 ~92% on exertion. She was instructed to continue quarantine until April 7th (14 days from positive testing).  She does not smoke or endorse hx of COPD/asthma or pulmonary disease. She denies hx of MI or cardiac disease.  HPI     Past Medical History:  Diagnosis Date  . Leukemia [facility])     Patient Active Problem List   Diagnosis Date Noted  . Dysuria 04/12/2018  . Infected dental carries 04/12/2018  . Osteopenia after menopause 04/12/2018  . Breast cancer screening  04/12/2018  . Chronic midline low back pain with bilateral sciatica 04/28/2015  . DDD (degenerative disc disease), lumbosacral 04/08/2015  . Essential hypertension 04/08/2015  . Fatty liver 04/08/2015  . 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 Impaired fasting glucose 04/08/2015  . Lumbosacral spondylosis 04/08/2015  . Sciatica 04/08/2015  . Thrombocytopenia ([place]) 04/08/2015  . Vitamin D deficiency 04/08/2015  . Neutropenia ([place]) 11/15/2014    Past Surgical History:  Procedure Laterality Date  . PARTIAL HYSTERECTOMY       OB History   No obstetric history on file.     Family History  Problem Relation Age of Onset  . Diabetes Mother   . Hypertension Mother   . Diabetes Father   . Hypertension Father     Social History   Tobacco Use  . Smoking status: Never Smoker  . Smokeless tobacco: Never Used  Substance Use Topics  . Alcohol use: No  . Drug use: No    Home Medications Prior to Admission medications   Medication Sig Start Date End Date Taking? Authorizing Provider  Blood Glucose Monitoring Suppl (BLOOD GLUCOSE MONITOR SYSTEM) w/Device KIT 1 Bag by Does not apply route 3 (three) times daily as needed. 03/01/19   Kerin Perna, NP  Blood Pressure Monitoring (BLOOD PRESSURE MONITOR AUTOMAT) DEVI 1 kit by Does not apply route 3 (three) times daily. 01/25/19   Kerin Perna, NP  ondansetron (ZOFRAN) 4 MG tablet Take 1 tablet (4 mg total) by  mouth every 6 (six) hours as needed for nausea or vomiting. 7/40/81   Delora Fuel, MD  traMADol (ULTRAM) 50 MG tablet Take 1 tablet (50 mg total) by mouth every 6 (six) hours as needed. 4/48/18   Delora Fuel, MD  cetirizine (ZYRTEC) 10 MG tablet Take 1 tablet (10 mg total) by mouth daily. Patient not taking: Reported on 05/07/2019 05/01/19 05/07/19  Kerin Perna, NP    Allergies    Penicillins  Review of Systems   Review of Systems  Constitutional: Negative for chills and fever.  HENT: Negative for ear pain and sore  throat.   Eyes: Negative for pain and visual disturbance.  Respiratory: Positive for cough, chest tightness and shortness of breath.   Cardiovascular: Positive for palpitations. Negative for chest pain and leg swelling.  Gastrointestinal: Negative for abdominal pain and vomiting.  Genitourinary: Negative for dysuria and hematuria.  Musculoskeletal: Negative for arthralgias and back pain.  Skin: Negative for color change and rash.  Neurological: Negative for syncope and headaches.  Psychiatric/Behavioral: Negative for agitation and confusion.  All other systems reviewed and are negative.   Physical Exam Updated Vital Signs BP 127/69 (BP Location: Right Arm)   Pulse 98   Temp 98.3 F (36.8 C) (Oral)   Resp (!) 26   Ht _0  (1.626 m)   Wt 105.7 kg   SpO2 98%   BMI 39.99 kg/m   Physical Exam Vitals and nursing note reviewed.  Constitutional:      General: She is not in acute distress.    Appearance: She is well-developed.  HENT:     Head: Normocephalic and atraumatic.  Eyes:     Conjunctiva/sclera: Conjunctivae normal.  Cardiovascular:     Rate and Rhythm: Normal rate and regular rhythm.     Heart sounds: No murmur.  Pulmonary:     Effort: Pulmonary effort is normal. No respiratory distress.     Breath sounds: Normal breath sounds. No decreased breath sounds or wheezing.     Comments: 98% on room air Abdominal:     Palpations: Abdomen is soft.     Tenderness: There is no abdominal tenderness.  Musculoskeletal:     Cervical back: Neck supple.     Right lower leg: No edema.     Left lower leg: No edema.  Skin:    General: Skin is warm and dry.  Neurological:     General: No focal deficit present.     Mental Status: She is alert and oriented to person, place, and time.     ED Results / Procedures / Treatments   Labs (all labs ordered are listed, but only abnormal results are displayed) Labs Reviewed  BASIC METABOLIC PANEL - Abnormal; Notable for the following  components:      Result Value   Glucose, Bld 116 (*)    Calcium 8.8 (*)    All other components within normal limits  TROPONIN I (HIGH SENSITIVITY) - Abnormal; Notable for the following components:   Troponin I (High Sensitivity) 25 (*)    All other components within normal limits  CBC WITH DIFFERENTIAL/PLATELET  TROPONIN I (HIGH SENSITIVITY)    EKG EKG Interpretation  Date/Time:  Monday May 21 2019 20:25:56 EDT Ventricular Rate:  91 PR Interval:    QRS Duration: 80 QT Interval:  334 QTC Calculation: 411 R Axis:   56 Text Interpretation: Sinus rhythm Baseline wander in lead(s) V5 No STEMI Confirmed by Octaviano Glow [phone]) on 05/21/2019 8:32:19 PM  Radiology DG Chest Portable 1 View  Result Date: 05/21/2019 CLINICAL DATA:  COVID positive. EXAM: PORTABLE CHEST 1 VIEW COMPARISON:  05/09/2019 FINDINGS: Airspace opacities have improved in the lungs. Continued mild residual patchy opacities. Mild cardiomegaly. No effusions. No acute bony abnormality. IMPRESSION: Continued but improving patchy bilateral airspace opacities related to COVID. Cardiomegaly. Electronically Signed   By: Rolm Baptise M.D.   On: 05/21/2019 20:36    Procedures Procedures (including critical care time)  Medications Ordered in ED Medications  albuterol (VENTOLIN HFA) 108 (90 Base) MCG/ACT inhaler 6 puff (6 puffs Inhalation Given 05/21/19 2026)    ED Course  I have reviewed the triage vital signs and the nursing notes.  Pertinent labs & imaging results that were available during my care of the patient were reviewed by me and considered in my medical decision making (see chart for details).  This patient complains of palpitations and shortness of breath .  This involves an extensive number of treatment options, and is a complaint that carries with it a high risk of complications and morbidity.  The differential diagnosis includes ACS vs. PE vs. Arrhythmia (SVT and others) vs. Bronchitis vs. PNA vs  other  PE less likely given symptoms were transient, no tachypnea or hypoxia for me on exam, although recent COVID diagnosis puts her at higher risk for hypercoaguble states.  Doubtful of massive PE clinically.  No persistent CP or cardiac risk factors suggestive of ACS.  She has a HEART score < 3.  Palpitations most suggestive of SVT, likely in the setting of her coughing and recent viral illness.  Will check electrolytes here.  Afebrile, no clear evidence of bacterial PNA.  Mildly tachycardic likely 2/2 anxiety, no SIRS criteria, doubt sepsis.  I ordered, reviewed, and interpreted labs, which included bloodwork, xrays I ordered medication albuterol for chest tightness I ordered imaging studies which included xray of the chest I independently visualized and interpreted imaging which showed continued patchy infiltrates consistent with viral PNA but no new consolidation or significant pleural effusion, and the monitor tracing which showed NSR Previous records obtained and reviewed showing [facility] discharge summary as noted in my H&P above  After the interventions stated above, I reevaluated the patient and found that she remained asymptomatic, mildly improved after albuterol.    However her troponin increased from 17 to 25, suggestive of some ischemic event.  We'll admit for serial trops, possible echo, will discuss CT PE prospects with hospitalist, although it's not clear that this clinically fits with PE.  We'll need to consult IV team to place another line.  Positive COVID test confirmed within past 30 days   Final Clinical Impression(s) / ED Diagnoses Final diagnoses:  Palpitations  Chest tightness    Rx / DC Orders ED Discharge Orders    None       Wyvonnia Dusky, MD 05/21/19 2352

## 2019-05-21 NOTE — ED Notes (Signed)
RN Larene Beach unable to collect 2nd troponin, Phlebotomy at bedside to collect

## 2019-05-22 ENCOUNTER — Encounter (HOSPITAL_COMMUNITY): Payer: Self-pay | Admitting: Internal Medicine

## 2019-05-22 ENCOUNTER — Observation Stay (HOSPITAL_BASED_OUTPATIENT_CLINIC_OR_DEPARTMENT_OTHER): Payer: Medicare Other

## 2019-05-22 DIAGNOSIS — J9601 Acute respiratory failure with hypoxia: Secondary | ICD-10-CM | POA: Insufficient documentation

## 2019-05-22 DIAGNOSIS — U071 COVID-19: Secondary | ICD-10-CM | POA: Diagnosis not present

## 2019-05-22 DIAGNOSIS — R079 Chest pain, unspecified: Secondary | ICD-10-CM

## 2019-05-22 DIAGNOSIS — I10 Essential (primary) hypertension: Secondary | ICD-10-CM | POA: Insufficient documentation

## 2019-05-22 LAB — CBC
HCT: 39.6 % (ref 36.0–46.0)
Hemoglobin: 12.4 g/dL (ref 12.0–15.0)
MCH: 29.2 pg (ref 26.0–34.0)
MCHC: 31.3 g/dL (ref 30.0–36.0)
MCV: 93.2 fL (ref 80.0–100.0)
Platelets: 273 10*3/uL (ref 150–400)
RBC: 4.25 MIL/uL (ref 3.87–5.11)
RDW: 13.2 % (ref 11.5–15.5)
WBC: 5.1 10*3/uL (ref 4.0–10.5)
nRBC: 0 % (ref 0.0–0.2)

## 2019-05-22 LAB — D-DIMER, QUANTITATIVE: D-Dimer, Quant: 0.39 ug/mL-FEU (ref 0.00–0.50)

## 2019-05-22 LAB — LIPID PANEL
Cholesterol: 111 mg/dL (ref 0–200)
HDL: 38 mg/dL — ABNORMAL LOW (ref >40)
LDL Cholesterol: 56 mg/dL (ref 0–99)
Total CHOL/HDL Ratio: 2.9 RATIO
Triglycerides: 85 mg/dL (ref <150)
VLDL: 17 mg/dL (ref 0–40)

## 2019-05-22 LAB — TROPONIN I (HIGH SENSITIVITY)
Troponin I (High Sensitivity): 25 ng/L — ABNORMAL HIGH (ref <18)
Troponin I (High Sensitivity): 18 ng/L — ABNORMAL HIGH (ref <18)

## 2019-05-22 LAB — CREATININE, SERUM
Creatinine, Ser: 0.83 mg/dL (ref 0.44–1.00)
GFR calc Af Amer: >60 mL/min (ref >60)
GFR calc non Af Amer: >60 mL/min (ref >60)

## 2019-05-22 LAB — HEPATIC FUNCTION PANEL
ALT: 26 U/L (ref 0–44)
AST: 28 U/L (ref 15–41)
Albumin: 3.2 g/dL — ABNORMAL LOW (ref 3.5–5.0)
Alkaline Phosphatase: 53 U/L (ref 38–126)
Bilirubin, Direct: 0.1 mg/dL (ref 0.0–0.2)
Indirect Bilirubin: 0.5 mg/dL (ref 0.3–0.9)
Total Bilirubin: 0.6 mg/dL (ref 0.3–1.2)
Total Protein: 7.4 g/dL (ref 6.5–8.1)

## 2019-05-22 LAB — ECHOCARDIOGRAM COMPLETE
Height: 64 in
Weight: 3728 oz

## 2019-05-22 LAB — TSH: TSH: 1.975 u[IU]/mL (ref 0.350–4.500)

## 2019-05-22 LAB — HIV ANTIBODY (ROUTINE TESTING W REFLEX): HIV Screen 4th Generation wRfx: NONREACTIVE

## 2019-05-22 MED ORDER — ACETAMINOPHEN 650 MG RE SUPP: 650.0000 mg | Freq: Four times a day (QID) | RECTAL | Status: DC | PRN

## 2019-05-22 MED ORDER — ONDANSETRON HCL 4 MG/2ML IJ SOLN: 4.0000 mg | Freq: Four times a day (QID) | INTRAMUSCULAR | Status: DC | PRN

## 2019-05-22 MED ORDER — ENOXAPARIN SODIUM 40 MG/0.4ML ~~LOC~~ SOLN
40.0000 mg | SUBCUTANEOUS | Status: DC
  Administered 2019-05-22: 40 mg via SUBCUTANEOUS
  Filled 2019-05-22: qty 0.4

## 2019-05-22 MED ORDER — ONDANSETRON HCL 4 MG PO TABS: 4.0000 mg | ORAL_TABLET | Freq: Four times a day (QID) | ORAL | Status: DC | PRN

## 2019-05-22 MED ORDER — REGADENOSON 0.4 MG/5ML IV SOLN
0.4000 mg | Freq: Once | INTRAVENOUS | Status: AC
  Administered 2019-05-23: 0.4 mg via INTRAVENOUS
  Filled 2019-05-22: qty 5

## 2019-05-22 MED ORDER — ACETAMINOPHEN 325 MG PO TABS
650.0000 mg | ORAL_TABLET | Freq: Four times a day (QID) | ORAL | Status: DC | PRN
  Administered 2019-05-23: 650 mg via ORAL
  Filled 2019-05-22: qty 2

## 2019-05-22 MED ORDER — ALUM & MAG HYDROXIDE-SIMETH 200-200-20 MG/5ML PO SUSP
30.0000 mL | ORAL | Status: DC | PRN
  Administered 2019-05-22: 30 mL via ORAL
  Filled 2019-05-22: qty 30

## 2019-05-22 MED ORDER — ASPIRIN EC 81 MG PO TBEC
81.0000 mg | DELAYED_RELEASE_TABLET | Freq: Every day | ORAL | Status: DC
  Administered 2019-05-22 – 2019-05-24 (×3): 81 mg via ORAL
  Filled 2019-05-22 (×3): qty 1

## 2019-05-22 NOTE — Care Management Obs Status (Signed)
[facility] NOTIFICATION   Patient Details  Name: Cessily Pugsley MRN: [id number] Date of Birth: 1951/01/13   Medicare Observation Status Notification Given:  Yes    Bethena Roys, RN 05/22/2019, 4:19 PM

## 2019-05-22 NOTE — Progress Notes (Signed)
  Echocardiogram 2D Echocardiogram has been performed.  Brandy Stewart 05/22/2019, 3:21 PM

## 2019-05-22 NOTE — Progress Notes (Signed)
Progress Note    Brandy Stewart  [id number] DOB: 1950-04-04  DOA: 05/21/2019 PCP: Kerin Perna, NP    Brief Narrative:   Chief complaint: chest pain  Medical records reviewed and are as summarized below:  Brandy Stewart is an 69 y.o. female with a past medical history that includes ITP/neutropenia, hypertension, recent COVID-19 with discharge May 14, 2019 resented to the emergency department chief complaint of chest pain.  Work-up reveals mildly elevated high-sensitivity troponins, chest x-ray with improving infiltrates, and normal sinus rhythm on her EKG.  Patient admitted by triad for chest pain rule out and cardiology consult  Assessment/Plan:   Principal Problem:   Chest pain Active Problems:   Essential hypertension   COVID-19   Fatty liver   History of ITP   1.  Chest pain.  Atypical.  She describes the pain as a "tightness" located left anterior nonradiating.  This morning reports the pain a 2 out of 10 which is an improvement.  She denies any worsening shortness of breath diaphoresis nausea or vomiting.  She also reports history of "heartburn".  Chest x-ray shows improving infiltrates.  EKG without acute changes.  High Sensitivity troponin 17>>25>>25>>18.  Evaluated by cardiology who opined chest pain atypical with no exertional episodes and a normal EKG.  Recommend if echo reveals normal EF and no effusion we will schedule inpatient Lexiscan Myoview for tomorrow -Supportive therapy -Follow echo results -Telemetry -Oxygen supplementation as indicated  #2.  Hypertension.  Patient has been lost to medical follow-up over the last several years.  Review indicates 3 months ago telemetry visit evaluation of blood pressure.  Plan at that time was for continued monitoring.  Currently blood pressure low end of normal. -Monitor  3.  COVID-19.  Recent Covid infection.  Chest x-ray as noted above.  Currently patient is not hypoxic she is afebrile nontoxic-appearing.   Was finishing up her quarantine post hospitalization with April 7 being the last day.    Family Communication/Anticipated D/C date and plan/Code Status   DVT prophylaxis: Lovenox ordered. Code Status: Full Code.  Family Communication: patient at bedside Disposition Plan: home likely tomorrow after stress test   Medical Consultants:    [facility] cardiology   Anti-Infectives:    None  Subjective:   Awake alert sitting up eating breakfast.  Reports chest "pressure" 2 out of 10.  Objective:    Vitals:   05/22/19 0830 05/22/19 0915 05/22/19 1000 05/22/19 1030  BP: 113/66 123/74 (!) 105/54 110/71  Pulse: 91 86 83 82  Resp: (!) 21 (!) 22 (!) 26 (!) 22  Temp:      TempSrc:      SpO2: 95% 97% 96% 96%  Weight:      Height:       No intake or output data in the 24 hours ending 05/22/19 1140 Filed Weights   05/21/19 1931  Weight: 105.7 kg    Exam: General: Awake alert sitting up in bed eating breakfast no acute distress CV: Regular rate and rhythm no murmur gallop or rub no lower extremity edema Respiratory: No increased work of breathing breath sounds are clear but slightly distant I hear no wheezes no crackles Abdomen: Obese soft positive bowel sounds throughout nontender to palpation no guarding or rebounding Musculoskeletal: Joints without swelling/erythema full range of motion moves all extremities spontaneously Neuro: Alert and oriented x3 speech clear facial symmetry cranial nerves II through XII grossly intact  Data Reviewed:   I have personally reviewed following labs  and imaging studies:  Labs: Labs show the following:   Basic Metabolic Panel: Recent Labs  Lab 05/21/19 2028 05/22/19 0110  NA 136  --   K 3.9  --   CL 101  --   CO2 23  --   GLUCOSE 116*  --   BUN 11  --   CREATININE 0.87 0.83  CALCIUM 8.8*  --    GFR Estimated Creatinine Clearance: 79 mL/min (by C-G formula based on SCr of 0.83 mg/dL). Liver Function Tests: Recent Labs  Lab  05/22/19 0110  AST 28  ALT 26  ALKPHOS 53  BILITOT 0.6  PROT 7.4  ALBUMIN 3.2*   No results for input(s): LIPASE, AMYLASE in the last 168 hours. No results for input(s): AMMONIA in the last 168 hours. Coagulation profile No results for input(s): INR, PROTIME in the last 168 hours.  CBC: Recent Labs  Lab 05/21/19 2028 05/22/19 0110  WBC 5.0 5.1  NEUTROABS 2.7  --   HGB 13.0 12.4  HCT 41.8 39.6  MCV 93.5 93.2  PLT 260 273   Cardiac Enzymes: No results for input(s): CKTOTAL, CKMB, CKMBINDEX, TROPONINI in the last 168 hours. BNP (last 3 results) No results for input(s): PROBNP in the last 8760 hours. CBG: No results for input(s): GLUCAP in the last 168 hours. D-Dimer: Recent Labs    05/22/19 0110  DDIMER 0.39   Hgb A1c: No results for input(s): HGBA1C in the last 72 hours. Lipid Profile: No results for input(s): CHOL, HDL, LDLCALC, TRIG, CHOLHDL, LDLDIRECT in the last 72 hours. Thyroid function studies: Recent Labs    05/22/19 0110  TSH 1.975   Anemia work up: No results for input(s): VITAMINB12, FOLATE, FERRITIN, TIBC, IRON, RETICCTPCT in the last 72 hours. Sepsis Labs: Recent Labs  Lab 05/21/19 2028 05/22/19 0110  WBC 5.0 5.1    Microbiology No results found for this or any previous visit (from the past 240 hour(s)).  Procedures and diagnostic studies:  DG Chest Portable 1 View  Result Date: 05/21/2019 CLINICAL DATA:  COVID positive. EXAM: PORTABLE CHEST 1 VIEW COMPARISON:  05/09/2019 FINDINGS: Airspace opacities have improved in the lungs. Continued mild residual patchy opacities. Mild cardiomegaly. No effusions. No acute bony abnormality. IMPRESSION: Continued but improving patchy bilateral airspace opacities related to COVID. Cardiomegaly. Electronically Signed   By: Rolm Baptise M.D.   On: 05/21/2019 20:36    Medications:   . aspirin EC  81 mg Oral Daily  . enoxaparin (LOVENOX) injection  40 mg Subcutaneous Q24H  . [START ON 05/23/2019]  regadenoson  0.4 mg Intravenous Once   Continuous Infusions:   LOS: 0 days   Radene Gunning NP [facility]   How to contact the [facility] Attending or Consulting provider  or covering provider during after hours [place], for this patient?  1. Check the care team in [facility] and look for a) attending/consulting [facility] provider listed and b) the [facility] team listed 2. Log into [email or web address] and use [place]'s universal password to access. If you do not have the password, please contact the hospital operator. 3. Locate the [facility] provider you are looking for under [facility] and page to a number that you can be directly reached. 4. If you still have difficulty reaching the provider, please page the [facility] (Director on Call) for the Hospitalists listed on amion for assistance.  05/22/2019, 11:40 AM

## 2019-05-22 NOTE — ED Notes (Signed)
Lunch Tray Ordered @ 1052.  

## 2019-05-22 NOTE — H&P (Signed)
History and Physical    Brandy Stewart [id number] DOB: 1950/11/22 DOA: 05/21/2019  PCP: Kerin Perna, NP  Patient coming from: Home.  Chief Complaint: Chest pain.  HPI: Brandy Stewart is a 69 y.o. female with previous history of ITP and neutropenia who was treated for Covid infection at [facility] discharged on May 14, 2019 started developing chest pain on May 17, 2019 4 days after discharge.  Pain is retrosternal nonradiating present even at rest.  Yesterday patient also had brief episode of palpitations along with chest pain.  Denies any associated shortness of breath fever chills or productive cough denies any abdominal pain or nausea vomiting.  ED Course: In the ER patient was afebrile and not hypoxic.  Chest x-ray showed improving infiltrates.  High-sensitivity troponins were 17 and increased to 25.  This point it was decided to admit for further observation.  CBC unremarkable metabolic panel and LFTs unremarkable.  D-dimer was negative.  TSH is normal.  EKG shows normal sinus rhythm.  Patient admitted for further observation.  Review of Systems: As per HPI, rest all negative.   Past Medical History:  Diagnosis Date  . Leukemia [facility])     Past Surgical History:  Procedure Laterality Date  . PARTIAL HYSTERECTOMY       reports that she has never smoked. She has never used smokeless tobacco. She reports that she does not drink alcohol or use drugs.  Allergies  Allergen Reactions  . Penicillins Rash    Has patient had a PCN reaction causing immediate rash, facial/tongue/throat swelling, SOB or lightheadedness with hypotension: Yes Has patient had a PCN reaction causing severe rash involving mucus membranes or skin necrosis: No Has patient had a PCN reaction that required hospitalization: No Has patient had a PCN reaction occurring within the last 10 years: No If all of the above answers are "NO", then may proceed with Cephalosporin use.     Family  History  Problem Relation Age of Onset  . Diabetes Mother   . Hypertension Mother   . Diabetes Father   . Hypertension Father     Prior to Admission medications   Medication Sig Start Date End Date Taking? Authorizing Provider  Blood Glucose Monitoring Suppl (BLOOD GLUCOSE MONITOR SYSTEM) w/Device KIT 1 Bag by Does not apply route 3 (three) times daily as needed. 03/01/19   Kerin Perna, NP  Blood Pressure Monitoring (BLOOD PRESSURE MONITOR AUTOMAT) DEVI 1 kit by Does not apply route 3 (three) times daily. 01/25/19   Kerin Perna, NP  ondansetron (ZOFRAN) 4 MG tablet Take 1 tablet (4 mg total) by mouth every 6 (six) hours as needed for nausea or vomiting. 8/92/11   Delora Fuel, MD  traMADol (ULTRAM) 50 MG tablet Take 1 tablet (50 mg total) by mouth every 6 (six) hours as needed. 9/41/74   Delora Fuel, MD  cetirizine (ZYRTEC) 10 MG tablet Take 1 tablet (10 mg total) by mouth daily. Patient not taking: Reported on 05/07/2019 05/01/19 05/07/19  Kerin Perna, NP    Physical Exam: Constitutional: Moderately built and nourished. Vitals:   05/21/19 1928 05/21/19 1930 05/21/19 1931 05/21/19 2200  BP: 137/70 137/70  127/69  Pulse: (!) 104 (!) 102  98  Resp: 17   (!) 26  Temp: 98.3 F (36.8 C)     TempSrc: Oral     SpO2: 95% 97%  98%  Weight:   105.7 kg   Height:   5' 4" (1.626 m)  Eyes: Anicteric no pallor. ENMT: No discharge from the ears eyes nose or mouth. Neck: No mass felt.  No neck rigidity. Respiratory: No rhonchi or crepitations. Cardiovascular: S1-S2 heard. Abdomen: Soft nontender bowel sounds present. Musculoskeletal: No edema. Skin: No rash. Neurologic: Alert awake oriented time place and person.  Moves all extremities. Psychiatric: Appears normal with normal affect.   Labs on Admission: I have personally reviewed following labs and imaging studies  CBC: Recent Labs  Lab 05/21/19 2028  WBC 5.0  NEUTROABS 2.7  HGB 13.0  HCT 41.8  MCV 93.5    PLT 469   Basic Metabolic Panel: Recent Labs  Lab 05/21/19 2028  NA 136  K 3.9  CL 101  CO2 23  GLUCOSE 116*  BUN 11  CREATININE 0.87  CALCIUM 8.8*   GFR: Estimated Creatinine Clearance: 75.4 mL/min (by C-G formula based on SCr of 0.87 mg/dL). Liver Function Tests: No results for input(s): AST, ALT, ALKPHOS, BILITOT, PROT, ALBUMIN in the last 168 hours. No results for input(s): LIPASE, AMYLASE in the last 168 hours. No results for input(s): AMMONIA in the last 168 hours. Coagulation Profile: No results for input(s): INR, PROTIME in the last 168 hours. Cardiac Enzymes: No results for input(s): CKTOTAL, CKMB, CKMBINDEX, TROPONINI in the last 168 hours. BNP (last 3 results) No results for input(s): PROBNP in the last 8760 hours. HbA1C: No results for input(s): HGBA1C in the last 72 hours. CBG: No results for input(s): GLUCAP in the last 168 hours. Lipid Profile: No results for input(s): CHOL, HDL, LDLCALC, TRIG, CHOLHDL, LDLDIRECT in the last 72 hours. Thyroid Function Tests: No results for input(s): TSH, T4TOTAL, FREET4, T3FREE, THYROIDAB in the last 72 hours. Anemia Panel: No results for input(s): VITAMINB12, FOLATE, FERRITIN, TIBC, IRON, RETICCTPCT in the last 72 hours. Urine analysis:    Component Value Date/Time   COLORURINE AMBER (A) 05/07/2019 0248   APPEARANCEUR HAZY (A) 05/07/2019 0248   LABSPEC 1.026 05/07/2019 0248   PHURINE 6.0 05/07/2019 0248   GLUCOSEU NEGATIVE 05/07/2019 0248   HGBUR NEGATIVE 05/07/2019 0248   BILIRUBINUR NEGATIVE 05/07/2019 0248   BILIRUBINUR negative 04/12/2018 0928   BILIRUBINUR negative 11/25/2017 0856   KETONESUR 5 (A) 05/07/2019 0248   PROTEINUR >=300 (A) 05/07/2019 0248   UROBILINOGEN 0.2 05/05/2019 1426   NITRITE NEGATIVE 05/07/2019 0248   LEUKOCYTESUR MODERATE (A) 05/07/2019 0248   Sepsis Labs: _0 (procalcitonin:4,lacticidven:4) )No results found for this or any previous visit (from the past 240 hour(s)).    Radiological Exams on Admission: DG Chest Portable 1 View  Result Date: 05/21/2019 CLINICAL DATA:  COVID positive. EXAM: PORTABLE CHEST 1 VIEW COMPARISON:  05/09/2019 FINDINGS: Airspace opacities have improved in the lungs. Continued mild residual patchy opacities. Mild cardiomegaly. No effusions. No acute bony abnormality. IMPRESSION: Continued but improving patchy bilateral airspace opacities related to COVID. Cardiomegaly. Electronically Signed   By: Rolm Baptise M.D.   On: 05/21/2019 20:36    EKG: Independently reviewed.  Normal sinus rhythm.  Assessment/Plan Principal Problem:   Chest pain Active Problems:   History of ITP    1. Chest pain with recent Covid infection -chest pain is present even at rest.  Will cycle cardiac markers since troponin is slightly increased from the first 1.  Cardiology notified.  On aspirin. 2. Brief episodes of palpitations -D-dimer was negative TSH is normal.  Observe in telemetry. 3. Recent Covid infection.  Not hypoxic or febrile at this time. 4. Previous history of ITP.   DVT prophylaxis: Lovenox. Code  Status: Full code. Family Communication: Discussed with patient. Disposition Plan: Home. Consults called: Cardiology. Admission status: Observation.   Rise Patience MD [facility] Pager [phone].  If 7PM-7AM, please contact night-coverage [email or web address] Password [facility]  05/22/2019, 12:45 AM

## 2019-05-22 NOTE — ED Notes (Signed)
Breakfast Ordered 

## 2019-05-22 NOTE — Consult Note (Signed)
CARDIOLOGY CONSULT NOTE       Patient ID: Brandy Stewart MRN: [id number] DOB/AGE: Feb 05, 1951 69 y.o.  Admit date: 05/21/2019 Referring Physician: Hal Hope Primary Physician: Kerin Perna, NP Primary Cardiologist: New Reason for Consultation: Chest Pain  Principal Problem:   Chest pain Active Problems:   History of ITP   HPI:  69 y.o. seen in ER at request of hospitalist for chest pain. No prior history of CAD. She has not had regular f/u with MD Trying to get in with primary in [place]. She has history of ITP/Neutropenia. Rx for COVID at [facility] with d/c 05/14/19 Post d/c last 5 days indicates tightness in chest. Non radiating and non exertional Denies pleuritic component. No fever or productive cough Brief palpitations x 1 no pre syncope no LE edema or calf pain. 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 Does all ADL's and walks a lot with no chest pain. Has daughter [name] in [place] and son in town . ECG in ER is normal with no acute ischemic changes or pericarditis  Troponin not very remarkable 25->25-> 18    ROS All other systems reviewed and negative except as noted above  Past Medical History:  Diagnosis Date  . Chest pain   . Leukemia ([place])     Family History  Problem Relation Age of Onset  . Diabetes Mother   . Hypertension Mother   . Diabetes Father   . Hypertension Father     Social History   Socioeconomic History  . Marital status: Widowed    Spouse name: Not on file  . Number of children: Not on file  . Years of education: Not on file  . Highest education level: Not on file  Occupational History  . Not on file  Tobacco Use  . Smoking status: Never Smoker  . Smokeless tobacco: Never Used  Substance and Sexual Activity  . Alcohol use: No  . Drug use: No  . Sexual activity: Yes    Comment: recently with partner of 1 year  Other Topics Concern  . Not on file  Social  History Narrative  . Not on file   Social Determinants of Health   Financial Resource Strain:   . Difficulty of Paying Living Expenses:   Food Insecurity:   . Worried About Charity fundraiser in the Last Year:   . Arboriculturist in the Last Year:   Transportation Needs:   . Film/video editor (Medical):   Marland Kitchen Lack of Transportation (Non-Medical):   Physical Activity:   . Days of Exercise per Week:   . Minutes of Exercise per Session:   Stress:   . Feeling of Stress :   Social Connections:   . Frequency of Communication with Friends and Family:   . Frequency of Social Gatherings with Friends and Family:   . Attends Religious Services:   . Active Member of Clubs or Organizations:   . Attends Archivist Meetings:   Marland Kitchen Marital Status:   Intimate Partner Violence:   . Fear of Current or Ex-Partner:   . Emotionally Abused:   Marland Kitchen Physically Abused:   . Sexually Abused:     Past Surgical History:  Procedure Laterality Date  . PARTIAL HYSTERECTOMY        Current Facility-Administered Medications:  .  acetaminophen (TYLENOL) tablet 650 mg, 650 mg, Oral, Q6H PRN **OR** acetaminophen (TYLENOL) suppository 650 mg,  650 mg, Rectal, Q6H PRN, Rise Patience, MD .  aspirin EC tablet 81 mg, 81 mg, Oral, Daily, Rise Patience, MD, 81 mg at 05/22/19 0917 .  enoxaparin (LOVENOX) injection 40 mg, 40 mg, Subcutaneous, Q24H, Rise Patience, MD, 40 mg at 05/22/19 0917 .  ondansetron (ZOFRAN) tablet 4 mg, 4 mg, Oral, Q6H PRN **OR** ondansetron (ZOFRAN) injection 4 mg, 4 mg, Intravenous, Q6H PRN, Rise Patience, MD  Current Outpatient Medications:  Marland Kitchen  Multiple Vitamin (MULTIVITAMIN WITH MINERALS) TABS tablet, Take 1 tablet by mouth daily., Disp: , Rfl:  .  traMADol (ULTRAM) 50 MG tablet, Take 1 tablet (50 mg total) by mouth every 6 (six) hours as needed. (Patient taking differently: Take 50 mg by mouth every 6 (six) hours as needed for moderate pain. ), Disp: 15  tablet, Rfl: 0 .  Blood Glucose Monitoring Suppl (BLOOD GLUCOSE MONITOR SYSTEM) w/Device KIT, 1 Bag by Does not apply route 3 (three) times daily as needed., Disp: 1 kit, Rfl: 0 .  Blood Pressure Monitoring (BLOOD PRESSURE MONITOR AUTOMAT) DEVI, 1 kit by Does not apply route 3 (three) times daily., Disp: 1 each, Rfl: 0 .  ondansetron (ZOFRAN) 4 MG tablet, Take 1 tablet (4 mg total) by mouth every 6 (six) hours as needed for nausea or vomiting. (Patient not taking: Reported on 05/22/2019), Disp: 12 tablet, Rfl: 0 . aspirin EC  81 mg Oral Daily  . enoxaparin (LOVENOX) injection  40 mg Subcutaneous Q24H     Physical Exam: Blood pressure 123/74, pulse 86, temperature 98.3 F (36.8 C), temperature source Oral, resp. rate (!) 22, height 5' 4"  (1.626 m), weight 105.7 kg, SpO2 97 %.  Affect appropriate Black female in no distress  HEENT: normal Neck supple with no adenopathy JVP normal no bruits no thyromegaly Lungs clear with no wheezing and good diaphragmatic motion Heart:  S1/S2 no murmur, no rub, gallop or click PMI normal Abdomen: benighn, BS positve, no tenderness, no AAA no bruit.  No HSM or HJR Distal pulses intact with no bruits No edema Neuro non-focal Skin warm and dry No muscular weakness  Labs:   Lab Results  Component Value Date   WBC 5.1 05/22/2019   HGB 12.4 05/22/2019   HCT 39.6 05/22/2019   MCV 93.2 05/22/2019   PLT 273 05/22/2019    Recent Labs  Lab 05/21/19 2028 05/21/19 2028 05/22/19 0110  NA 136  --   --   K 3.9  --   --   CL 101  --   --   CO2 23  --   --   BUN 11  --   --   CREATININE 0.87   < > 0.83  CALCIUM 8.8*  --   --   PROT  --   --  7.4  BILITOT  --   --  0.6  ALKPHOS  --   --  53  ALT  --   --  26  AST  --   --  28  GLUCOSE 116*  --   --    < > = values in this interval not displayed.   Lab Results  Component Value Date   TROPONINI <0.01 03/22/2017    Lab Results  Component Value Date   CHOL 149 10/25/2018   Lab Results    Component Value Date   HDL 51 10/25/2018   Lab Results  Component Value Date   LDLCALC 82 10/25/2018   Lab Results  Component  Value Date   TRIG 87 10/25/2018   Lab Results  Component Value Date   CHOLHDL 2.9 10/25/2018   No results found for: LDLDIRECT    Radiology: DG Lumbar Spine Complete  Result Date: 05/05/2019 CLINICAL DATA:  LOWER back pain radiating into legs. EXAM: LUMBAR SPINE - COMPLETE 4+ VIEW COMPARISON:  04/14/2015 and 05/05/2015 FINDINGS: Degenerative changes are present in the LOWER lumbar spine with facet hypertrophy primarily at L3-4, L4-5, L5-S1. There is 5 millimeters anterolisthesis of L4 on L5. No acute fracture. No lytic or blastic lesions. Visualized bowel gas pattern is nonobstructed. IMPRESSION: 1. Grade 1 anterolisthesis of L4 on L5. 2. No evidence for acute abnormality. Electronically Signed   By: Nolon Nations M.D.   On: 05/05/2019 15:04   DG Chest Portable 1 View  Result Date: 05/21/2019 CLINICAL DATA:  COVID positive. EXAM: PORTABLE CHEST 1 VIEW COMPARISON:  05/09/2019 FINDINGS: Airspace opacities have improved in the lungs. Continued mild residual patchy opacities. Mild cardiomegaly. No effusions. No acute bony abnormality. IMPRESSION: Continued but improving patchy bilateral airspace opacities related to COVID. Cardiomegaly. Electronically Signed   By: Rolm Baptise M.D.   On: 05/21/2019 20:36    EKG: NSR normal ECG    ASSESSMENT AND PLAN:   1. Chest Pain:  Atypical no exertional normal ECG. Troponin minimal elevation with no trend TTE today If normal EF and no effusion will order inpatient lexiscan myovue for am.   2. COVID:  Per primary service CXR showing improvement   Signed: Jenkins Rouge 05/22/2019, 10:31 AM

## 2019-05-22 NOTE — Plan of Care (Signed)
Pt educated on lexiscan stress test planned for AM. NPO after MN. Pt verbally understands. No needs.

## 2019-05-22 NOTE — ED Notes (Signed)
Brandy Stewart daughter [id number] looking for an update on the pt

## 2019-05-23 ENCOUNTER — Encounter (HOSPITAL_COMMUNITY): Payer: Self-pay | Admitting: Internal Medicine

## 2019-05-23 ENCOUNTER — Observation Stay (HOSPITAL_COMMUNITY): Payer: Medicare Other

## 2019-05-23 DIAGNOSIS — Z79899 Other long term (current) drug therapy: Secondary | ICD-10-CM | POA: Diagnosis not present

## 2019-05-23 DIAGNOSIS — I10 Essential (primary) hypertension: Secondary | ICD-10-CM | POA: Diagnosis not present

## 2019-05-23 DIAGNOSIS — E669 Obesity, unspecified: Secondary | ICD-10-CM | POA: Diagnosis present

## 2019-05-23 DIAGNOSIS — K760 Fatty (change of) liver, not elsewhere classified: Secondary | ICD-10-CM | POA: Diagnosis present

## 2019-05-23 DIAGNOSIS — Z6839 Body mass index (BMI) 39.0-39.9, adult: Secondary | ICD-10-CM | POA: Diagnosis not present

## 2019-05-23 DIAGNOSIS — Z856 Personal history of leukemia: Secondary | ICD-10-CM | POA: Diagnosis not present

## 2019-05-23 DIAGNOSIS — R778 Other specified abnormalities of plasma proteins: Secondary | ICD-10-CM | POA: Diagnosis present

## 2019-05-23 DIAGNOSIS — Z90710 Acquired absence of both cervix and uterus: Secondary | ICD-10-CM | POA: Diagnosis not present

## 2019-05-23 DIAGNOSIS — Z8249 Family history of ischemic heart disease and other diseases of the circulatory system: Secondary | ICD-10-CM | POA: Diagnosis not present

## 2019-05-23 DIAGNOSIS — Z79891 Long term (current) use of opiate analgesic: Secondary | ICD-10-CM | POA: Diagnosis not present

## 2019-05-23 DIAGNOSIS — I440 Atrioventricular block, first degree: Secondary | ICD-10-CM | POA: Diagnosis present

## 2019-05-23 DIAGNOSIS — R9439 Abnormal result of other cardiovascular function study: Secondary | ICD-10-CM | POA: Diagnosis not present

## 2019-05-23 DIAGNOSIS — Z880 Allergy status to penicillin: Secondary | ICD-10-CM | POA: Diagnosis not present

## 2019-05-23 DIAGNOSIS — Z8616 Personal history of COVID-19: Secondary | ICD-10-CM | POA: Diagnosis not present

## 2019-05-23 DIAGNOSIS — I119 Hypertensive heart disease without heart failure: Secondary | ICD-10-CM | POA: Diagnosis present

## 2019-05-23 DIAGNOSIS — Z862 Personal history of diseases of the blood and blood-forming organs and certain disorders involving the immune mechanism: Secondary | ICD-10-CM | POA: Diagnosis not present

## 2019-05-23 DIAGNOSIS — R079 Chest pain, unspecified: Secondary | ICD-10-CM | POA: Diagnosis not present

## 2019-05-23 DIAGNOSIS — R0789 Other chest pain: Secondary | ICD-10-CM | POA: Diagnosis present

## 2019-05-23 DIAGNOSIS — R002 Palpitations: Secondary | ICD-10-CM | POA: Diagnosis present

## 2019-05-23 DIAGNOSIS — Z833 Family history of diabetes mellitus: Secondary | ICD-10-CM | POA: Diagnosis not present

## 2019-05-23 MED ORDER — SODIUM CHLORIDE 0.9% FLUSH: 3.0000 mL | INTRAVENOUS | Status: DC | PRN

## 2019-05-23 MED ORDER — TECHNETIUM TC 99M TETROFOSMIN IV KIT
10.5000 | PACK | Freq: Once | INTRAVENOUS | Status: AC | PRN
  Administered 2019-05-23: 10.5 via INTRAVENOUS

## 2019-05-23 MED ORDER — SODIUM CHLORIDE 0.9% FLUSH
3.0000 mL | Freq: Two times a day (BID) | INTRAVENOUS | Status: DC
  Administered 2019-05-24: 3 mL via INTRAVENOUS

## 2019-05-23 MED ORDER — SODIUM CHLORIDE 0.9 % WEIGHT BASED INFUSION
3.0000 mL/kg/h | INTRAVENOUS | Status: AC
  Administered 2019-05-24: 3 mL/kg/h via INTRAVENOUS

## 2019-05-23 MED ORDER — METOPROLOL TARTRATE 12.5 MG HALF TABLET
12.5000 mg | ORAL_TABLET | Freq: Two times a day (BID) | ORAL | Status: DC
  Administered 2019-05-23 – 2019-05-24 (×4): 12.5 mg via ORAL
  Filled 2019-05-23 (×4): qty 1

## 2019-05-23 MED ORDER — ASPIRIN 81 MG PO TBEC: 81.0000 mg | DELAYED_RELEASE_TABLET | Freq: Every day | ORAL | 11 refills | Status: DC

## 2019-05-23 MED ORDER — REGADENOSON 0.4 MG/5ML IV SOLN
INTRAVENOUS | Status: AC
  Filled 2019-05-23: qty 5

## 2019-05-23 MED ORDER — SODIUM CHLORIDE 0.9 % IV SOLN: 250.0000 mL | INTRAVENOUS | Status: DC | PRN

## 2019-05-23 MED ORDER — SODIUM CHLORIDE 0.9 % WEIGHT BASED INFUSION: 1.0000 mL/kg/h | INTRAVENOUS | Status: DC

## 2019-05-23 MED ORDER — TECHNETIUM TC 99M TETROFOSMIN IV KIT
31.0000 | PACK | Freq: Once | INTRAVENOUS | Status: AC | PRN
  Administered 2019-05-23: 31 via INTRAVENOUS

## 2019-05-23 NOTE — Progress Notes (Signed)
Progress Note    Dorcie Lanser  [id number] DOB: [birth year]  DOA: 05/21/2019 PCP: Kerin Perna, NP    Brief Narrative:   Chief complaint: chest pain   Shamieka Przywara is an 69 y.o. female with a past medical history that includes ITP/neutropenia, hypertension, recent COVID-17 with discharge May 14, 2019 resented to the emergency department chief complaint of chest pain.  Work-up reveals mildly elevated high-sensitivity troponins, chest x-ray with improving infiltrates, and normal sinus rhythm on her EKG.  Patient admitted by [facility] for chest pain rule out and cardiology consult  Assessment/Plan:   Principal Problem:   Chest pain Active Problems:   Essential hypertension   Fatty liver   History of ITP   COVID-19   Abnormal stress test   1.  Chest pain.  Atypical.  She describes the pain as a "tightness" located left anterior nonradiating.  This morning reports the pain a 2 out of 10 which is an improvement.  She denies any worsening shortness of breath diaphoresis nausea or vomiting.  She also reports history of "heartburn".  Chest x-ray shows improving infiltrates.  EKG without acute changes.  High Sensitivity troponin 17>>25>>25>>18. Echo EF [id number] diastolic dysfunction stress test showed abnormality and she is considering cardiac cath in a.m. 4/8  #2.  Hypertension.  Patient has been lost to medical follow-up over the last several years.   Continue metoprolol 12.5 twice daily  3.  COVID-19.  Recent Covid infection.  Chest x-ray as noted above.  Currently patient is not hypoxic she is afebrile nontoxic-appearing.  Was finishing up her quarantine post hospitalization with April 7 being the last day.    Family Communication/Anticipated D/C date and plan/Code Status   DVT prophylaxis: Lovenox ordered. Code Status: Full Code.  Family Communication: patient at bedside Disposition Plan: Going for cardiac cath subsequently plan as per cardiology   Medical  Consultants:    [facility] cardiology   Anti-Infectives:    None  Subjective:   Mild discomfort in right side  Objective:    Vitals:   05/23/19 1121 05/23/19 1124 05/23/19 1126 05/23/19 1128  BP: 115/70 (!) 154/72 101/71 111/65  Pulse:      Resp:      Temp:      TempSrc:      SpO2:      Weight:      Height:        Intake/Output Summary (Last 24 hours) at 05/23/2019 1627 Last data filed at 05/23/2019 [id number] Gross per 24 hour  Intake 600 ml  Output 1050 ml  Net -450 ml   Filed Weights   05/21/19 1931 05/22/19 1520  Weight: 105.7 kg 106.1 kg    Exam: Alert pleasant no distress EOMI NCAT no focal deficit S1-S2 no murmur rub or gallop telemetry not reviewed today chest clear no added sound no rales no rhonchi no flank pain Abdomen soft nontender no lower extremity edema  Data Reviewed:   I have personally reviewed following labs and imaging studies:  Labs: Labs show the following:   Basic Metabolic Panel: Recent Labs  Lab 05/21/19 2028 05/22/19 0110  NA 136  --   K 3.9  --   CL 101  --   CO2 23  --   GLUCOSE 116*  --   BUN 11  --   CREATININE 0.87 0.83  CALCIUM 8.8*  --    GFR Estimated Creatinine Clearance: 79.3 mL/min (by C-G formula based on SCr of 0.83 mg/dL). Liver Function  Tests: Recent Labs  Lab 05/22/19 0110  AST 28  ALT 26  ALKPHOS 53  BILITOT 0.6  PROT 7.4  ALBUMIN 3.2*   No results for input(s): LIPASE, AMYLASE in the last 168 hours. No results for input(s): AMMONIA in the last 168 hours. Coagulation profile No results for input(s): INR, PROTIME in the last 168 hours.  CBC: Recent Labs  Lab 05/21/19 2028 05/22/19 0110  WBC 5.0 5.1  NEUTROABS 2.7  --   HGB 13.0 12.4  HCT 41.8 39.6  MCV 93.5 93.2  PLT 260 273   Cardiac Enzymes: No results for input(s): CKTOTAL, CKMB, CKMBINDEX, TROPONINI in the last 168 hours. BNP (last 3 results) No results for input(s): PROBNP in the last 8760 hours. CBG: No results for input(s): GLUCAP  in the last 168 hours. D-Dimer: Recent Labs    05/22/19 0110  DDIMER 0.39   Hgb A1c: No results for input(s): HGBA1C in the last 72 hours. Lipid Profile: Recent Labs    05/22/19 0427  CHOL 111  HDL 38*  LDLCALC 56  TRIG 85  CHOLHDL 2.9   Thyroid function studies: Recent Labs    05/22/19 0110  TSH 1.975   Anemia work up: No results for input(s): VITAMINB12, FOLATE, FERRITIN, TIBC, IRON, RETICCTPCT in the last 72 hours. Sepsis Labs: Recent Labs  Lab 05/21/19 2028 05/22/19 0110  WBC 5.0 5.1    Microbiology No results found for this or any previous visit (from the past 240 hour(s)).  Procedures and diagnostic studies:  NM Myocar Multi W/Spect W/Wall Motion / EF  Result Date: 05/23/2019  There was no ST segment deviation noted during stress. T wave inversion was noted during stress.  The left ventricular ejection fraction is normal (55-65%).  Nuclear stress EF: 60%.  Defect 1: There is a small defect of mild severity present in the mid anteroseptal, apical septal and apex location.  Defect 2: There is a small defect of mild severity present in the mid anterolateral and apical lateral location.  Findings consistent with ischemia.  This is an intermediate risk study.  1. Small reversible mild perfusion defect in anteroseptum and apex suggestive of ischemia. 2. Small reversible mild perfusion defect in anterolateral wall suggestive of ischemia.   DG Chest Portable 1 View  Result Date: 05/21/2019 CLINICAL DATA:  COVID positive. EXAM: PORTABLE CHEST 1 VIEW COMPARISON:  05/09/2019 FINDINGS: Airspace opacities have improved in the lungs. Continued mild residual patchy opacities. Mild cardiomegaly. No effusions. No acute bony abnormality. IMPRESSION: Continued but improving patchy bilateral airspace opacities related to COVID. Cardiomegaly. Electronically Signed   By: Rolm Baptise M.D.   On: 05/21/2019 20:36   ECHOCARDIOGRAM COMPLETE  Result Date: 05/22/2019    ECHOCARDIOGRAM  REPORT   Patient Name:   Brandy Stewart Date of Exam: 05/22/2019 Medical Rec #:  [id number]      Height:       64.0 in Accession #:    [id number]     Weight:       233.0 lb Date of Birth:  26-Nov-1950     BSA:          2.088 m Patient Age:    69 years       BP:           111/72 mmHg Patient Gender: F              HR:           83 bpm. Exam Location:  Inpatient Procedure:  2D Echo, Cardiac Doppler and Color Doppler Indications:    Chest Pain 786.50  History:        Patient has no prior history of Echocardiogram examinations.                 Signs/Symptoms:Chest Pain; Risk Factors:Hypertension and                 Non-Smoker.  Sonographer:    Vickie Epley RDCS Referring Phys: [place]  1. Left ventricular ejection fraction, by estimation, is 50 to 55%. The left ventricle has low normal function. The left ventricle has no regional wall motion abnormalities. Left ventricular diastolic parameters are consistent with Grade I diastolic dysfunction (impaired relaxation).  2. Right ventricular systolic function is normal. The right ventricular size is normal.  3. The mitral valve is normal in structure. No evidence of mitral valve regurgitation. No evidence of mitral stenosis.  4. The aortic valve is normal in structure. Aortic valve regurgitation is not visualized. No aortic stenosis is present.  5. The inferior vena cava is normal in size with greater than 50% respiratory variability, suggesting right atrial pressure of 3 mmHg. FINDINGS  Left Ventricle: Left ventricular ejection fraction, by estimation, is 50 to 55%. The left ventricle has low normal function. The left ventricle has no regional wall motion abnormalities. The left ventricular internal cavity size was normal in size. There is no left ventricular hypertrophy. Left ventricular diastolic parameters are consistent with Grade I diastolic dysfunction (impaired relaxation). Right Ventricle: The right ventricular size is normal. No increase in right  ventricular wall thickness. Right ventricular systolic function is normal. Left Atrium: Left atrial size was normal in size. Right Atrium: Right atrial size was normal in size. Pericardium: There is no evidence of pericardial effusion. Mitral Valve: The mitral valve is normal in structure. Normal mobility of the mitral valve leaflets. No evidence of mitral valve regurgitation. No evidence of mitral valve stenosis. Tricuspid Valve: The tricuspid valve is normal in structure. Tricuspid valve regurgitation is not demonstrated. No evidence of tricuspid stenosis. Aortic Valve: The aortic valve is normal in structure. Aortic valve regurgitation is not visualized. No aortic stenosis is present. Pulmonic Valve: The pulmonic valve was normal in structure. Pulmonic valve regurgitation is not visualized. No evidence of pulmonic stenosis. Aorta: The aortic root is normal in size and structure. Venous: The inferior vena cava is normal in size with greater than 50% respiratory variability, suggesting right atrial pressure of 3 mmHg. IAS/Shunts: No atrial level shunt detected by color flow Doppler.  LEFT VENTRICLE PLAX 2D LVIDd:         4.00 cm      Diastology LVIDs:         3.00 cm      LV e' lateral:   7.40 cm/s LV PW:         0.90 cm      LV E/e' lateral: 12.1 LV IVS:        0.90 cm      LV e' medial:    7.40 cm/s LVOT diam:     1.80 cm      LV E/e' medial:  12.1 LV SV:         40 LV SV Index:   19 LVOT Area:     2.54 cm  LV Volumes (MOD) LV vol d, MOD A2C: 96.1 ml LV vol d, MOD A4C: 117.0 ml LV vol s, MOD A2C: 56.1 ml LV vol s,  MOD A4C: 57.4 ml LV SV MOD A2C:     40.0 ml LV SV MOD A4C:     117.0 ml LV SV MOD BP:      49.2 ml RIGHT VENTRICLE RV S prime:     11.50 cm/s TAPSE (M-mode): 1.8 cm LEFT ATRIUM             Index       RIGHT ATRIUM           Index LA diam:        3.70 cm 1.77 cm/m  RA Area:     13.10 cm LA Vol (A2C):   29.5 ml 14.13 ml/m RA Volume:   28.40 ml  13.60 ml/m LA Vol (A4C):   27.3 ml 13.08 ml/m LA  Biplane Vol: 28.4 ml 13.60 ml/m  AORTIC VALVE LVOT Vmax:   82.90 cm/s LVOT Vmean:  59.200 cm/s LVOT VTI:    0.156 m  AORTA Ao Root diam: 2.90 cm MITRAL VALVE MV Area (PHT): 4.96 cm     SHUNTS MV Decel Time: 153 msec     Systemic VTI:  0.16 m MV E velocity: 89.20 cm/s   Systemic Diam: 1.80 cm MV A velocity: 112.00 cm/s MV E/A ratio:  0.80 Mihai Croitoru MD Electronically signed by Sanda Klein MD Signature Date/Time: 05/22/2019/4:05:21 PM    Final     Medications:   . aspirin EC  81 mg Oral Daily  . enoxaparin (LOVENOX) injection  40 mg Subcutaneous Q24H  . metoprolol tartrate  12.5 mg Oral BID  . regadenoson       Continuous Infusions:   LOS: 0 days   Nita Sells NP [facility]   How to contact the [facility] Attending or Consulting provider 7A - 7P or covering provider during after hours 7P -7A, for this patient?  1. Check the care team in [facility] and look for a) attending/consulting [facility] provider listed and b) the [facility] team listed 2. Log into [email or web address] and use [place]'s universal password to access. If you do not have the password, please contact the hospital operator. 3. Locate the [facility] provider you are looking for under [facility] and page to a number that you can be directly reached. 4. If you still have difficulty reaching the provider, please page the [facility] (Director on Call) for the Hospitalists listed on amion for assistance.  05/23/2019, 4:27 PM

## 2019-05-23 NOTE — Progress Notes (Signed)
   Patient presented for a nuclear stress test today. No immediate complications. Stress imaging is pending at this time.   Preliminary EKG findings may be listed in the chart, but the stress test result will not be finalized until perfusion imaging is complete.  Darreld Mclean, PA-C 05/23/2019 11:36 AM

## 2019-05-23 NOTE — Progress Notes (Addendum)
Progress Note  Patient Name: Brandy Stewart Date of Encounter: 05/23/2019  Primary Cardiologist: Jenkins Rouge, MD ([place])  Subjective   No acute overnight events. She still reports a little bit of chest tightness but states it has improved from yesterday. No significant shortness of breath but desatting some in the high 80's. Plan is for Myoview today.  Inpatient Medications    Scheduled Meds:  aspirin EC  81 mg Oral Daily   enoxaparin (LOVENOX) injection  40 mg Subcutaneous Q24H   regadenoson  0.4 mg Intravenous Once   Continuous Infusions:   PRN Meds: acetaminophen **OR** acetaminophen, alum & mag hydroxide-simeth, ondansetron **OR** ondansetron (ZOFRAN) IV   Vital Signs    Vitals:   05/22/19 1900 05/22/19 1930 05/22/19 2127 05/23/19 0645  BP: 108/63 112/67 108/73 104/61  Pulse: 91 90 84 78  Resp:   (!) 21 17  Temp:   98.3 F (36.8 C) 98.4 F (36.9 C)  TempSrc:   Oral Oral  SpO2: 99% 96% 95% 97%  Weight:      Height:        Intake/Output Summary (Last 24 hours) at 05/23/2019 0726 Last data filed at 05/23/2019 0649 Gross per 24 hour  Intake 600 ml  Output 1050 ml  Net -450 ml   Last 3 Weights 05/22/2019 05/21/2019 05/07/2019  Weight (lbs) 234 lb 233 lb 233 lb  Weight (kg) 106.142 kg 105.688 kg 105.688 kg      Telemetry    Normal sinus rhythm. 1st degree AV block. Rates in the 80's to low 100's.  - Personally Reviewed  ECG    No new ECG tracing today. - Personally Reviewed  Physical Exam   GEN: No acute distress.   Neck: Supple. Cardiac: RRR. No murmurs, rubs, or gallops. Radial pulses 2+ and equal bilaterally. Respiratory: Clear to auscultation bilaterally. No wheezes, rhonchi, or rales. GI: Soft, non-distended, and non-tender. Bowel sounds present.  MS: No lower extremity edema. No deformity. Skin: Warm and dry. Neuro:  No focal deficits. Psych: Normal affect. Responds appropriately.  Labs    High Sensitivity Troponin:   Recent Labs  Lab  05/07/19 0602 05/21/19 2028 05/21/19 2215 05/22/19 0110 05/22/19 0427  TROPONINIHS 7 17 25* 25* 18*      Chemistry Recent Labs  Lab 05/21/19 2028 05/22/19 0110  NA 136  --   K 3.9  --   CL 101  --   CO2 23  --   GLUCOSE 116*  --   BUN 11  --   CREATININE 0.87 0.83  CALCIUM 8.8*  --   PROT  --  7.4  ALBUMIN  --  3.2*  AST  --  28  ALT  --  26  ALKPHOS  --  53  BILITOT  --  0.6  GFRNONAA >60 >60  GFRAA >60 >60  ANIONGAP 12  --      Hematology Recent Labs  Lab 05/21/19 2028 05/22/19 0110  WBC 5.0 5.1  RBC 4.47 4.25  HGB 13.0 12.4  HCT 41.8 39.6  MCV 93.5 93.2  MCH 29.1 29.2  MCHC 31.1 31.3  RDW 13.2 13.2  PLT 260 273    BNPNo results for input(s): BNP, PROBNP in the last 168 hours.   DDimer  Recent Labs  Lab 05/22/19 0110  DDIMER 0.39     Radiology    DG Chest Portable 1 View  Result Date: 05/21/2019 CLINICAL DATA:  COVID positive. EXAM: PORTABLE CHEST 1 VIEW COMPARISON:  05/09/2019  FINDINGS: Airspace opacities have improved in the lungs. Continued mild residual patchy opacities. Mild cardiomegaly. No effusions. No acute bony abnormality. IMPRESSION: Continued but improving patchy bilateral airspace opacities related to COVID. Cardiomegaly. Electronically Signed   By: Rolm Baptise M.D.   On: 05/21/2019 20:36   ECHOCARDIOGRAM COMPLETE  Result Date: 05/22/2019    ECHOCARDIOGRAM REPORT   Patient Name:   Brandy Stewart Date of Exam: 05/22/2019 Medical Rec #:  [id number]      Height:       64.0 in Accession #:    [id number]     Weight:       233.0 lb Date of Birth:  Aug 29, 1950     BSA:          2.088 m Patient Age:    69 years       BP:           111/72 mmHg Patient Gender: F              HR:           83 bpm. Exam Location:  Inpatient Procedure: 2D Echo, Cardiac Doppler and Color Doppler Indications:    Chest Pain 786.50  History:        Patient has no prior history of Echocardiogram examinations.                 Signs/Symptoms:Chest Pain; Risk  Factors:Hypertension and                 Non-Smoker.  Sonographer:    Vickie Epley RDCS Referring Phys: [place]  1. Left ventricular ejection fraction, by estimation, is 50 to 55%. The left ventricle has low normal function. The left ventricle has no regional wall motion abnormalities. Left ventricular diastolic parameters are consistent with Grade I diastolic dysfunction (impaired relaxation).  2. Right ventricular systolic function is normal. The right ventricular size is normal.  3. The mitral valve is normal in structure. No evidence of mitral valve regurgitation. No evidence of mitral stenosis.  4. The aortic valve is normal in structure. Aortic valve regurgitation is not visualized. No aortic stenosis is present.  5. The inferior vena cava is normal in size with greater than 50% respiratory variability, suggesting right atrial pressure of 3 mmHg. FINDINGS  Left Ventricle: Left ventricular ejection fraction, by estimation, is 50 to 55%. The left ventricle has low normal function. The left ventricle has no regional wall motion abnormalities. The left ventricular internal cavity size was normal in size. There is no left ventricular hypertrophy. Left ventricular diastolic parameters are consistent with Grade I diastolic dysfunction (impaired relaxation). Right Ventricle: The right ventricular size is normal. No increase in right ventricular wall thickness. Right ventricular systolic function is normal. Left Atrium: Left atrial size was normal in size. Right Atrium: Right atrial size was normal in size. Pericardium: There is no evidence of pericardial effusion. Mitral Valve: The mitral valve is normal in structure. Normal mobility of the mitral valve leaflets. No evidence of mitral valve regurgitation. No evidence of mitral valve stenosis. Tricuspid Valve: The tricuspid valve is normal in structure. Tricuspid valve regurgitation is not demonstrated. No evidence of tricuspid stenosis. Aortic  Valve: The aortic valve is normal in structure. Aortic valve regurgitation is not visualized. No aortic stenosis is present. Pulmonic Valve: The pulmonic valve was normal in structure. Pulmonic valve regurgitation is not visualized. No evidence of pulmonic stenosis. Aorta: The aortic root is normal in size and structure. Venous:  The inferior vena cava is normal in size with greater than 50% respiratory variability, suggesting right atrial pressure of 3 mmHg. IAS/Shunts: No atrial level shunt detected by color flow Doppler.  LEFT VENTRICLE PLAX 2D LVIDd:         4.00 cm      Diastology LVIDs:         3.00 cm      LV e' lateral:   7.40 cm/s LV PW:         0.90 cm      LV E/e' lateral: 12.1 LV IVS:        0.90 cm      LV e' medial:    7.40 cm/s LVOT diam:     1.80 cm      LV E/e' medial:  12.1 LV SV:         40 LV SV Index:   19 LVOT Area:     2.54 cm  LV Volumes (MOD) LV vol d, MOD A2C: 96.1 ml LV vol d, MOD A4C: 117.0 ml LV vol s, MOD A2C: 56.1 ml LV vol s, MOD A4C: 57.4 ml LV SV MOD A2C:     40.0 ml LV SV MOD A4C:     117.0 ml LV SV MOD BP:      49.2 ml RIGHT VENTRICLE RV S prime:     11.50 cm/s TAPSE (M-mode): 1.8 cm LEFT ATRIUM             Index       RIGHT ATRIUM           Index LA diam:        3.70 cm 1.77 cm/m  RA Area:     13.10 cm LA Vol (A2C):   29.5 ml 14.13 ml/m RA Volume:   28.40 ml  13.60 ml/m LA Vol (A4C):   27.3 ml 13.08 ml/m LA Biplane Vol: 28.4 ml 13.60 ml/m  AORTIC VALVE LVOT Vmax:   82.90 cm/s LVOT Vmean:  59.200 cm/s LVOT VTI:    0.156 m  AORTA Ao Root diam: 2.90 cm MITRAL VALVE MV Area (PHT): 4.96 cm     SHUNTS MV Decel Time: 153 msec     Systemic VTI:  0.16 m MV E velocity: 89.20 cm/s   Systemic Diam: 1.80 cm MV A velocity: 112.00 cm/s MV E/A ratio:  0.80 Mihai Croitoru MD Electronically signed by Sanda Klein MD Signature Date/Time: 05/22/2019/4:05:21 PM    Final     Cardiac Studies   Echocardiogram 05/22/2019: Impressions:  1. Left ventricular ejection fraction, by estimation, is  50 to 55%. The  left ventricle has low normal function. The left ventricle has no regional  wall motion abnormalities. Left ventricular diastolic parameters are  consistent with Grade I diastolic  dysfunction (impaired relaxation).   2. Right ventricular systolic function is normal. The right ventricular  size is normal.   3. The mitral valve is normal in structure. No evidence of mitral valve  regurgitation. No evidence of mitral stenosis.   4. The aortic valve is normal in structure. Aortic valve regurgitation is  not visualized. No aortic stenosis is present.   5. The inferior vena cava is normal in size with greater than 50%  respiratory variability, suggesting right atrial pressure of 3 mmHg.   Patient Profile     69 y.o. female with a history of recent COVID-19 pneumonia, hypertension, ITP, and neutropenia who is being seen for chest pain.   Assessment & Plan  Chest Pain - EKG showed no acute findings. - High-sensitivity troponin minimally elevated and flat at 17 >> 25 >> 25 >> 18.  - Echo showed LVEF of 50-55% with normal wall motion and grade 1 diastolic dysfunction. - She still notes some chest tightness but states it has improved from yesterday. - Plan is for [employer] today. If low risk, no further cardiac work-up will be needed.  Hypertension - BP well controlled without any medications. - Continue to monitor.  Recent COVID-19 Pneumonia  - Management per primary team.   For questions or updates, please contact [place] Please consult [email or web address] for contact info under        Signed, Darreld Mclean, PA-C  05/23/2019, 7:26 AM    Patient examined chart reviewed. Exam with obese black female Low sats until deep breathing. SSCP with coughing No acute ECG changes Minimal elevation  In troponin with no trend. Lexiscan myovue this am Can be d/c home if non ischemic   Jenkins Rouge MD [facility]

## 2019-05-23 NOTE — Progress Notes (Addendum)
   Myoview came back abnormal with 2 small reversible defects suggestive of ischemia. Discussed with Dr. Johnsie Cancel and will plan for cardiac catheterization tomorrow. Discussed this with patient. The patient understands that risks include but are not limited to stroke (1 in 1000), death (1 in 34), kidney failure [usually temporary] (1 in 500), bleeding (1 in 200), allergic reaction [possibly serious] (1 in 200). Patient would like to think and pray about this procedure before proceeding. I will go ahead and put patient on the board for tomorrow and make NPO. I will check back in before leaving tonight to see if patient has made a decision.   Also received a page from RN that patient's heart rates increased to the 140's to 150's while bathing. She reported palpitations at this time but denied any chest pain. Resting heart rates now in the 100's to 110's. Reviewed telemetry - looks like sinus tachycardia. Will add low doses Lopressor 12.5mg  twice daily. Of note, during recent admission with COVID at [facility] she was noted to have sinus bradycardia at night with rates as low as 37 while sleeping. This was felt to be a possible complication of COVID. Given patient BMI, also wonder if she could have sleep apnea. Will need to monitor closely for bradycardia.   Darreld Mclean, PA-C 05/23/2019 2:56 PM  Update 05/23/2019 6:36PM:  Martin Majestic back by to see patient before leaving and after thinking about it, she agrees to proceed with cardiac cath tomorrow. Will place orders.  Darreld Mclean, PA-C 05/23/2019 6:37 PM

## 2019-05-23 NOTE — Care Management (Signed)
1642 05-23-19 Case Manager received referral for primary care provider. Patient was previously seeing a NP at the [facility]. Patient gave Case Manager verbal permission to contact daughter. Case Manager called daughter and patient has a scheduled appointment for new PCP. Appointment placed on the AVS. Case Manager will continue to follow for additional transition of care needs. Bethena Roys, RN,BSN Case Manager

## 2019-05-24 ENCOUNTER — Encounter (HOSPITAL_COMMUNITY)
Admission: EM | Disposition: A | Discharge status: Home / Self Care | Payer: Self-pay | Source: Home / Self Care | Attending: Family Medicine

## 2019-05-24 DIAGNOSIS — R9439 Abnormal result of other cardiovascular function study: Secondary | ICD-10-CM

## 2019-05-24 DIAGNOSIS — I10 Essential (primary) hypertension: Secondary | ICD-10-CM

## 2019-05-24 HISTORY — PX: LEFT HEART CATH AND CORONARY ANGIOGRAPHY: CATH118249

## 2019-05-24 LAB — CBC WITH DIFFERENTIAL/PLATELET
Abs Immature Granulocytes: 0.01 10*3/uL (ref 0.00–0.07)
Basophils Absolute: 0 10*3/uL (ref 0.0–0.1)
Basophils Relative: 1 %
Eosinophils Absolute: 0 10*3/uL (ref 0.0–0.5)
Eosinophils Relative: 1 %
HCT: 38.5 % (ref 36.0–46.0)
Hemoglobin: 12.3 g/dL (ref 12.0–15.0)
Immature Granulocytes: 0 %
Lymphocytes Relative: 50 %
Lymphs Abs: 2 10*3/uL (ref 0.7–4.0)
MCH: 29.6 pg (ref 26.0–34.0)
MCHC: 31.9 g/dL (ref 30.0–36.0)
MCV: 92.5 fL (ref 80.0–100.0)
Monocytes Absolute: 0.7 10*3/uL (ref 0.1–1.0)
Monocytes Relative: 17 %
Neutro Abs: 1.2 10*3/uL — ABNORMAL LOW (ref 1.7–7.7)
Neutrophils Relative %: 31 %
Platelets: 227 10*3/uL (ref 150–400)
RBC: 4.16 MIL/uL (ref 3.87–5.11)
RDW: 13.5 % (ref 11.5–15.5)
WBC: 4 10*3/uL (ref 4.0–10.5)
nRBC: 0 % (ref 0.0–0.2)

## 2019-05-24 LAB — NM MYOCAR MULTI W/SPECT W/WALL MOTION / EF
Estimated workload: 1 METS
Exercise duration (min): 0 min
Exercise duration (sec): 0 s
LV dias vol: 67 mL (ref 46–106)
LV sys vol: 27 mL
MPHR: 154 {beats}/min
Peak HR: 131 {beats}/min
Percent HR: 85 %
Rest HR: 86 {beats}/min
TID: 1.18

## 2019-05-24 LAB — RENAL FUNCTION PANEL
Albumin: 3.1 g/dL — ABNORMAL LOW (ref 3.5–5.0)
Anion gap: 10 (ref 5–15)
BUN: 9 mg/dL (ref 8–23)
CO2: 25 mmol/L (ref 22–32)
Calcium: 8.8 mg/dL — ABNORMAL LOW (ref 8.9–10.3)
Chloride: 104 mmol/L (ref 98–111)
Creatinine, Ser: 0.73 mg/dL (ref 0.44–1.00)
GFR calc Af Amer: >60 mL/min (ref >60)
GFR calc non Af Amer: >60 mL/min (ref >60)
Glucose, Bld: 106 mg/dL — ABNORMAL HIGH (ref 70–99)
Phosphorus: 4.3 mg/dL (ref 2.5–4.6)
Potassium: 4 mmol/L (ref 3.5–5.1)
Sodium: 139 mmol/L (ref 135–145)

## 2019-05-24 SURGERY — LEFT HEART CATH AND CORONARY ANGIOGRAPHY
Anesthesia: LOCAL

## 2019-05-24 MED ORDER — VERAPAMIL HCL 2.5 MG/ML IV SOLN
INTRAVENOUS | Status: AC
  Filled 2019-05-24: qty 2

## 2019-05-24 MED ORDER — LIDOCAINE HCL (PF) 1 % IJ SOLN
INTRAMUSCULAR | Status: DC | PRN
  Administered 2019-05-24: 2 mL

## 2019-05-24 MED ORDER — METOPROLOL TARTRATE 25 MG PO TABS: 12.5000 mg | ORAL_TABLET | Freq: Two times a day (BID) | ORAL | 5 refills | Status: DC

## 2019-05-24 MED ORDER — FENTANYL CITRATE (PF) 100 MCG/2ML IJ SOLN
INTRAMUSCULAR | Status: DC | PRN
  Administered 2019-05-24: 25 ug via INTRAVENOUS

## 2019-05-24 MED ORDER — IOHEXOL 350 MG/ML SOLN
INTRAVENOUS | Status: DC | PRN
  Administered 2019-05-24: 55 mL

## 2019-05-24 MED ORDER — HEPARIN SODIUM (PORCINE) 1000 UNIT/ML IJ SOLN
INTRAMUSCULAR | Status: AC
  Filled 2019-05-24: qty 1

## 2019-05-24 MED ORDER — SODIUM CHLORIDE 0.9 % IV SOLN: 250.0000 mL | INTRAVENOUS | Status: DC | PRN

## 2019-05-24 MED ORDER — HEPARIN (PORCINE) IN NACL 1000-0.9 UT/500ML-% IV SOLN
INTRAVENOUS | Status: DC | PRN
  Administered 2019-05-24 (×2): 500 mL

## 2019-05-24 MED ORDER — HEPARIN (PORCINE) IN NACL 1000-0.9 UT/500ML-% IV SOLN
INTRAVENOUS | Status: AC
  Filled 2019-05-24: qty 1000

## 2019-05-24 MED ORDER — SODIUM CHLORIDE 0.9 % IV SOLN: INTRAVENOUS | Status: AC

## 2019-05-24 MED ORDER — FENTANYL CITRATE (PF) 100 MCG/2ML IJ SOLN
INTRAMUSCULAR | Status: AC
  Filled 2019-05-24: qty 2

## 2019-05-24 MED ORDER — SODIUM CHLORIDE 0.9% FLUSH: 3.0000 mL | INTRAVENOUS | Status: DC | PRN

## 2019-05-24 MED ORDER — LABETALOL HCL 5 MG/ML IV SOLN: 10.0000 mg | INTRAVENOUS | Status: DC | PRN

## 2019-05-24 MED ORDER — VERAPAMIL HCL 2.5 MG/ML IV SOLN
INTRAVENOUS | Status: DC | PRN
  Administered 2019-05-24: 10 mL via INTRA_ARTERIAL

## 2019-05-24 MED ORDER — HEPARIN SODIUM (PORCINE) 1000 UNIT/ML IJ SOLN
INTRAMUSCULAR | Status: DC | PRN
  Administered 2019-05-24: 5000 [IU] via INTRAVENOUS

## 2019-05-24 MED ORDER — MIDAZOLAM HCL 2 MG/2ML IJ SOLN
INTRAMUSCULAR | Status: DC | PRN
  Administered 2019-05-24: 2 mg via INTRAVENOUS

## 2019-05-24 MED ORDER — SODIUM CHLORIDE 0.9% FLUSH: 3.0000 mL | Freq: Two times a day (BID) | INTRAVENOUS | Status: DC

## 2019-05-24 MED ORDER — LIDOCAINE HCL (PF) 1 % IJ SOLN
INTRAMUSCULAR | Status: AC
  Filled 2019-05-24: qty 30

## 2019-05-24 MED ORDER — MIDAZOLAM HCL 2 MG/2ML IJ SOLN
INTRAMUSCULAR | Status: AC
  Filled 2019-05-24: qty 2

## 2019-05-24 MED ORDER — HYDRALAZINE HCL 20 MG/ML IJ SOLN: 10.0000 mg | INTRAMUSCULAR | Status: DC | PRN

## 2019-05-24 MED FILL — METOPROLOL TARTRATE 25 MG T: 25 | 90 days supply | Qty: 90 | Fill #0

## 2019-05-24 SURGICAL SUPPLY — 11 items

## 2019-05-24 NOTE — Research (Signed)
CADF Informed Consent   Subject Name: Brandy Stewart  Subject met inclusion and exclusion criteria.  The informed consent form, study requirements and expectations were reviewed with the subject and questions and concerns were addressed prior to the signing of the consent form.  The subject verbalized understanding of the trail requirements.  The subject agreed to participate in the CADF trial and signed the informed consent.  The informed consent was obtained prior to performance of any protocol-specific procedures for the subject.  A copy of the signed informed consent was given to the subject and a copy was placed in the subject's medical record.  Mena Goes. 05/24/2019, 10:05 AM

## 2019-05-24 NOTE — H&P (View-Only) (Signed)
Progress Note  Patient Name: Brandy Stewart Date of Encounter: 05/24/2019  Primary Cardiologist: Jenkins Rouge, MD ([place])  Subjective   For cath today positive myovue yesterday   Inpatient Medications    Scheduled Meds: . aspirin EC  81 mg Oral Daily  . enoxaparin (LOVENOX) injection  40 mg Subcutaneous Q24H  . metoprolol tartrate  12.5 mg Oral BID  . sodium chloride flush  3 mL Intravenous Q12H   Continuous Infusions: . sodium chloride    . sodium chloride     PRN Meds: sodium chloride, acetaminophen **OR** acetaminophen, alum & mag hydroxide-simeth, ondansetron **OR** ondansetron (ZOFRAN) IV, sodium chloride flush   Vital Signs    Vitals:   05/23/19 1128 05/23/19 1750 05/23/19 2007 05/24/19 0629  BP: 111/65 133/77 125/77 114/72  Pulse:  (!) 106 95 79  Resp:  19 16 16   Temp:   98.3 F (36.8 C) 97.8 F (36.6 C)  TempSrc:   Oral Oral  SpO2:   97% 98%  Weight:    101.3 kg  Height:        Intake/Output Summary (Last 24 hours) at 05/24/2019 0741 Last data filed at 05/23/2019 2330 Gross per 24 hour  Intake 360 ml  Output --  Net 360 ml   Last 3 Weights 05/24/2019 05/22/2019 05/21/2019  Weight (lbs) 223 lb 4.8 oz 234 lb 233 lb  Weight (kg) 101.288 kg 106.142 kg 105.688 kg      Telemetry    Normal sinus rhythm. 1st degree AV block. Rates in the 80's to low 100's.  - Personally Reviewed  ECG    NSR no acute ST changes   Physical Exam   GEN: No acute distress.   Neck: Supple. Cardiac: RRR. No murmurs, rubs, or gallops. Radial pulses 2+ and equal bilaterally. Respiratory: Clear to auscultation bilaterally. No wheezes, rhonchi, or rales. GI: Soft, non-distended, and non-tender. Bowel sounds present.  MS: No lower extremity edema. No deformity. Skin: Warm and dry. Neuro:  No focal deficits. Psych: Normal affect. Responds appropriately.  Labs    High Sensitivity Troponin:   Recent Labs  Lab 05/07/19 0602 05/21/19 2028 05/21/19 2215 05/22/19 0110  05/22/19 0427  TROPONINIHS 7 17 25* 25* 18*      Chemistry Recent Labs  Lab 05/21/19 2028 05/22/19 0110 05/24/19 0340  NA 136  --  139  K 3.9  --  4.0  CL 101  --  104  CO2 23  --  25  GLUCOSE 116*  --  106*  BUN 11  --  9  CREATININE 0.87 0.83 0.73  CALCIUM 8.8*  --  8.8*  PROT  --  7.4  --   ALBUMIN  --  3.2* 3.1*  AST  --  28  --   ALT  --  26  --   ALKPHOS  --  53  --   BILITOT  --  0.6  --   GFRNONAA >60 >60 >60  GFRAA >60 >60 >60  ANIONGAP 12  --  10     Hematology Recent Labs  Lab 05/21/19 2028 05/22/19 0110 05/24/19 0340  WBC 5.0 5.1 4.0  RBC 4.47 4.25 4.16  HGB 13.0 12.4 12.3  HCT 41.8 39.6 38.5  MCV 93.5 93.2 92.5  MCH 29.1 29.2 29.6  MCHC 31.1 31.3 31.9  RDW 13.2 13.2 13.5  PLT 260 273 227    BNPNo results for input(s): BNP, PROBNP in the last 168 hours.   DDimer  Recent Labs  Lab 05/22/19 0110  DDIMER 0.39     Radiology    NM Myocar Multi W/Spect W/Wall Motion / EF  Result Date: 05/23/2019  There was no ST segment deviation noted during stress. T wave inversion was noted during stress.  The left ventricular ejection fraction is normal (55-65%).  Nuclear stress EF: 60%.  Defect 1: There is a small defect of mild severity present in the mid anteroseptal, apical septal and apex location.  Defect 2: There is a small defect of mild severity present in the mid anterolateral and apical lateral location.  Findings consistent with ischemia.  This is an intermediate risk study.  1. Small reversible mild perfusion defect in anteroseptum and apex suggestive of ischemia. 2. Small reversible mild perfusion defect in anterolateral wall suggestive of ischemia.   ECHOCARDIOGRAM COMPLETE  Result Date: 05/22/2019    ECHOCARDIOGRAM REPORT   Patient Name:   Brandy Stewart Date of Exam: 05/22/2019 Medical Rec #:  [id number]      Height:       64.0 in Accession #:    [id number]     Weight:       233.0 lb Date of Birth:  1951-01-03     BSA:          2.088 m  Patient Age:    69 years       BP:           111/72 mmHg Patient Gender: F              HR:           83 bpm. Exam Location:  Inpatient Procedure: 2D Echo, Cardiac Doppler and Color Doppler Indications:    Chest Pain 786.50  History:        Patient has no prior history of Echocardiogram examinations.                 Signs/Symptoms:Chest Pain; Risk Factors:Hypertension and                 Non-Smoker.  Sonographer:    Vickie Epley RDCS Referring Phys: [place]  1. Left ventricular ejection fraction, by estimation, is 50 to 55%. The left ventricle has low normal function. The left ventricle has no regional wall motion abnormalities. Left ventricular diastolic parameters are consistent with Grade I diastolic dysfunction (impaired relaxation).  2. Right ventricular systolic function is normal. The right ventricular size is normal.  3. The mitral valve is normal in structure. No evidence of mitral valve regurgitation. No evidence of mitral stenosis.  4. The aortic valve is normal in structure. Aortic valve regurgitation is not visualized. No aortic stenosis is present.  5. The inferior vena cava is normal in size with greater than 50% respiratory variability, suggesting right atrial pressure of 3 mmHg. FINDINGS  Left Ventricle: Left ventricular ejection fraction, by estimation, is 50 to 55%. The left ventricle has low normal function. The left ventricle has no regional wall motion abnormalities. The left ventricular internal cavity size was normal in size. There is no left ventricular hypertrophy. Left ventricular diastolic parameters are consistent with Grade I diastolic dysfunction (impaired relaxation). Right Ventricle: The right ventricular size is normal. No increase in right ventricular wall thickness. Right ventricular systolic function is normal. Left Atrium: Left atrial size was normal in size. Right Atrium: Right atrial size was normal in size. Pericardium: There is no evidence of  pericardial effusion. Mitral Valve: The mitral valve is normal in structure. Normal  mobility of the mitral valve leaflets. No evidence of mitral valve regurgitation. No evidence of mitral valve stenosis. Tricuspid Valve: The tricuspid valve is normal in structure. Tricuspid valve regurgitation is not demonstrated. No evidence of tricuspid stenosis. Aortic Valve: The aortic valve is normal in structure. Aortic valve regurgitation is not visualized. No aortic stenosis is present. Pulmonic Valve: The pulmonic valve was normal in structure. Pulmonic valve regurgitation is not visualized. No evidence of pulmonic stenosis. Aorta: The aortic root is normal in size and structure. Venous: The inferior vena cava is normal in size with greater than 50% respiratory variability, suggesting right atrial pressure of 3 mmHg. IAS/Shunts: No atrial level shunt detected by color flow Doppler.  LEFT VENTRICLE PLAX 2D LVIDd:         4.00 cm      Diastology LVIDs:         3.00 cm      LV e' lateral:   7.40 cm/s LV PW:         0.90 cm      LV E/e' lateral: 12.1 LV IVS:        0.90 cm      LV e' medial:    7.40 cm/s LVOT diam:     1.80 cm      LV E/e' medial:  12.1 LV SV:         40 LV SV Index:   19 LVOT Area:     2.54 cm  LV Volumes (MOD) LV vol d, MOD A2C: 96.1 ml LV vol d, MOD A4C: 117.0 ml LV vol s, MOD A2C: 56.1 ml LV vol s, MOD A4C: 57.4 ml LV SV MOD A2C:     40.0 ml LV SV MOD A4C:     117.0 ml LV SV MOD BP:      49.2 ml RIGHT VENTRICLE RV S prime:     11.50 cm/s TAPSE (M-mode): 1.8 cm LEFT ATRIUM             Index       RIGHT ATRIUM           Index LA diam:        3.70 cm 1.77 cm/m  RA Area:     13.10 cm LA Vol (A2C):   29.5 ml 14.13 ml/m RA Volume:   28.40 ml  13.60 ml/m LA Vol (A4C):   27.3 ml 13.08 ml/m LA Biplane Vol: 28.4 ml 13.60 ml/m  AORTIC VALVE LVOT Vmax:   82.90 cm/s LVOT Vmean:  59.200 cm/s LVOT VTI:    0.156 m  AORTA Ao Root diam: 2.90 cm MITRAL VALVE MV Area (PHT): 4.96 cm     SHUNTS MV Decel Time: 153 msec      Systemic VTI:  0.16 m MV E velocity: 89.20 cm/s   Systemic Diam: 1.80 cm MV A velocity: 112.00 cm/s MV E/A ratio:  0.80 Mihai Croitoru MD Electronically signed by Sanda Klein MD Signature Date/Time: 05/22/2019/4:05:21 PM    Final     Cardiac Studies   Echocardiogram 05/22/2019: Impressions:  1. Left ventricular ejection fraction, by estimation, is 50 to 55%. The  left ventricle has low normal function. The left ventricle has no regional  wall motion abnormalities. Left ventricular diastolic parameters are  consistent with Grade I diastolic  dysfunction (impaired relaxation).   2. Right ventricular systolic function is normal. The right ventricular  size is normal.   3. The mitral valve is normal in structure. No evidence of mitral  valve  regurgitation. No evidence of mitral stenosis.   4. The aortic valve is normal in structure. Aortic valve regurgitation is  not visualized. No aortic stenosis is present.   5. The inferior vena cava is normal in size with greater than 50%  respiratory variability, suggesting right atrial pressure of 3 mmHg.   Myovue:  There was no ST segment deviation noted during stress. T wave inversion was noted during stress.  The left ventricular ejection fraction is normal (55-65%).  Nuclear stress EF: 60%.  Defect 1: There is a small defect of mild severity present in the mid anteroseptal, apical septal and apex location.  Defect 2: There is a small defect of mild severity present in the mid anterolateral and apical lateral location.  Findings consistent with ischemia.  This is an intermediate risk study.   1. Small reversible mild perfusion defect in anteroseptum and apex suggestive of ischemia.  2. Small reversible mild perfusion defect in anterolateral wall suggestive of ischemia.  Patient Profile     69 y.o. female with a history of recent COVID-19 pneumonia, hypertension, ITP, and neutropenia who is being seen for chest pain.   Assessment &  Plan    Chest Pain - EKG showed no acute findings. - High-sensitivity troponin minimally elevated and flat at 17 >> 25 >> 25 >> 18.  - Echo showed LVEF of 50-55% with normal wall motion and grade 1 diastolic dysfunction. - myovue with small anteroseptal,anterolateral ischemia may be false positive but given recurrent SSCP for cath today   Hypertension - BP well controlled without any medications. - Continue to monitor.  Recent COVID-19 Pneumonia  - Management per primary team.   For questions or updates, please contact [place] Please consult [email or web address] for contact info under        Signed, Jenkins Rouge, MD  05/24/2019, 7:41 AM    Patient examined chart reviewed. Exam with obese black female Low sats until deep breathing. SSCP with coughing No acute ECG changes Minimal elevation  In troponin with no trend. Lexiscan myovue this am Can be d/c home if non ischemic   Jenkins Rouge MD [facility]

## 2019-05-24 NOTE — Progress Notes (Signed)
Progress Note  Patient Name: Brandy Stewart Date of Encounter: 05/24/2019  Primary Cardiologist: Jenkins Rouge, MD ([place])  Subjective   For cath today positive myovue yesterday   Inpatient Medications    Scheduled Meds: . aspirin EC  81 mg Oral Daily  . enoxaparin (LOVENOX) injection  40 mg Subcutaneous Q24H  . metoprolol tartrate  12.5 mg Oral BID  . sodium chloride flush  3 mL Intravenous Q12H   Continuous Infusions: . sodium chloride    . sodium chloride     PRN Meds: sodium chloride, acetaminophen **OR** acetaminophen, alum & mag hydroxide-simeth, ondansetron **OR** ondansetron (ZOFRAN) IV, sodium chloride flush   Vital Signs    Vitals:   05/23/19 1128 05/23/19 1750 05/23/19 2007 05/24/19 0629  BP: 111/65 133/77 125/77 114/72  Pulse:  (!) 106 95 79  Resp:  19 16 16   Temp:   98.3 F (36.8 C) 97.8 F (36.6 C)  TempSrc:   Oral Oral  SpO2:   97% 98%  Weight:    101.3 kg  Height:        Intake/Output Summary (Last 24 hours) at 05/24/2019 0741 Last data filed at 05/23/2019 2330 Gross per 24 hour  Intake 360 ml  Output --  Net 360 ml   Last 3 Weights 05/24/2019 05/22/2019 05/21/2019  Weight (lbs) 223 lb 4.8 oz 234 lb 233 lb  Weight (kg) 101.288 kg 106.142 kg 105.688 kg      Telemetry    Normal sinus rhythm. 1st degree AV block. Rates in the 80's to low 100's.  - Personally Reviewed  ECG    NSR no acute ST changes   Physical Exam   GEN: No acute distress.   Neck: Supple. Cardiac: RRR. No murmurs, rubs, or gallops. Radial pulses 2+ and equal bilaterally. Respiratory: Clear to auscultation bilaterally. No wheezes, rhonchi, or rales. GI: Soft, non-distended, and non-tender. Bowel sounds present.  MS: No lower extremity edema. No deformity. Skin: Warm and dry. Neuro:  No focal deficits. Psych: Normal affect. Responds appropriately.  Labs    High Sensitivity Troponin:   Recent Labs  Lab 05/07/19 0602 05/21/19 2028 05/21/19 2215 05/22/19 0110  05/22/19 0427  TROPONINIHS 7 17 25* 25* 18*      Chemistry Recent Labs  Lab 05/21/19 2028 05/22/19 0110 05/24/19 0340  NA 136  --  139  K 3.9  --  4.0  CL 101  --  104  CO2 23  --  25  GLUCOSE 116*  --  106*  BUN 11  --  9  CREATININE 0.87 0.83 0.73  CALCIUM 8.8*  --  8.8*  PROT  --  7.4  --   ALBUMIN  --  3.2* 3.1*  AST  --  28  --   ALT  --  26  --   ALKPHOS  --  53  --   BILITOT  --  0.6  --   GFRNONAA >60 >60 >60  GFRAA >60 >60 >60  ANIONGAP 12  --  10     Hematology Recent Labs  Lab 05/21/19 2028 05/22/19 0110 05/24/19 0340  WBC 5.0 5.1 4.0  RBC 4.47 4.25 4.16  HGB 13.0 12.4 12.3  HCT 41.8 39.6 38.5  MCV 93.5 93.2 92.5  MCH 29.1 29.2 29.6  MCHC 31.1 31.3 31.9  RDW 13.2 13.2 13.5  PLT 260 273 227    BNPNo results for input(s): BNP, PROBNP in the last 168 hours.   DDimer  Recent Labs  Lab 05/22/19 0110  DDIMER 0.39     Radiology    NM Myocar Multi W/Spect W/Wall Motion / EF  Result Date: 05/23/2019  There was no ST segment deviation noted during stress. T wave inversion was noted during stress.  The left ventricular ejection fraction is normal (55-65%).  Nuclear stress EF: 60%.  Defect 1: There is a small defect of mild severity present in the mid anteroseptal, apical septal and apex location.  Defect 2: There is a small defect of mild severity present in the mid anterolateral and apical lateral location.  Findings consistent with ischemia.  This is an intermediate risk study.  1. Small reversible mild perfusion defect in anteroseptum and apex suggestive of ischemia. 2. Small reversible mild perfusion defect in anterolateral wall suggestive of ischemia.   ECHOCARDIOGRAM COMPLETE  Result Date: 05/22/2019    ECHOCARDIOGRAM REPORT   Patient Name:   Brandy Stewart Date of Exam: 05/22/2019 Medical Rec #:  [id number]      Height:       64.0 in Accession #:    [id number]     Weight:       233.0 lb Date of Birth:  05/20/1950     BSA:          2.088 m  Patient Age:    69 years       BP:           111/72 mmHg Patient Gender: F              HR:           83 bpm. Exam Location:  Inpatient Procedure: 2D Echo, Cardiac Doppler and Color Doppler Indications:    Chest Pain 786.50  History:        Patient has no prior history of Echocardiogram examinations.                 Signs/Symptoms:Chest Pain; Risk Factors:Hypertension and                 Non-Smoker.  Sonographer:    Vickie Epley RDCS Referring Phys: [place]  1. Left ventricular ejection fraction, by estimation, is 50 to 55%. The left ventricle has low normal function. The left ventricle has no regional wall motion abnormalities. Left ventricular diastolic parameters are consistent with Grade I diastolic dysfunction (impaired relaxation).  2. Right ventricular systolic function is normal. The right ventricular size is normal.  3. The mitral valve is normal in structure. No evidence of mitral valve regurgitation. No evidence of mitral stenosis.  4. The aortic valve is normal in structure. Aortic valve regurgitation is not visualized. No aortic stenosis is present.  5. The inferior vena cava is normal in size with greater than 50% respiratory variability, suggesting right atrial pressure of 3 mmHg. FINDINGS  Left Ventricle: Left ventricular ejection fraction, by estimation, is 50 to 55%. The left ventricle has low normal function. The left ventricle has no regional wall motion abnormalities. The left ventricular internal cavity size was normal in size. There is no left ventricular hypertrophy. Left ventricular diastolic parameters are consistent with Grade I diastolic dysfunction (impaired relaxation). Right Ventricle: The right ventricular size is normal. No increase in right ventricular wall thickness. Right ventricular systolic function is normal. Left Atrium: Left atrial size was normal in size. Right Atrium: Right atrial size was normal in size. Pericardium: There is no evidence of  pericardial effusion. Mitral Valve: The mitral valve is normal in structure. Normal  mobility of the mitral valve leaflets. No evidence of mitral valve regurgitation. No evidence of mitral valve stenosis. Tricuspid Valve: The tricuspid valve is normal in structure. Tricuspid valve regurgitation is not demonstrated. No evidence of tricuspid stenosis. Aortic Valve: The aortic valve is normal in structure. Aortic valve regurgitation is not visualized. No aortic stenosis is present. Pulmonic Valve: The pulmonic valve was normal in structure. Pulmonic valve regurgitation is not visualized. No evidence of pulmonic stenosis. Aorta: The aortic root is normal in size and structure. Venous: The inferior vena cava is normal in size with greater than 50% respiratory variability, suggesting right atrial pressure of 3 mmHg. IAS/Shunts: No atrial level shunt detected by color flow Doppler.  LEFT VENTRICLE PLAX 2D LVIDd:         4.00 cm      Diastology LVIDs:         3.00 cm      LV e' lateral:   7.40 cm/s LV PW:         0.90 cm      LV E/e' lateral: 12.1 LV IVS:        0.90 cm      LV e' medial:    7.40 cm/s LVOT diam:     1.80 cm      LV E/e' medial:  12.1 LV SV:         40 LV SV Index:   19 LVOT Area:     2.54 cm  LV Volumes (MOD) LV vol d, MOD A2C: 96.1 ml LV vol d, MOD A4C: 117.0 ml LV vol s, MOD A2C: 56.1 ml LV vol s, MOD A4C: 57.4 ml LV SV MOD A2C:     40.0 ml LV SV MOD A4C:     117.0 ml LV SV MOD BP:      49.2 ml RIGHT VENTRICLE RV S prime:     11.50 cm/s TAPSE (M-mode): 1.8 cm LEFT ATRIUM             Index       RIGHT ATRIUM           Index LA diam:        3.70 cm 1.77 cm/m  RA Area:     13.10 cm LA Vol (A2C):   29.5 ml 14.13 ml/m RA Volume:   28.40 ml  13.60 ml/m LA Vol (A4C):   27.3 ml 13.08 ml/m LA Biplane Vol: 28.4 ml 13.60 ml/m  AORTIC VALVE LVOT Vmax:   82.90 cm/s LVOT Vmean:  59.200 cm/s LVOT VTI:    0.156 m  AORTA Ao Root diam: 2.90 cm MITRAL VALVE MV Area (PHT): 4.96 cm     SHUNTS MV Decel Time: 153 msec      Systemic VTI:  0.16 m MV E velocity: 89.20 cm/s   Systemic Diam: 1.80 cm MV A velocity: 112.00 cm/s MV E/A ratio:  0.80 Mihai Croitoru MD Electronically signed by Sanda Klein MD Signature Date/Time: 05/22/2019/4:05:21 PM    Final     Cardiac Studies   Echocardiogram 05/22/2019: Impressions:  1. Left ventricular ejection fraction, by estimation, is 50 to 55%. The  left ventricle has low normal function. The left ventricle has no regional  wall motion abnormalities. Left ventricular diastolic parameters are  consistent with Grade I diastolic  dysfunction (impaired relaxation).   2. Right ventricular systolic function is normal. The right ventricular  size is normal.   3. The mitral valve is normal in structure. No evidence of mitral  valve  regurgitation. No evidence of mitral stenosis.   4. The aortic valve is normal in structure. Aortic valve regurgitation is  not visualized. No aortic stenosis is present.   5. The inferior vena cava is normal in size with greater than 50%  respiratory variability, suggesting right atrial pressure of 3 mmHg.   Myovue:  There was no ST segment deviation noted during stress. T wave inversion was noted during stress.  The left ventricular ejection fraction is normal (55-65%).  Nuclear stress EF: 60%.  Defect 1: There is a small defect of mild severity present in the mid anteroseptal, apical septal and apex location.  Defect 2: There is a small defect of mild severity present in the mid anterolateral and apical lateral location.  Findings consistent with ischemia.  This is an intermediate risk study.   1. Small reversible mild perfusion defect in anteroseptum and apex suggestive of ischemia.  2. Small reversible mild perfusion defect in anterolateral wall suggestive of ischemia.  Patient Profile     69 y.o. female with a history of recent COVID-19 pneumonia, hypertension, ITP, and neutropenia who is being seen for chest pain.   Assessment &  Plan    Chest Pain - EKG showed no acute findings. - High-sensitivity troponin minimally elevated and flat at 17 >> 25 >> 25 >> 18.  - Echo showed LVEF of 50-55% with normal wall motion and grade 1 diastolic dysfunction. - myovue with small anteroseptal,anterolateral ischemia may be false positive but given recurrent SSCP for cath today   Hypertension - BP well controlled without any medications. - Continue to monitor.  Recent COVID-19 Pneumonia  - Management per primary team.   For questions or updates, please contact [place] Please consult [email or web address] for contact info under        Signed, Jenkins Rouge, MD  05/24/2019, 7:41 AM    Patient examined chart reviewed. Exam with obese black female Low sats until deep breathing. SSCP with coughing No acute ECG changes Minimal elevation  In troponin with no trend. Lexiscan myovue this am Can be d/c home if non ischemic   Jenkins Rouge MD [facility]

## 2019-05-24 NOTE — Discharge Summary (Signed)
Physician Discharge Summary  Nanetta Wiegman IPJ:[id number] DOB: 11-18-1950 DOA: 05/21/2019  PCP: Kerin Perna, NP  Admit date: 05/21/2019 Discharge date: 05/24/2019  Time spent: 35 minutes  Recommendations for Outpatient Follow-up:  1. Recommend start metoprolol 12.5 twice daily may need to titrate to goal 2. Recommend checking blood pressures ambulatory monitoring and adjust as indicated 3. 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 recent coronavirus discharge 05/09/2019 HTN Presented with chest pain-troponins were elevated CXR showed infiltrate Cardiology consulted Hospital Course:  Her chest pain was described initially as a tightness rated 2 out of 10 high-sensitivity troponins ranged from 17-25 and EF showed this was 50 to 55% on echo without wall motion issues because of high risk patient underwent Lexiscan that showed apical abnormalities-cardiac cath significant for normal but tortuous coronary arteries consistent with hypertensive heart disease and a false positive nuclear stress LVEDP was normal-patient was placed on metoprolol 12.5 twice daily on discharge and instructed to follow-up in the outpatient setting with her primary physician and titrate medications according to blood pressures from ambulatory monitoring standpoint She was stabilized for discharge after cardiac cath on 4/8  Discharge Exam: Vitals:   05/24/19 1328 05/24/19 1355  BP: 118/65 (!) 120/50  Pulse: 84 83  Resp: 12   Temp:    SpO2: 97% 98%    General: Awake alert pleasant  no distress EOMI NCAT slightly anxious S1-S2 no murmur rub or gallop no added sounds no rales no rhonchi Cardiovascular:  Respiratory: Clinically clear no added sound Abdomen soft nontender no rebound No lower extremity edema  Discharge Instructions   Discharge Instructions    Diet - low sodium heart healthy   Complete by: As directed    Discharge instructions   Complete by: As directed    Make sure that you follow-up with a cardiologist in the outpatient setting as you will need close follow-up and discussion of your medical illnesses Make sure that you get your labs checked soon in the outpatient setting to ensure there are no side effects of your medication Take aspirin 81 mg daily on follow-up   Discharge instructions   Complete by: As directed    You were admitted with chest pain but your testing did not reveal any damage to the arteries of your heart instead it was felt that your elevated blood pressure might of been the cause for some of the chest pain that you are having according to thecardiac catheterization instead however you will have to have very good control of your blood pressure so we have  You do not need to follow-up with cardiologist given low risk of your having plaque in your coronary arteries-I have started you on a medication called metoprolol which will help with blood pressure control and you will need to be seen in the clinic in about 2 to 3 weeks with blood pressure readings to ensure that your blood pressures are controlled and dosages of your medications can be adjusted as according to your regular doctor   Increase activity slowly  Complete by: As directed    Increase activity slowly   Complete by: As directed    Increase activity slowly   Complete by: As directed      Allergies as of 05/24/2019      Reactions   Penicillins Rash   Has patient had a PCN reaction causing immediate rash, facial/tongue/throat swelling, SOB or lightheadedness with  hypotension: Yes Has patient had a PCN reaction causing severe rash involving mucus membranes or skin necrosis: No Has patient had a PCN reaction that required hospitalization: No Has patient had a PCN reaction occurring within the last 10 years: No If all of the above answers are "NO", then may proceed with Cephalosporin use.      Medication List    STOP taking these medications   multivitamin with minerals Tabs tablet     TAKE these medications   aspirin 81 MG EC tablet Take 1 tablet (81 mg total) by mouth daily.   Blood Glucose Monitor System w/Device Kit 1 Bag by Does not apply route 3 (three) times daily as needed.   Blood Pressure Monitor Automat Devi 1 kit by Does not apply route 3 (three) times daily.   metoprolol tartrate 25 MG tablet Commonly known as: LOPRESSOR Take 0.5 tablets (12.5 mg total) by mouth 2 (two) times daily.   ondansetron 4 MG tablet Commonly known as: ZOFRAN Take 1 tablet (4 mg total) by mouth every 6 (six) hours as needed for nausea or vomiting.   traMADol 50 MG tablet Commonly known as: ULTRAM Take 1 tablet (50 mg total) by mouth every 6 (six) hours as needed. What changed: reasons to take this      Allergies  Allergen Reactions  . Penicillins Rash    Has patient had a PCN reaction causing immediate rash, facial/tongue/throat swelling, SOB or lightheadedness with hypotension: Yes Has patient had a PCN reaction causing severe rash involving mucus membranes or skin necrosis: No Has patient had a PCN reaction that required hospitalization: No Has patient had a PCN reaction occurring within the last 10 years: No If all of the above answers are "NO", then may proceed with Cephalosporin use.    Follow-up Information    Go to  [facility].   Specialty: Emergency Medicine Why: If symptoms worsen Contact information: [address] [id number] [place] [phone]        Lezlie Octave, PA-C Follow up on 05/29/2019.   Specialty: Family Medicine Why: @ 1:15 pm for hospital follow up appointment.  Contact information: [place] [address] [phone]            The results of significant diagnostics from this hospitalization (including imaging, microbiology, ancillary and laboratory) are listed below for reference.    Significant Diagnostic Studies: DG Lumbar Spine Complete  Result Date: 05/05/2019 CLINICAL DATA:  LOWER back pain radiating into legs. EXAM: LUMBAR SPINE - COMPLETE 4+ VIEW COMPARISON:  04/14/2015 and 05/05/2015 FINDINGS: Degenerative changes are present in the LOWER lumbar spine with facet hypertrophy primarily at L3-4, L4-5, L5-S1. There is 5 millimeters anterolisthesis of L4 on L5. No acute fracture. No lytic or blastic lesions. Visualized bowel gas pattern is nonobstructed. IMPRESSION: 1. Grade 1 anterolisthesis of L4 on L5. 2. No evidence for acute abnormality. Electronically Signed   By: Nolon Nations M.D.   On: 05/05/2019 15:04   CARDIAC CATHETERIZATION  Result Date: 05/24/2019  Angiographically normal, tortuous coronary arteries  LV end  diastolic pressure is normal.  There is no aortic valve stenosis.   Angiographically normal, tortuous coronary arteries consistent with hypertensive heart disease.  False positive nuclear stress test  Normal LVEDP Glenetta Hew, MD  NM Myocar Multi W/Spect W/Wall Motion / EF  Result Date: 05/24/2019  There was no ST segment deviation noted during stress. T wave inversion was noted during stress.  The left ventricular ejection fraction is normal (55-65%).  Nuclear stress EF: 60%.  Defect 1: There is a small defect of mild severity present in the mid anteroseptal, apical septal and apex location.  Defect 2: There is a small defect of mild severity present in the mid anterolateral and apical lateral location.  Findings consistent with ischemia.  This is an  intermediate risk study.  1. Small reversible mild perfusion defect in anteroseptum and apex suggestive of ischemia. 2. Small reversible mild perfusion defect in anterolateral wall suggestive of ischemia.   DG Chest Portable 1 View  Result Date: 05/21/2019 CLINICAL DATA:  COVID positive. EXAM: PORTABLE CHEST 1 VIEW COMPARISON:  05/09/2019 FINDINGS: Airspace opacities have improved in the lungs. Continued mild residual patchy opacities. Mild cardiomegaly. No effusions. No acute bony abnormality. IMPRESSION: Continued but improving patchy bilateral airspace opacities related to COVID. Cardiomegaly. Electronically Signed   By: Rolm Baptise M.D.   On: 05/21/2019 20:36   ECHOCARDIOGRAM COMPLETE  Result Date: 05/22/2019    ECHOCARDIOGRAM REPORT   Patient Name:   VALISSA LYVERS Date of Exam: 05/22/2019 Medical Rec #:  [id number]      Height:       64.0 in Accession #:    [id number]     Weight:       233.0 lb Date of Birth:  20-Aug-1950     BSA:          2.088 m Patient Age:    22 years       BP:           111/72 mmHg Patient Gender: F              HR:           83 bpm. Exam Location:  Inpatient Procedure: 2D Echo, Cardiac Doppler and Color Doppler Indications:    Chest Pain 786.50  History:        Patient has no prior history of Echocardiogram examinations.                 Signs/Symptoms:Chest Pain; Risk Factors:Hypertension and                 Non-Smoker.  Sonographer:    Vickie Epley RDCS Referring Phys: [place]  1. Left ventricular ejection fraction, by estimation, is 50 to 55%. The left ventricle has low normal function. The left ventricle has no regional wall motion abnormalities. Left ventricular diastolic parameters are consistent with Grade I diastolic dysfunction (impaired relaxation).  2. Right ventricular systolic function is normal. The right ventricular size is normal.  3. The mitral valve is normal in structure. No evidence of mitral valve regurgitation. No evidence of mitral  stenosis.  4. The aortic valve is normal in structure. Aortic valve regurgitation is not visualized. No aortic stenosis is present.  5. The inferior vena cava is normal in size with greater than 50% respiratory variability, suggesting right atrial pressure of 3 mmHg. FINDINGS  Left Ventricle: Left ventricular ejection fraction, by estimation, is 50 to 55%. The left ventricle has low normal function. The left  ventricle has no regional wall motion abnormalities. The left ventricular internal cavity size was normal in size. There is no left ventricular hypertrophy. Left ventricular diastolic parameters are consistent with Grade I diastolic dysfunction (impaired relaxation). Right Ventricle: The right ventricular size is normal. No increase in right ventricular wall thickness. Right ventricular systolic function is normal. Left Atrium: Left atrial size was normal in size. Right Atrium: Right atrial size was normal in size. Pericardium: There is no evidence of pericardial effusion. Mitral Valve: The mitral valve is normal in structure. Normal mobility of the mitral valve leaflets. No evidence of mitral valve regurgitation. No evidence of mitral valve stenosis. Tricuspid Valve: The tricuspid valve is normal in structure. Tricuspid valve regurgitation is not demonstrated. No evidence of tricuspid stenosis. Aortic Valve: The aortic valve is normal in structure. Aortic valve regurgitation is not visualized. No aortic stenosis is present. Pulmonic Valve: The pulmonic valve was normal in structure. Pulmonic valve regurgitation is not visualized. No evidence of pulmonic stenosis. Aorta: The aortic root is normal in size and structure. Venous: The inferior vena cava is normal in size with greater than 50% respiratory variability, suggesting right atrial pressure of 3 mmHg. IAS/Shunts: No atrial level shunt detected by color flow Doppler.  LEFT VENTRICLE PLAX 2D LVIDd:         4.00 cm      Diastology LVIDs:         3.00 cm       LV e' lateral:   7.40 cm/s LV PW:         0.90 cm      LV E/e' lateral: 12.1 LV IVS:        0.90 cm      LV e' medial:    7.40 cm/s LVOT diam:     1.80 cm      LV E/e' medial:  12.1 LV SV:         40 LV SV Index:   19 LVOT Area:     2.54 cm  LV Volumes (MOD) LV vol d, MOD A2C: 96.1 ml LV vol d, MOD A4C: 117.0 ml LV vol s, MOD A2C: 56.1 ml LV vol s, MOD A4C: 57.4 ml LV SV MOD A2C:     40.0 ml LV SV MOD A4C:     117.0 ml LV SV MOD BP:      49.2 ml RIGHT VENTRICLE RV S prime:     11.50 cm/s TAPSE (M-mode): 1.8 cm LEFT ATRIUM             Index       RIGHT ATRIUM           Index LA diam:        3.70 cm 1.77 cm/m  RA Area:     13.10 cm LA Vol (A2C):   29.5 ml 14.13 ml/m RA Volume:   28.40 ml  13.60 ml/m LA Vol (A4C):   27.3 ml 13.08 ml/m LA Biplane Vol: 28.4 ml 13.60 ml/m  AORTIC VALVE LVOT Vmax:   82.90 cm/s LVOT Vmean:  59.200 cm/s LVOT VTI:    0.156 m  AORTA Ao Root diam: 2.90 cm MITRAL VALVE MV Area (PHT): 4.96 cm     SHUNTS MV Decel Time: 153 msec     Systemic VTI:  0.16 m MV E velocity: 89.20 cm/s   Systemic Diam: 1.80 cm MV A velocity: 112.00 cm/s MV E/A ratio:  0.80 Mihai Croitoru MD Electronically signed by Sanda Klein MD Signature  Date/Time: 05/22/2019/4:05:21 PM    Final     Microbiology: No results found for this or any previous visit (from the past 240 hour(s)).   Labs: Basic Metabolic Panel: Recent Labs  Lab 05/21/19 2028 05/22/19 0110 05/24/19 0340  NA 136  --  139  K 3.9  --  4.0  CL 101  --  104  CO2 23  --  25  GLUCOSE 116*  --  106*  BUN 11  --  9  CREATININE 0.87 0.83 0.73  CALCIUM 8.8*  --  8.8*  PHOS  --   --  4.3   Liver Function Tests: Recent Labs  Lab 05/22/19 0110 05/24/19 0340  AST 28  --   ALT 26  --   ALKPHOS 53  --   BILITOT 0.6  --   PROT 7.4  --   ALBUMIN 3.2* 3.1*   No results for input(s): LIPASE, AMYLASE in the last 168 hours. No results for input(s): AMMONIA in the last 168 hours. CBC: Recent Labs  Lab 05/21/19 2028 05/22/19 0110  05/24/19 0340  WBC 5.0 5.1 4.0  NEUTROABS 2.7  --  1.2*  HGB 13.0 12.4 12.3  HCT 41.8 39.6 38.5  MCV 93.5 93.2 92.5  PLT 260 273 227   Cardiac Enzymes: No results for input(s): CKTOTAL, CKMB, CKMBINDEX, TROPONINI in the last 168 hours. BNP: BNP (last 3 results) No results for input(s): BNP in the last 8760 hours.  ProBNP (last 3 results) No results for input(s): PROBNP in the last 8760 hours.  CBG: No results for input(s): GLUCAP in the last 168 hours.     Signed:  Nita Sells MD   [facility] 05/24/2019, 3:04 PM

## 2019-05-24 NOTE — Interval H&P Note (Signed)
History and Physical Interval Note:  05/24/2019 12:33 PM  Brandy Stewart  has presented today for surgery, with the diagnosis of chest pain with abnormal nuc.  The various methods of treatment have been discussed with the patient and family. After consideration of risks, benefits and other options for treatment, the patient has consented to  Procedure(s): LEFT HEART CATH AND CORONARY ANGIOGRAPHY (N/A) PERCUTANEOUS CORONARY INTERVENTION   as a surgical intervention.  The patient's history has been reviewed, patient examined, no change in status, stable for surgery.  I have reviewed the patient's chart and labs.  Questions were answered to the patient's satisfaction.    Cath Lab Visit (complete for each Cath Lab visit)  Clinical Evaluation Leading to the Procedure:   ACS: No.  Non-ACS:    Anginal Classification: CCS III  Anti-ischemic medical therapy: Minimal Therapy (1 class of medications)  Non-Invasive Test Results: Intermediate-risk stress test findings: cardiac mortality 1-3%/year  Prior CABG: No previous CABG   Glenetta Hew

## 2019-06-16 ENCOUNTER — Encounter (HOSPITAL_COMMUNITY): Payer: Self-pay | Admitting: Emergency Medicine

## 2019-06-16 ENCOUNTER — Emergency Department (HOSPITAL_COMMUNITY)
Admission: EM | Admit: 2019-06-16 | Discharge: 2019-06-16 | Disposition: A | Discharge status: Home / Self Care | Payer: Medicare Other | Attending: Emergency Medicine | Admitting: Emergency Medicine

## 2019-06-16 ENCOUNTER — Other Ambulatory Visit: Payer: Self-pay

## 2019-06-16 ENCOUNTER — Emergency Department (HOSPITAL_COMMUNITY): Payer: Medicare Other

## 2019-06-16 DIAGNOSIS — Z7982 Long term (current) use of aspirin: Secondary | ICD-10-CM | POA: Insufficient documentation

## 2019-06-16 DIAGNOSIS — R0789 Other chest pain: Secondary | ICD-10-CM | POA: Diagnosis not present

## 2019-06-16 DIAGNOSIS — Z856 Personal history of leukemia: Secondary | ICD-10-CM | POA: Diagnosis not present

## 2019-06-16 DIAGNOSIS — Z79899 Other long term (current) drug therapy: Secondary | ICD-10-CM | POA: Diagnosis not present

## 2019-06-16 DIAGNOSIS — I10 Essential (primary) hypertension: Secondary | ICD-10-CM | POA: Insufficient documentation

## 2019-06-16 DIAGNOSIS — R002 Palpitations: Secondary | ICD-10-CM | POA: Insufficient documentation

## 2019-06-16 LAB — BASIC METABOLIC PANEL
Anion gap: 10 (ref 5–15)
BUN: 7 mg/dL — ABNORMAL LOW (ref 8–23)
CO2: 27 mmol/L (ref 22–32)
Calcium: 9.2 mg/dL (ref 8.9–10.3)
Chloride: 102 mmol/L (ref 98–111)
Creatinine, Ser: 0.83 mg/dL (ref 0.44–1.00)
GFR calc Af Amer: >60 mL/min (ref >60)
GFR calc non Af Amer: >60 mL/min (ref >60)
Glucose, Bld: 150 mg/dL — ABNORMAL HIGH (ref 70–99)
Potassium: 4 mmol/L (ref 3.5–5.1)
Sodium: 139 mmol/L (ref 135–145)

## 2019-06-16 LAB — CBC
HCT: 38.2 % (ref 36.0–46.0)
Hemoglobin: 11.8 g/dL — ABNORMAL LOW (ref 12.0–15.0)
MCH: 29.3 pg (ref 26.0–34.0)
MCHC: 30.9 g/dL (ref 30.0–36.0)
MCV: 94.8 fL (ref 80.0–100.0)
Platelets: 316 10*3/uL (ref 150–400)
RBC: 4.03 MIL/uL (ref 3.87–5.11)
RDW: 14.2 % (ref 11.5–15.5)
WBC: 3 10*3/uL — ABNORMAL LOW (ref 4.0–10.5)
nRBC: 0 % (ref 0.0–0.2)

## 2019-06-16 LAB — TROPONIN I (HIGH SENSITIVITY)
Troponin I (High Sensitivity): 4 ng/L (ref <18)
Troponin I (High Sensitivity): <2 ng/L (ref <18)

## 2019-06-16 MED ORDER — SODIUM CHLORIDE 0.9% FLUSH: 3.0000 mL | Freq: Once | INTRAVENOUS | Status: DC

## 2019-06-16 NOTE — ED Triage Notes (Signed)
Pt to triage via [facility] from home.  Reports intermittent substernal chest pressure since yesterday.  Denies nausea, vomiting, and SOB.  Reports palpitations since this morning.  EMS administered ASA 324mg  and NTG x 1.  Pain decreased from 6-4.  Pt had cardiac cath on 4/8.

## 2019-06-16 NOTE — Discharge Instructions (Addendum)
Your laboratory results were within normal limits today.  Please schedule an appointment with Dr. Ellyn Hack for follow-up on your chest discomfort.  Please continue the medication that you are prescribed by them.  If you experience any shortness of breath, worsening symptoms please return to emergency room.

## 2019-06-16 NOTE — ED Provider Notes (Signed)
[facility] EMERGENCY DEPARTMENT Provider Note   CSN: [id number] Arrival date & time: 06/16/19  1019     History Chief Complaint  Patient presents with  . Chest Pain    Brandy Stewart is a 69 y.o. female.  69 y.o female with a PMH of Bradycardia, HTN, Leukemia, Covid 19 presents to the ED via EMS with a chief complaint of chest soreness and palpitations x yesterday.  According to patient she was seen at [facility], states she had a cardiac cath done, states did not find any blockage.  However, reports since the procedure she has felt soreness, this began it once again yesterday while she was eating cereal, ports a right-sided chest discomfort with no radiation.  Reports she feels her heart beat very fast when these episodes occur.  She has taken some aspirin which she is on daily.  She was also given aspirin by EMS with resolution of her symptoms.  She is asymptomatic at this time aside from the residual right-sided soreness.  Denies any shortness of breath, nausea, vomiting, diaphoresis, other complaints.  The history is provided by the patient.  Chest Pain Pain location:  Substernal area and R chest Pain quality: not aching   Pain radiates to:  Does not radiate Pain severity:  Mild Onset quality:  Sudden Duration:  1 day Timing:  Constant Progression:  Resolved Chronicity:  New Relieved by:  Aspirin Worsened by:  Nothing Associated symptoms: palpitations   Associated symptoms: no back pain, no fever, no headache, no nausea, no shortness of breath and no vomiting   Risk factors: no coronary artery disease, no diabetes mellitus, no hypertension, no prior DVT/PE and no smoking        Past Medical History:  Diagnosis Date  . Acute respiratory failure with hypoxia ([place])   . Bradycardia   . Chest pain   . COVID-19   . History of ITP   . HTN (hypertension)   . Leukemia ([place])   . Pneumonia     Patient Active Problem List   Diagnosis Date Noted    . Abnormal stress test 05/23/2019  . Chest pain with high risk for cardiac etiology 05/22/2019  . COVID-19 05/22/2019  . HTN (hypertension)   . Acute respiratory failure with hypoxia ([place])   . Dysuria 04/12/2018  . Infected dental carries 04/12/2018  . Osteopenia after menopause 04/12/2018  . Breast cancer screening 04/12/2018  . Chronic midline low back pain with bilateral sciatica 04/28/2015  . DDD (degenerative disc disease), lumbosacral 04/08/2015  . Essential hypertension 04/08/2015  . Fatty liver 04/08/2015  . 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 Impaired fasting glucose 04/08/2015  . Lumbosacral spondylosis 04/08/2015  . Sciatica 04/08/2015  . Thrombocytopenia ([place]) 04/08/2015  . Vitamin D deficiency 04/08/2015  . Neutropenia ([place]) 11/15/2014    Past Surgical History:  Procedure Laterality Date  . LEFT HEART CATH AND CORONARY ANGIOGRAPHY N/A 05/24/2019   Procedure: LEFT HEART CATH AND CORONARY ANGIOGRAPHY;  Surgeon: Leonie Man, MD;  Location: [facility];  Service: Cardiovascular;  Laterality: N/A;  . PARTIAL HYSTERECTOMY       OB History   No obstetric history on file.     Family History  Problem Relation Age of Onset  . Diabetes Mother   . Hypertension Mother   . Diabetes Father   . Hypertension Father     Social History   Tobacco Use  . Smoking status: Never Smoker  . Smokeless tobacco: Never  Used  Substance Use Topics  . Alcohol use: No  . Drug use: No    Home Medications Prior to Admission medications   Medication Sig Start Date End Date Taking? Authorizing Provider  aspirin EC 81 MG EC tablet Take 1 tablet (81 mg total) by mouth daily. 05/23/19   Nita Sells, MD  Blood Glucose Monitoring Suppl (BLOOD GLUCOSE MONITOR SYSTEM) w/Device KIT 1 Bag by Does not apply route 3 (three) times daily as needed. 03/01/19   Kerin Perna, NP  Blood Pressure Monitoring (BLOOD PRESSURE MONITOR AUTOMAT) DEVI 1 kit by Does not apply route 3 (three)  times daily. 01/25/19   Kerin Perna, NP  metoprolol tartrate (LOPRESSOR) 25 MG tablet Take 0.5 tablets (12.5 mg total) by mouth 2 (two) times daily. 05/24/19   Nita Sells, MD  ondansetron (ZOFRAN) 4 MG tablet Take 1 tablet (4 mg total) by mouth every 6 (six) hours as needed for nausea or vomiting. Patient not taking: Reported on 03/21/5359 4/43/15   Delora Fuel, MD  traMADol (ULTRAM) 50 MG tablet Take 1 tablet (50 mg total) by mouth every 6 (six) hours as needed. Patient taking differently: Take 50 mg by mouth every 6 (six) hours as needed for moderate pain.  4/00/86   Delora Fuel, MD  cetirizine (ZYRTEC) 10 MG tablet Take 1 tablet (10 mg total) by mouth daily. Patient not taking: Reported on 05/07/2019 05/01/19 05/07/19  Kerin Perna, NP    Allergies    Penicillins  Review of Systems   Review of Systems  Constitutional: Negative for fever.  HENT: Negative for sore throat.   Respiratory: Negative for shortness of breath.   Cardiovascular: Positive for chest pain and palpitations.  Gastrointestinal: Negative for diarrhea, nausea and vomiting.  Genitourinary: Negative for dysuria and flank pain.  Musculoskeletal: Negative for back pain.  Skin: Negative for pallor and wound.  Neurological: Negative for light-headedness and headaches.  All other systems reviewed and are negative.   Physical Exam Updated Vital Signs BP (!) 112/59   Pulse 71   Temp 98.6 F (37 C)   Resp (!) 23   Wt 97.6 kg   SpO2 98%   BMI 36.92 kg/m   Physical Exam Vitals and nursing note reviewed.  Constitutional:      Appearance: She is well-developed. She is not ill-appearing or toxic-appearing.  HENT:     Head: Normocephalic and atraumatic.  Cardiovascular:     Rate and Rhythm: Normal rate.     Pulses:          Radial pulses are 2+ on the right side and 2+ on the left side.       Dorsalis pedis pulses are 2+ on the right side and 2+ on the left side.     Comments: No bilateral  pitting edema. Pulmonary:     Effort: Pulmonary effort is normal.     Breath sounds: No decreased breath sounds or rhonchi.     Comments: Lungs are clear to auscultation without any wheezing, rhonchi, rales. Abdominal:     Comments: Abdomen is soft, nontender to palpation.  Musculoskeletal:     Right lower leg: No tenderness. No edema.     Left lower leg: No tenderness. No edema.     Comments: No calf tenderness bilaterally.  Skin:    General: Skin is warm and dry.  Neurological:     Mental Status: She is alert and oriented to person, place, and time.     ED  Results / Procedures / Treatments   Labs (all labs ordered are listed, but only abnormal results are displayed) Labs Reviewed  BASIC METABOLIC PANEL - Abnormal; Notable for the following components:      Result Value   Glucose, Bld 150 (*)    BUN 7 (*)    All other components within normal limits  CBC - Abnormal; Notable for the following components:   WBC 3.0 (*)    Hemoglobin 11.8 (*)    All other components within normal limits  TROPONIN I (HIGH SENSITIVITY)  TROPONIN I (HIGH SENSITIVITY)    EKG EKG Interpretation  Date/Time:  Saturday Jun 16 2019 10:15:08 EDT Ventricular Rate:  79 PR Interval:  208 QRS Duration: 72 QT Interval:  346 QTC Calculation: 396 R Axis:   7 Text Interpretation: Normal sinus rhythm Nonspecific ST abnormality `similar st apperance noted on prior ecg Confirmed by Lajean Saver [phone]) on 06/16/2019 11:12:09 AM   Radiology DG Chest 2 View  Result Date: 06/16/2019 CLINICAL DATA:  Chest pain EXAM: CHEST - 2 VIEW COMPARISON:  05/21/2019 chest radiograph. FINDINGS: Stable cardiomediastinal silhouette with normal heart size. No pneumothorax. No pleural effusion. Lungs appear clear, with no acute consolidative airspace disease and no pulmonary edema. IMPRESSION: No active cardiopulmonary disease. Electronically Signed   By: Ilona Sorrel M.D.   On: 06/16/2019 11:38    Procedures Procedures  (including critical care time)  Medications Ordered in ED Medications  sodium chloride flush (NS) 0.9 % injection 3 mL (3 mLs Intravenous Not Given 06/16/19 1151)    ED Course  I have reviewed the triage vital signs and the nursing notes.  Pertinent labs & imaging results that were available during my care of the patient were reviewed by me and considered in my medical decision making (see chart for details).    MDM Rules/Calculators/A&P   Patient with a past medical history of hypertension presents to the ED with complaints of right-sided chest soreness which began yesterday while eating cereal.  Patient reports this has been ongoing since her procedure of her left heart cath at the beginning of April 8 by Dr. Ellyn Hack.  She reports aching some aspirin with no improvement in symptoms, was given aspirin x4 by EMS reports resolution in symptoms.  No shortness of breath, nausea, vomiting, prior history of CAD.  According to report of her left heart cath by Dr. Ellyn Hack it showed:  Angiographically normal, tortuous coronary arteries consistent with hypertensive heart disease. False positive nuclear stress test Normal LVEDP  She is asymptomatic on today's visit.  Vitals are within normal limits, pressure is slightly soft at 491 systolic over 54.  She does report taking metoprolol this morning which she is advised to take half in the morning and half at night, states this medication is making her feel somewhat unwell.   Revision of her labs reveal a BMP without any electrolyte abnormality, creatinine levels within normal limits.  Glucose is slightly elevated patient reports just finishing breakfast.  CBC remarkable for leukopenia, hemoglobin slightly decreased.  She did have a COVID-19 infection last month, reports resolution in symptoms from this.  First troponin is negative.  Chest x-ray without any signs of consolidation, pneumothorax or pleural effusion. EKG is normal sinus rhythm.  Due to  patient's recent cardiac cath will consult cardiology for further recommendations.  12:25 PM Spoke to Dr. Meda Coffee of cardiology who recommended outpatient follow up.  12:46 PM patient reassessed by me, currently in no distress, no hypoxia, tachycardia, no  shortness of breath or chest pain.  Although on differential as patient is currently post COVID-19 infection, lower suspicion for pulmonary embolism.  Also criteria applied.  Discussed with patient outpatient follow-up with cardiology.  She is agreeable with plan at this time.  Vitals within normal limits, patient stable for discharge.    Portions of this note were generated with [employer]. Dictation errors may occur despite best attempts at proofreading.  Final Clinical Impression(s) / ED Diagnoses Final diagnoses:  Atypical chest pain    Rx / DC Orders ED Discharge Orders    None       Janeece Fitting, PA-C 06/16/19 1248    Lajean Saver, MD 06/16/19 [phone]

## 2019-06-18 ENCOUNTER — Telehealth: Payer: Self-pay | Admitting: Cardiovascular Disease

## 2019-06-18 NOTE — Telephone Encounter (Signed)
New Message     Pt c/o medication issue:  1. Name of Medication: metoprolol tartrate (LOPRESSOR) 25 MG tablet  2. How are you currently taking this medication (dosage and times per day)? No currently taking   3. Are you having a reaction (difficulty breathing--STAT)? No   4. What is your medication issue? Pts granddaughter is with the pt and is calling saying the pt was told to stop taking this medication while in the hospital. She is wondering if she is suppose to still be off of it or start taking it again?   Please call

## 2019-06-18 NOTE — Telephone Encounter (Signed)
According to discharge summary she is to be on lopressor 12.5 BID.  I do not see anywhere it was stopped.

## 2019-06-18 NOTE — Telephone Encounter (Signed)
Metoprolol is still on patient's medications list. Will have Cecilie Kicks, NP address at visit this week. Informed patient's family daughter (DPR) that if her BP is elevated she can take the metoprolol. Per family, Patient told ED that she did not like taking metoprolol and they told her she did not have to take it. Medication is still on her list. Will forward to Cecilie Kicks NP.

## 2019-06-19 NOTE — Progress Notes (Signed)
Cardiology Office Note   Date:  06/20/2019   ID:  Brandy Stewart, DOB 06-Jan-1951, MRN [id number]  PCP:  [facility]  Cardiologist:  Dr. Johnsie Cancel    Chief Complaint  Patient presents with  . Hospitalization Follow-up      History of Present Illness: Brandy Stewart is a 69 y.o. female who presents for post hospital.   She has history of ITP/Neutropenia. Rx for COVID at [facility] with d/c 05/14/19 Post d/c last 5 days indicates tightness in chest. Non radiating and non exertional Denies pleuritic component. 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 Does all ADL's and walks a lot with no chest pain. Has daughter Brandy Stewart in [place] and son in town . ECG in ER is normal with no acute ischemic changes or pericarditis  Troponin not very remarkable 25->25-> 18   nuc study was intermediate risk  So cardiac cath was planned Echo EF 50-55%, no RWMA  Normal. Cardiac cath with angiographically normal arteries , tortuous arteries consentient with hypertensive heart disease.  Seen in ER 06/16/19 for chest pain. Stable EKG.  Today she tells me she does not feel well after taking BB  Lightheaded.  She has palpitations as well, she notes rapid HR, hard beating.  Has occurred twice.   Also noted HR at 43 SB with 1st degree AV Block.   She was discharged with 12.5 BID of lopressor.   Some Rt sided chest pain, may be muscular skeletal.   BP is well controlled today.  Her granddaughter is with her.    Past Medical History:  Diagnosis Date  . Acute respiratory failure with hypoxia ([place])   . Bradycardia   . Chest pain   . COVID-19   . History of ITP   . HTN (hypertension)   . Leukemia ([place])   . Pneumonia     Past Surgical History:  Procedure Laterality Date  . LEFT HEART CATH AND CORONARY ANGIOGRAPHY N/A 05/24/2019   Procedure: LEFT HEART CATH AND CORONARY  ANGIOGRAPHY;  Surgeon: Leonie Man, MD;  Location: [facility];  Service: Cardiovascular;  Laterality: N/A;  . PARTIAL HYSTERECTOMY       Current Outpatient Medications  Medication Sig Dispense Refill  . aspirin EC 81 MG EC tablet Take 1 tablet (81 mg total) by mouth daily. 30 tablet 11  . Blood Glucose Monitoring Suppl (BLOOD GLUCOSE MONITOR SYSTEM) w/Device KIT 1 Bag by Does not apply route 3 (three) times daily as needed. 1 kit 0  . Blood Pressure Monitoring (BLOOD PRESSURE MONITOR AUTOMAT) DEVI 1 kit by Does not apply route 3 (three) times daily. 1 each 0  . ondansetron (ZOFRAN) 4 MG tablet Take 1 tablet (4 mg total) by mouth every 6 (six) hours as needed for nausea or vomiting. 12 tablet 0  . traMADol (ULTRAM) 50 MG tablet Take 1 tablet (50 mg total) by mouth every 6 (six) hours as needed. 15 tablet 0   No current facility-administered medications for this visit.    Allergies:   Penicillins    Social History:  The patient  reports that she has never smoked. She has never used smokeless tobacco. She reports that she does not drink alcohol or use drugs.   Family History:  The patient's family history includes Diabetes in her father and mother; Hypertension in her father and  mother.    ROS:  General:no colds or fevers, no weight changes Skin:no rashes or ulcers HEENT:no blurred vision, no congestion CV:see HPI PUL:see HPI GI:no diarrhea constipation or melena, no indigestion GU:no hematuria, no dysuria MS:no joint pain, no claudication Neuro:no syncope, no lightheadedness Endo:no diabetes, no thyroid disease in hospital TSH 1.975   Wt Readings from Last 3 Encounters:  06/20/19 224 lb 8 oz (101.8 kg)  06/16/19 215 lb 1 oz (97.6 kg)  05/24/19 223 lb 4.8 oz (101.3 kg)     PHYSICAL EXAM: VS:  BP 110/66   Pulse 80   Ht 5' 4"  (1.626 m)   Wt 224 lb 8 oz (101.8 kg)   SpO2 96%   BMI 38.54 kg/m  , BMI Body mass index is 38.54 kg/m. General:Pleasant affect, NAD  Skin:Warm and dry, brisk capillary refill HEENT:normocephalic, sclera clear, mucus membranes moist Neck:supple, no JVD, no bruits  Heart:S1S2 RRR without murmur, gallup, rub or click, Rt wrist cath site without hematoma, 2+ radial.   Lungs:clear without rales, rhonchi, or wheezes DDU:KGUR, non tender, + BS, do not palpate liver spleen or masses Ext:no lower ext edema, 2+ pedal pulses, 2+ radial pulses Neuro:alert and oriented, MAE, follows commands, + facial symmetry    EKG:  EKG is NOT ordered today.    Recent Labs: 05/22/2019: ALT 26; TSH 1.975 06/16/2019: BUN 7; Creatinine, Ser 0.83; Hemoglobin 11.8; Platelets 316; Potassium 4.0; Sodium 139    Lipid Panel    Component Value Date/Time   CHOL 111 05/22/2019 0427   CHOL 149 10/25/2018 0934   TRIG 85 05/22/2019 0427   HDL 38 (L) 05/22/2019 0427   HDL 51 10/25/2018 0934   CHOLHDL 2.9 05/22/2019 0427   VLDL 17 05/22/2019 0427   LDLCALC 56 05/22/2019 0427   LDLCALC 82 10/25/2018 0934       Other studies Reviewed: Additional studies/ records that were reviewed today include: . Cardiac cath 05/24/19  Angiographically normal, tortuous coronary arteries  LV end diastolic pressure is normal.  There is no aortic valve stenosis.    Angiographically normal, tortuous coronary arteries consistent with hypertensive heart disease.  False positive nuclear stress test  Normal LVEDP   Echo 05/22/19 IMPRESSIONS    1. Left ventricular ejection fraction, by estimation, is 50 to 55%. The  left ventricle has low normal function. The left ventricle has no regional  wall motion abnormalities. Left ventricular diastolic parameters are  consistent with Grade I diastolic  dysfunction (impaired relaxation).  2. Right ventricular systolic function is normal. The right ventricular  size is normal.  3. The mitral valve is normal in structure. No evidence of mitral valve  regurgitation. No evidence of mitral stenosis.  4. The aortic  valve is normal in structure. Aortic valve regurgitation is  not visualized. No aortic stenosis is present.  5. The inferior vena cava is normal in size with greater than 50%  respiratory variability, suggesting right atrial pressure of 3 mmHg.   FINDINGS  Left Ventricle: Left ventricular ejection fraction, by estimation, is 50  to 55%. The left ventricle has low normal function. The left ventricle has  no regional wall motion abnormalities. The left ventricular internal  cavity size was normal in size.  There is no left ventricular hypertrophy. Left ventricular diastolic  parameters are consistent with Grade I diastolic dysfunction (impaired  relaxation).   Right Ventricle: The right ventricular size is normal. No increase in  right ventricular wall thickness. Right ventricular systolic function is  normal.   Left Atrium: Left atrial size was normal in size.   Right Atrium: Right atrial size was normal in size.   Pericardium: There is no evidence of pericardial effusion.   Mitral Valve: The mitral valve is normal in structure. Normal mobility of  the mitral valve leaflets. No evidence of mitral valve regurgitation. No  evidence of mitral valve stenosis.   Tricuspid Valve: The tricuspid valve is normal in structure. Tricuspid  valve regurgitation is not demonstrated. No evidence of tricuspid  stenosis.   Aortic Valve: The aortic valve is normal in structure. Aortic valve  regurgitation is not visualized. No aortic stenosis is present.   Pulmonic Valve: The pulmonic valve was normal in structure. Pulmonic valve  regurgitation is not visualized. No evidence of pulmonic stenosis.   Aorta: The aortic root is normal in size and structure.   Venous: The inferior vena cava is normal in size with greater than 50%  respiratory variability, suggesting right atrial pressure of 3 mmHg.   IAS/Shunts: No atrial level shunt detected by color flow Doppler.   ASSESSMENT AND PLAN:  1.   Chest pain with abnormal nuc which led to cardiac cath and patent coronary arteries.  Reviewed hospital notes and labs and from [facility] notes as well and EKG notes.  She has some Rt sided chest pain and was seen back in ER though it seems this is related to rapid HR.   Neg troponin, will have her wear Live zio patch to eval arrhythmia.  Follow up in 1 month unless issues of arrhythmia but do want to rule out a fib.  2.  Medication intolerance to BB, on lopressor 12.5 BID but when she takes she is lightheaded,  Concern for bradycardia with hx per records from [facility]  Will have her hold BB for now and if recurrent rapid HR add dilt.   3.  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  If rapid HR would use Cardizem with normal EF and G1DD.    4.  HTN lower end today, will stop BB.  She will check BP at home and if >140-150/90 for 2 days she will call us and I will begin dilt.   5.  Hx ITP and leukemia also COVID in March.    Current medicines are reviewed with the patient today.  The patient Has no concerns regarding medicines.  The following changes have been made:  See above Labs/ tests ordered today include:see above  Disposition:   FU:  see above  Signed, Cecilie Kicks, NP  06/20/2019 12:09 PM    [address], [place], [place], [place] Phone: [phone]; Fax: [phone]

## 2019-06-20 ENCOUNTER — Encounter: Payer: Self-pay | Admitting: Cardiology

## 2019-06-20 ENCOUNTER — Telehealth: Payer: Self-pay | Admitting: Radiology

## 2019-06-20 ENCOUNTER — Other Ambulatory Visit: Payer: Self-pay

## 2019-06-20 ENCOUNTER — Ambulatory Visit (INDEPENDENT_AMBULATORY_CARE_PROVIDER_SITE_OTHER): Payer: Medicare Other | Admitting: Cardiology

## 2019-06-20 VITALS — BP 110/66 | HR 80 | Ht 64.0 in | Wt 224.5 lb

## 2019-06-20 DIAGNOSIS — R000 Tachycardia, unspecified: Secondary | ICD-10-CM | POA: Diagnosis not present

## 2019-06-20 DIAGNOSIS — R0789 Other chest pain: Secondary | ICD-10-CM

## 2019-06-20 DIAGNOSIS — I495 Sick sinus syndrome: Secondary | ICD-10-CM

## 2019-06-20 DIAGNOSIS — I10 Essential (primary) hypertension: Secondary | ICD-10-CM

## 2019-06-20 NOTE — Patient Instructions (Addendum)
Medication Instructions:  Your physician has recommended you make the following change in your medication:  1.  STOP the Metoprolol   *If you need a refill on your cardiac medications before your next appointment, please call your pharmacy*   Lab Work: None ordered  If you have labs (blood work) drawn today and your tests are completely normal, you will receive your results only by: Marland Kitchen MyChart Message (if you have MyChart) OR . A paper copy in the mail If you have any lab test that is abnormal or we need to change your treatment, we will call you to review the results.   Testing/Procedures:  [facility]- Long Term Monitor Instructions   Your physician has requested you wear your ZIO patch monitor 14 days.   This is a single patch monitor.  [employer] supplies one patch monitor per enrollment.  Additional stickers are not available.   Please do not apply patch if you will be having a Nuclear Stress Test, Echocardiogram, Cardiac CT, MRI, or Chest Xray during the time frame you would be wearing the monitor. The patch cannot be worn during these tests.  You cannot remove and re-apply the ZIO XT patch monitor.   Your ZIO patch monitor will be sent USPS Priority mail from [facility] directly to your home address. The monitor may also be mailed to a PO BOX if home delivery is not available.   It may take 3-5 days to receive your monitor after you have been enrolled.   Once you have received you monitor, please review enclosed instructions.  Your monitor has already been registered assigning a specific monitor serial # to you.   Applying the monitor   Shave hair from upper left chest.   Hold abrader disc by orange tab.  Rub abrader in 40 strokes over left upper chest as indicated in your monitor instructions.   Clean area with 4 enclosed alcohol pads .  Use all pads to assure are is cleaned thoroughly.  Let dry.   Apply patch as indicated in monitor instructions.  Patch will be place  under collarbone on left side of chest with arrow pointing upward.   Rub patch adhesive wings for 2 minutes.Remove white label marked "1".  Remove white label marked "2".  Rub patch adhesive wings for 2 additional minutes.   While looking in a mirror, press and release button in center of patch.  A small green light will flash 3-4 times .  This will be your only indicator the monitor has been turned on.     Do not shower for the first 24 hours.  You may shower after the first 24 hours.   Press button if you feel a symptom. You will hear a small click.  Record Date, Time and Symptom in the Patient Log Book.   When you are ready to remove patch, follow instructions on last 2 pages of Patient Log Book.  Stick patch monitor onto last page of Patient Log Book.   Place Patient Log Book in [place] box.  Use locking tab on box and tape box closed securely.  The [employer] has [employer] on it.  Please place in mailbox as soon as possible.  Your physician should have your test results approximately 7 days after the monitor has been mailed back to [facility].   Call [place] at [phone] if you have questions regarding your ZIO XT patch monitor.  Call them immediately if you see an  orange light blinking on your monitor.   If your monitor falls off in less than 4 days contact our Monitor department at [phone].  If your monitor becomes loose or falls off after 4 days call [employer] at [phone] for suggestions on securing your monitor.     Follow-Up: At [facility], you and your health needs are our priority.  As part of our continuing mission to provide you with exceptional heart care, we have created designated Provider Care Teams.  These Care Teams include your primary Cardiologist (physician) and Advanced Practice Providers (APPs -  Physician Assistants and Nurse Practitioners) who all work together to provide you with the care you need, when you need  it.  We recommend signing up for the patient portal called "MyChart".  Sign up information is provided on this After Visit Summary.  MyChart is used to connect with patients for Virtual Visits (Telemedicine).  Patients are able to view lab/test results, encounter notes, upcoming appointments, etc.  Non-urgent messages can be sent to your provider as well.   To learn more about what you can do with MyChart, go to [email or web address].    Your next appointment:   1 month(s)  07/20/2019 11:45 arrive at 11:30 for registration   The format for your next appointment:   In Person  Provider:   You may see Jenkins Rouge, MD or one of the following Advanced Practice Providers on your designated Care Team:    Truitt Merle, NP  Cecilie Kicks, NP  Kathyrn Drown, NP    Other Instructions Monitor your blood pressure. If you notice it running over 140-150/90 2 days in a row, call our ofifce

## 2019-06-20 NOTE — Telephone Encounter (Signed)
Patient saw provider today. Metoprolol was stopped.

## 2019-06-20 NOTE — Telephone Encounter (Signed)
Enrolled patient for a 14 day Zio AT monitor to be mailed to patients home.  

## 2019-07-04 ENCOUNTER — Encounter (INDEPENDENT_AMBULATORY_CARE_PROVIDER_SITE_OTHER): Payer: Medicare Other

## 2019-07-04 DIAGNOSIS — R000 Tachycardia, unspecified: Secondary | ICD-10-CM | POA: Diagnosis not present

## 2019-07-16 NOTE — Progress Notes (Signed)
Cardiology Office Note   Date:  07/20/2019   ID:  Brandy Stewart, DOB 11/11/1950, MRN [id number]  PCP:  [facility]  Cardiologist: Dr. Johnsie Cancel, MD   Chief Complaint  Patient presents with  . Follow-up      History of Present Illness: Brandy Stewart is a 69 y.o. female who presents for one month follow up, seen for Dr. Johnsie Cancel.   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 5 days after discharge, she began having chest tightness which was nonradiating and nonexertional with brief palpitations.  There was no pleuritic features. CXR in the ED showed improving patchy airspace disease with normal cardiac.  EKG was normal with no acute ischemic changes or pericarditis.  Troponins were unremarkable at 25>>25>>18.  Nuclear study was performed which was found to be intermediate therefore cardiac cath was planned.  Echocardiogram showed an EF of 50 to 55% with no regional wall motion abnormalities.  LHC performed 05/24/2019 which showed normal coronary arteries however torturous with hypertensive heart disease.  She was seen once again 06/16/2019 in the ED for chest pain with a stable EKG.  In follow-up she had complaints of palpitations as well as lightheadedness, not tolerating beta-blocker therapy.  HR at 43 bpm with first-degree AV block on ED discharge>> discharged with metoprolol tartrate 12.5 mg p.o. twice daily.  Today she is here alone for follow up and reports that she has had no further palpitations.  She reports she is been doing well since last OV.  Heart rate is improved today.  She states she mailed her ZIO monitor in Wednesday therefore results should be available mid to end of next week.  We discussed that our office will call and let her know of the results.  She denies shortness of breath, LE edema, PND, orthopnea, dizziness, fatigue or syncope.  Overall is doing very  well.  Past Medical History:  Diagnosis Date  . Acute respiratory failure with hypoxia ([place])   . Bradycardia   . Chest pain   . COVID-19   . History of ITP   . HTN (hypertension)   . Leukemia ([place])   . Pneumonia     Past Surgical History:  Procedure Laterality Date  . LEFT HEART CATH AND CORONARY ANGIOGRAPHY N/A 05/24/2019   Procedure: LEFT HEART CATH AND CORONARY ANGIOGRAPHY;  Surgeon: Leonie Man, MD;  Location: [facility];  Service: Cardiovascular;  Laterality: N/A;  . PARTIAL HYSTERECTOMY       Current Outpatient Medications  Medication Sig Dispense Refill  . acetaminophen (TYLENOL) 500 MG tablet Take 500 mg by mouth as needed.     No current facility-administered medications for this visit.    Allergies:   Penicillins    Social History:  The patient  reports that she has never smoked. She has never used smokeless tobacco. She reports that she does not drink alcohol or use drugs.   Family History:  The patient's family history includes Diabetes in her father and mother; Hypertension in her father and mother.    ROS:  Please see the history of present illness.   Otherwise, review of systems are positive for none.   All other systems are reviewed and negative.    PHYSICAL EXAM: VS:  BP 110/68   Pulse 75   Ht 5\' 4"  (1.626 m)   Wt 222 lb (100.7 kg)   SpO2 98%  BMI 38.11 kg/m  , BMI Body mass index is 38.11 kg/m.   General: Well developed, well nourished, NAD Neck: Negative for carotid bruits. No JVD Lungs:Clear to ausculation bilaterally. No wheezes, rales, or rhonchi. Breathing is unlabored. Cardiovascular: RRR with S1 S2. No murmur Extremities: No edema. Radial pulses 2+ bilaterally Neuro: Alert and oriented. No focal deficits. No facial asymmetry. MAE spontaneously. Psych: Responds to questions appropriately with normal affect.     EKG:  EKG is not ordered today.  Recent Labs: 05/22/2019: ALT 26; TSH 1.975 06/16/2019: BUN 7; Creatinine, Ser  0.83; Hemoglobin 11.8; Platelets 316; Potassium 4.0; Sodium 139   Lipid Panel    Component Value Date/Time   CHOL 111 05/22/2019 0427   CHOL 149 10/25/2018 0934   TRIG 85 05/22/2019 0427   HDL 38 (L) 05/22/2019 0427   HDL 51 10/25/2018 0934   CHOLHDL 2.9 05/22/2019 0427   VLDL 17 05/22/2019 0427   LDLCALC 56 05/22/2019 0427   LDLCALC 82 10/25/2018 0934      Wt Readings from Last 3 Encounters:  07/20/19 222 lb (100.7 kg)  06/20/19 224 lb 8 oz (101.8 kg)  06/16/19 215 lb 1 oz (97.6 kg)     Other studies Reviewed: Additional studies/ records that were reviewed today include:  Review of the above records demonstrates:  Cardiac cath 05/24/19  Angiographically normal, tortuous coronary arteries  LV end diastolic pressure is normal.  There is no aortic valve stenosis.   Angiographically normal, tortuous coronary arteries consistent with hypertensive heart disease.  False positive nuclear stress test  Normal LVEDP   Echo 05/22/19 IMPRESSIONS    1. Left ventricular ejection fraction, by estimation, is 50 to 55%. The  left ventricle has low normal function. The left ventricle has no regional  wall motion abnormalities. Left ventricular diastolic parameters are  consistent with Grade I diastolic  dysfunction (impaired relaxation).  2. Right ventricular systolic function is normal. The right ventricular  size is normal.  3. The mitral valve is normal in structure. No evidence of mitral valve  regurgitation. No evidence of mitral stenosis.  4. The aortic valve is normal in structure. Aortic valve regurgitation is  not visualized. No aortic stenosis is present.  5. The inferior vena cava is normal in size with greater than 50%  respiratory variability, suggesting right atrial pressure of 3 mmHg.   ASSESSMENT AND PLAN:  1.  Chest pain: -Recent nuclear stress test was abnormal however LHC performed 05/24/2019 with normal coronary arteries and hypertensive heart  disease>> returned 06/16/2019 with right-sided chest pain felt to be related to rapid heart rate -Zio monitor to evaluate for arrhythmias/rule out atrial fibrillation>>returned 07/18/19 therefore results should be available next week  -Denies recurrent symptoms   2.  Bradycardia: -Found on EKG from [facility]>> placed on Lopressor 12.5 twice daily which was held at last OV -If recurrent palpitations we will add diltiazem>>no recurrent symptoms   3.  Tachycardia/palpitations: -One episode during [facility] hospitalization with heart rate at 141 bpm>>> concern for tachybradycardia syndrome -If recurrence, consider diltiazem given normal EF  4.  HTN: -Stable, 110/68 -Beta-blocker stopped at last OV -Can start diltiazem as above if recurrent symptoms     5.  History of ITP/leukemia: -Followed by primary  6. S/p COVID-19: -Diagnosed 04/2019 at [facility]   Current medicines are reviewed at length with the patient today.  The patient does not have concerns regarding medicines.  The following changes have been made:  no change  Labs/ tests  ordered today include: None  No orders of the defined types were placed in this encounter.    Disposition:   FU with Dr. Johnsie Cancel in 6 month  Signed, Kathyrn Drown, NP  07/20/2019 11:57 AM    [address], [place], [place]  [address] Phone: [phone]; Fax: [phone]

## 2019-07-20 ENCOUNTER — Encounter: Payer: Self-pay | Admitting: Cardiology

## 2019-07-20 ENCOUNTER — Ambulatory Visit (INDEPENDENT_AMBULATORY_CARE_PROVIDER_SITE_OTHER): Payer: Medicare Other | Admitting: Cardiology

## 2019-07-20 ENCOUNTER — Other Ambulatory Visit: Payer: Self-pay

## 2019-07-20 VITALS — BP 110/68 | HR 75 | Ht 64.0 in | Wt 222.0 lb

## 2019-07-20 DIAGNOSIS — I10 Essential (primary) hypertension: Secondary | ICD-10-CM

## 2019-07-20 NOTE — Patient Instructions (Signed)
Medication Instructions:   Your physician recommends that you continue on your current medications as directed. Please refer to the Current Medication list given to you today.  *If you need a refill on your cardiac medications before your next appointment, please call your pharmacy*  Lab Work:  None ordered today  If you have labs (blood work) drawn today and your tests are completely normal, you will receive your results only by: Marland Kitchen MyChart Message (if you have MyChart) OR . A paper copy in the mail If you have any lab test that is abnormal or we need to change your treatment, we will call you to review the results.  Testing/Procedures:  None ordered today  Follow-Up:  On 01/21/20 at 8:00AM with Jenkins Rouge, MD

## 2019-08-03 ENCOUNTER — Telehealth: Payer: Self-pay | Admitting: Cardiovascular Disease

## 2019-08-03 NOTE — Telephone Encounter (Signed)
Transferred call to Jennifer.

## 2019-09-30 ENCOUNTER — Other Ambulatory Visit: Payer: Self-pay

## 2019-09-30 ENCOUNTER — Encounter (HOSPITAL_COMMUNITY): Payer: Self-pay

## 2019-09-30 ENCOUNTER — Ambulatory Visit (HOSPITAL_COMMUNITY)
Admission: EM | Admit: 2019-09-30 | Discharge: 2019-09-30 | Disposition: A | Discharge status: Home / Self Care | Payer: Medicare Other | Attending: Urgent Care | Admitting: Urgent Care

## 2019-09-30 DIAGNOSIS — I10 Essential (primary) hypertension: Secondary | ICD-10-CM | POA: Insufficient documentation

## 2019-09-30 DIAGNOSIS — R0982 Postnasal drip: Secondary | ICD-10-CM | POA: Insufficient documentation

## 2019-09-30 DIAGNOSIS — J3089 Other allergic rhinitis: Secondary | ICD-10-CM

## 2019-09-30 DIAGNOSIS — J309 Allergic rhinitis, unspecified: Secondary | ICD-10-CM | POA: Diagnosis not present

## 2019-09-30 DIAGNOSIS — R519 Headache, unspecified: Secondary | ICD-10-CM | POA: Diagnosis not present

## 2019-09-30 DIAGNOSIS — R070 Pain in throat: Secondary | ICD-10-CM | POA: Diagnosis present

## 2019-09-30 DIAGNOSIS — Z20822 Contact with and (suspected) exposure to covid-19: Secondary | ICD-10-CM | POA: Insufficient documentation

## 2019-09-30 MED ORDER — CETIRIZINE HCL 10 MG PO TABS: 10.0000 mg | ORAL_TABLET | Freq: Every day | ORAL | 0 refills | Status: DC

## 2019-09-30 MED ORDER — PREDNISONE 20 MG PO TABS
ORAL_TABLET | ORAL | 0 refills | Status: DC
Start: 2019-09-30 — End: 2020-03-12

## 2019-09-30 NOTE — ED Provider Notes (Signed)
[facility]   MRN: [id number] DOB: [birth year]  Subjective:   Brandy Stewart is a 69 y.o. female presenting for 1 month history of persistent sinus congestion, postnasal drainage causing throat pain and left-sided headache. Patient has seen her primary care doctor twice and had a negative rapid strep test. She was prescribed doxycycline given her penicillin allergy for sinusitis. She was also prescribed prednisone course of 654321 taper. She was unable to tolerate this after the first 2 doses and ended up stopping this due to feeling some heart racing. She did finish out her doxycycline and states that she has not felt any significant change. Denies fever, coughing, chest pain, shortness of breath, loss of sense of taste smell, belly pain, rash. Patient denies history of diabetes. She does have a history of high blood pressure but is not on any medication for this. She has had both her Covid vaccinations. Denies weakness, falls, trauma, confusion, photophobia, vision change, history of stroke. Denies smoking history, has a history of allergies but is not taking anything for this.  No current facility-administered medications for this encounter.  Current Outpatient Medications:  .  acetaminophen (TYLENOL) 500 MG tablet, Take 500 mg by mouth as needed., Disp: , Rfl:    Allergies  Allergen Reactions  . Penicillins Rash    Has patient had a PCN reaction causing immediate rash, facial/tongue/throat swelling, SOB or lightheadedness with hypotension: Yes Has patient had a PCN reaction causing severe rash involving mucus membranes or skin necrosis: No Has patient had a PCN reaction that required hospitalization: No Has patient had a PCN reaction occurring within the last 10 years: No If all of the above answers are "NO", then may proceed with Cephalosporin use.     Past Medical History:  Diagnosis Date  . Acute respiratory failure with hypoxia ([place])   . Bradycardia   . Chest pain   .  COVID-19   . History of ITP   . HTN (hypertension)   . Leukemia ([place])   . Pneumonia      Past Surgical History:  Procedure Laterality Date  . LEFT HEART CATH AND CORONARY ANGIOGRAPHY N/A 05/24/2019   Procedure: LEFT HEART CATH AND CORONARY ANGIOGRAPHY;  Surgeon: Leonie Man, MD;  Location: [facility];  Service: Cardiovascular;  Laterality: N/A;  . PARTIAL HYSTERECTOMY      Family History  Problem Relation Age of Onset  . Diabetes Mother   . Hypertension Mother   . Diabetes Father   . Hypertension Father     Social History   Tobacco Use  . Smoking status: Never Smoker  . Smokeless tobacco: Never Used  Vaping Use  . Vaping Use: Never used  Substance Use Topics  . Alcohol use: No  . Drug use: No    ROS   Objective:   Vitals: BP (!) 141/81   Pulse 82   Temp 98.4 F (36.9 C) (Oral)   Resp 16   Ht 5\' 4"  (1.626 m)   Wt 223 lb (101.2 kg)   SpO2 99%   BMI 38.28 kg/m   Physical Exam Constitutional:      General: She is not in acute distress.    Appearance: Normal appearance. She is well-developed. She is not ill-appearing, toxic-appearing or diaphoretic.  HENT:     Head: Normocephalic and atraumatic.     Right Ear: Ear canal and external ear normal. There is no impacted cerumen.     Left Ear: Ear canal and external ear  normal. There is no impacted cerumen.     Ears:     Comments: TMs opacified bilaterally but otherwise normal.    Nose: Congestion present. No rhinorrhea.     Comments: Nasal mucosa boggy and edematous.    Mouth/Throat:     Mouth: Mucous membranes are moist.     Pharynx: No oropharyngeal exudate or posterior oropharyngeal erythema.  Eyes:     General: No scleral icterus.       Right eye: No discharge.        Left eye: No discharge.     Extraocular Movements: Extraocular movements intact.     Conjunctiva/sclera: Conjunctivae normal.     Pupils: Pupils are equal, round, and reactive to light.  Neck:     Vascular: No carotid bruit.   Cardiovascular:     Rate and Rhythm: Normal rate and regular rhythm.     Pulses: Normal pulses.     Heart sounds: Normal heart sounds. No murmur heard.  No friction rub. No gallop.   Pulmonary:     Effort: Pulmonary effort is normal. No respiratory distress.     Breath sounds: Normal breath sounds. No stridor. No wheezing, rhonchi or rales.  Musculoskeletal:     Cervical back: Normal range of motion and neck supple.     Right lower leg: No edema.     Left lower leg: No edema.  Skin:    General: Skin is warm and dry.     Findings: No rash.  Neurological:     General: No focal deficit present.     Mental Status: She is alert and oriented to person, place, and time.     Cranial Nerves: No cranial nerve deficit.     Motor: No weakness.     Coordination: Coordination normal.     Gait: Gait normal.     Deep Tendon Reflexes: Reflexes normal.     Comments: Negative Romberg and pronator drift.  Psychiatric:        Mood and Affect: Mood normal.        Behavior: Behavior normal.        Thought Content: Thought content normal.        Judgment: Judgment normal.     Assessment and Plan :   PDMP not reviewed this encounter.  1. Allergic rhinitis due to other allergic trigger, unspecified seasonality   2. Left-sided headache   3. Post-nasal drainage   4. Throat pain     Patient has no signs of intracranial process, stroke. Discussed this extensively with her and given the timeframe of 1 month symptoms recommended holding off on ER visit and CT scan today. Recommended follow-up with her PCP, consideration for outpatient CT imaging. For now, since patient did not finish the prednisone course, will use a lighter dose of this for 5 days. Recommended she start Zyrtec daily as well. Maintain strict ER precautions. Counseled patient on potential for adverse effects with medications prescribed today, patient verbalized understanding.    Brandy Stewart, [place] 09/30/19 1123

## 2019-09-30 NOTE — Discharge Instructions (Signed)
At this time you have no signs of stroke on exam. Given your timeline of having these symptoms for ~1 month, I do not believe it is necessary to go to the ER today for a brain scan. But you definitely need follow up with your PCP and consideration for this as an an outpatient.

## 2019-09-30 NOTE — ED Triage Notes (Signed)
Pt c/o sore throatx2 wks. Pt states went to dr and was prescribed atx, but it's not helping. Pt c/o sharp pain in head and neck pain since yesterday. Pt denies photo sensitivity and nausea. Pt states has pain in left eye. Pt denies vision changes.

## 2019-10-01 LAB — SARS CORONAVIRUS 2 (TAT 6-24 HRS): SARS Coronavirus 2: NEGATIVE

## 2019-11-01 ENCOUNTER — Ambulatory Visit (INDEPENDENT_AMBULATORY_CARE_PROVIDER_SITE_OTHER): Payer: Medicare Other | Admitting: Primary Care

## 2020-01-16 NOTE — Progress Notes (Deleted)
CARDIOLOGY CONSULT NOTE       Patient ID: Brandy Stewart MRN: [id number] DOB/AGE: [birth year] 69 y.o.  Primary Physician: [facility] Primary Cardiologist: Johnsie Cancel  Active Problems:   * No active hospital problems. *   HPI:  69 y.o. first seen at [facility] 05/21/19 for chest pain. History of history of SSCP during COVID infection She is a retired Art gallery manager. Has daughter [name] in [place] and son in town. She had a false positive myovue and cath 05/24/19 showed no CAD with normal EF and LVEDP Echo 05/22/19 showed EF 50-55% with no valve disease Has had palpitations Rx with beta blocker Caused bradycardia with HR;s 40's and first degree block Zio monitor completed 07/04/19 no significant arrhythmia   ***  ROS All other systems reviewed and negative except as noted above  Past Medical History:  Diagnosis Date  . Acute respiratory failure with hypoxia ([place])   . Bradycardia   . Chest pain   . COVID-19   . History of ITP   . HTN (hypertension)   . Leukemia ([place])   . Pneumonia     Family History  Problem Relation Age of Onset  . Diabetes Mother   . Hypertension Mother   . Diabetes Father   . Hypertension Father     Social History   Socioeconomic History  . Marital status: Widowed    Spouse name: Not on file  . Number of children: Not on file  . Years of education: Not on file  . Highest education level: Not on file  Occupational History  . Not on file  Tobacco Use  . Smoking status: Never Smoker  . Smokeless tobacco: Never Used  Vaping Use  . Vaping Use: Never used  Substance and Sexual Activity  . Alcohol use: No  . Drug use: No  . Sexual activity: Yes    Comment: recently with partner of 1 year  Other Topics Concern  . Not on file  Social History Narrative  . Not on file   Social Determinants of Health   Financial Resource Strain:   . Difficulty of Paying Living Expenses: Not on file  Food Insecurity:   . Worried About Charity fundraiser in  the Last Year: Not on file  . Ran Out of Food in the Last Year: Not on file  Transportation Needs:   . Lack of Transportation (Medical): Not on file  . Lack of Transportation (Non-Medical): Not on file  Physical Activity:   . Days of Exercise per Week: Not on file  . Minutes of Exercise per Session: Not on file  Stress:   . Feeling of Stress : Not on file  Social Connections:   . Frequency of Communication with Friends and Family: Not on file  . Frequency of Social Gatherings with Friends and Family: Not on file  . Attends Religious Services: Not on file  . Active Member of Clubs or Organizations: Not on file  . Attends Archivist Meetings: Not on file  . Marital Status: Not on file  Intimate Partner Violence:   . Fear of Current or Ex-Partner: Not on file  . Emotionally Abused: Not on file  . Physically Abused: Not on file  . Sexually Abused: Not on file    Past Surgical History:  Procedure Laterality Date  . LEFT HEART CATH AND CORONARY ANGIOGRAPHY N/A 05/24/2019   Procedure: LEFT HEART CATH AND CORONARY ANGIOGRAPHY;  Surgeon: Leonie Man, MD;  Location: [place]  [facility];  Service: Cardiovascular;  Laterality: N/A;  . PARTIAL HYSTERECTOMY        Current Outpatient Medications:  .  acetaminophen (TYLENOL) 500 MG tablet, Take 500 mg by mouth as needed., Disp: , Rfl:  .  cetirizine (ZYRTEC ALLERGY) 10 MG tablet, Take 1 tablet (10 mg total) by mouth daily., Disp: 30 tablet, Rfl: 0 .  predniSONE (DELTASONE) 20 MG tablet, Take 2 tablets daily with breakfast., Disp: 10 tablet, Rfl: 0    Physical Exam: There were no vitals taken for this visit.   Affect appropriate Healthy:  appears stated age 69: normal Neck supple with no adenopathy JVP normal no bruits no thyromegaly Lungs clear with no wheezing and good diaphragmatic motion Heart:  S1/S2 no murmur, no rub, gallop or click PMI normal Abdomen: benighn, BS positve, no tenderness, no AAA no bruit.  No HSM  or HJR Distal pulses intact with no bruits No edema Neuro non-focal Skin warm and dry No muscular weakness   Labs:   Lab Results  Component Value Date   WBC 3.0 (L) 06/16/2019   HGB 11.8 (L) 06/16/2019   HCT 38.2 06/16/2019   MCV 94.8 06/16/2019   PLT 316 06/16/2019   No results for input(s): NA, K, CL, CO2, BUN, CREATININE, CALCIUM, PROT, BILITOT, ALKPHOS, ALT, AST, GLUCOSE in the last 168 hours.  Invalid input(s): LABALBU Lab Results  Component Value Date   TROPONINI <0.01 03/22/2017    Lab Results  Component Value Date   CHOL 111 05/22/2019   CHOL 149 10/25/2018   Lab Results  Component Value Date   HDL 38 (L) 05/22/2019   HDL 51 10/25/2018   Lab Results  Component Value Date   LDLCALC 56 05/22/2019   LDLCALC 82 10/25/2018   Lab Results  Component Value Date   TRIG 85 05/22/2019   TRIG 87 10/25/2018   Lab Results  Component Value Date   CHOLHDL 2.9 05/22/2019   CHOLHDL 2.9 10/25/2018   No results found for: LDLDIRECT    Radiology: No results found.  EKG: SR PR 208 msec poor R wave progression   ASSESSMENT AND PLAN:   1. Chest pain:  Non cardiac normal cath 05/24/19 noted false positive myovue 2. HTN:  Well controlled.  Continue current medications and low sodium Dash type diet.   3. Palpitations benign continue low dose beta blocker Zio no significant arrhythmias 4. Neutropenia:  F/u blood counts with primary   Signed: Jenkins Rouge 01/16/2020, 9:05 AM

## 2020-01-21 ENCOUNTER — Ambulatory Visit: Payer: Medicare Other | Admitting: Cardiovascular Disease

## 2020-03-04 NOTE — Progress Notes (Signed)
Cardiology Office Note   Date:  03/12/2020   ID:  Brandy Stewart, [name] 11/30/1950, MRN [id number]  PCP:  [facility]  Cardiologist: Dr. Johnsie Cancel, MD   No chief complaint on file.     History of Present Illness: Brandy Stewart is a 70 y.o. female  has a history of ITP/neutropenia,  history of COVID-19 for which she was hospitalized at [facility] and discharged 05/14/2019. 5 days after discharge,.  Nuclear study was performed which was found to be intermediate therefore cardiac cath was planned.  Echocardiogram showed an EF of 50 to 55% with no regional wall motion abnormalities.  LHC performed 05/24/2019 which showed normal coronary arteries however torturous with hypertensive heart disease.  Has had bradycardia with beta blocker dose reduced to 12.5 mg bid  Then d/c Zio monitor showed SR with average HR 85 bpm infrequent PAC;s and no AV block 08/02/19   One episode last Sunday palpitations with perceived rapid beating Only lasted 5 minutes     Past Medical History:  Diagnosis Date  . Acute respiratory failure with hypoxia ([place])   . Bradycardia   . Chest pain   . COVID-19   . History of ITP   . HTN (hypertension)   . Leukemia ([place])   . Pneumonia     Past Surgical History:  Procedure Laterality Date  . LEFT HEART CATH AND CORONARY ANGIOGRAPHY N/A 05/24/2019   Procedure: LEFT HEART CATH AND CORONARY ANGIOGRAPHY;  Surgeon: Leonie Man, MD;  Location: [place] [facility];  Service: Cardiovascular;  Laterality: N/A;  . PARTIAL HYSTERECTOMY       Current Outpatient Medications  Medication Sig Dispense Refill  . acetaminophen (TYLENOL) 500 MG tablet Take 500 mg by mouth as needed.     No current facility-administered medications for this visit.    Allergies:   Penicillins    Social History:  The patient  reports that she has never smoked. She has never used smokeless tobacco. She reports that she does not drink alcohol and does not use drugs.   Family  History:  The patient's family history includes Diabetes in her father and mother; Hypertension in her father and mother.    ROS:  Please see the history of present illness.   Otherwise, review of systems are positive for none.   All other systems are reviewed and negative.    PHYSICAL EXAM: VS:  BP 104/62   Pulse 97   Ht 5\' 4"  (1.626 m)   Wt 105.7 kg   SpO2 99%   BMI 39.99 kg/m  , BMI Body mass index is 39.99 kg/m.   Affect appropriate Healthy:  appears stated age 50: normal Neck supple with no adenopathy JVP normal no bruits no thyromegaly Lungs clear with no wheezing and good diaphragmatic motion Heart:  S1/S2 no murmur, no rub, gallop or click PMI normal Abdomen: benighn, BS positve, no tenderness, no AAA no bruit.  No HSM or HJR Distal pulses intact with no bruits No edema Neuro non-focal Skin warm and dry No muscular weakness .     EKG:   06/20/19 NSR rate 78 poor R wave progression   Recent Labs: 05/22/2019: ALT 26; TSH 1.975 06/16/2019: BUN 7; Creatinine, Ser 0.83; Hemoglobin 11.8; Platelets 316; Potassium 4.0; Sodium 139   Lipid Panel    Component Value Date/Time   CHOL 111 05/22/2019 0427   CHOL 149 10/25/2018 0934   TRIG 85 05/22/2019 0427   HDL 38 (L) 05/22/2019  0427   HDL 51 10/25/2018 0934   CHOLHDL 2.9 05/22/2019 0427   VLDL 17 05/22/2019 0427   LDLCALC 56 05/22/2019 0427   LDLCALC 82 10/25/2018 0934      Wt Readings from Last 3 Encounters:  03/12/20 105.7 kg  09/30/19 101.2 kg  07/20/19 100.7 kg     Other studies Reviewed: Additional studies/ records that were reviewed today include:  Review of the above records demonstrates:  Cardiac cath 05/24/19  Angiographically normal, tortuous coronary arteries  LV end diastolic pressure is normal.  There is no aortic valve stenosis.   Angiographically normal, tortuous coronary arteries consistent with hypertensive heart disease.  False positive nuclear stress test  Normal  LVEDP   Echo 05/22/19 IMPRESSIONS    1. Left ventricular ejection fraction, by estimation, is 50 to 55%. The  left ventricle has low normal function. The left ventricle has no regional  wall motion abnormalities. Left ventricular diastolic parameters are  consistent with Grade I diastolic  dysfunction (impaired relaxation).  2. Right ventricular systolic function is normal. The right ventricular  size is normal.  3. The mitral valve is normal in structure. No evidence of mitral valve  regurgitation. No evidence of mitral stenosis.  4. The aortic valve is normal in structure. Aortic valve regurgitation is  not visualized. No aortic stenosis is present.  5. The inferior vena cava is normal in size with greater than 50%  respiratory variability, suggesting right atrial pressure of 3 mmHg.   ASSESSMENT AND PLAN:  1.  Chest pain: -false positive myovue No CAD at cath 05/24/19   2.  Bradycardia: -resolved off beta blocker   3.  Tachycardia/palpitations: -benign monitor 08/02/19  - PRN inderal 10 mg called in   4.  HTN: -Well controlled.  Continue current medications and low sodium Dash type diet.      5.  History of ITP/leukemia: -Followed by primary  6. S/p COVID-19: -Diagnosed 04/2019 at [facility]   Current medicines are reviewed at length with the patient today.  The patient does not have concerns regarding medicines.  The following changes have been made:  no change  Labs/ tests ordered today include: None  No orders of the defined types were placed in this encounter.    Disposition:   FU in a year   Signed, Jenkins Rouge, MD  03/12/2020 9:10 AM    [facility] [address], [place], [place]  [address] Phone: [phone]; Fax: [phone]

## 2020-03-12 ENCOUNTER — Other Ambulatory Visit: Payer: Self-pay

## 2020-03-12 ENCOUNTER — Ambulatory Visit (INDEPENDENT_AMBULATORY_CARE_PROVIDER_SITE_OTHER): Payer: Medicare Other | Admitting: Cardiovascular Disease

## 2020-03-12 ENCOUNTER — Encounter: Payer: Self-pay | Admitting: Cardiovascular Disease

## 2020-03-12 VITALS — BP 104/62 | HR 97 | Ht 64.0 in | Wt 233.0 lb

## 2020-03-12 DIAGNOSIS — R002 Palpitations: Secondary | ICD-10-CM | POA: Diagnosis not present

## 2020-03-12 MED ORDER — PROPRANOLOL HCL 10 MG PO TABS: 10.0000 mg | ORAL_TABLET | Freq: Every day | ORAL | 11 refills | Status: DC | PRN

## 2020-03-12 NOTE — Patient Instructions (Signed)
Medication Instructions:  Your physician has recommended you make the following change in your medication:  1-START Inderal 10 mg by mouth daily as needed for palpitations.  Your physician recommends that you continue on your current medications as directed. Please refer to the Current Medication list given to you today.  Labwork: NONE  Testing/Procedures: NONE  Follow-Up: Your physician wants you to follow-up in: 12 months with Dr. Johnsie Cancel. You will receive a reminder letter in the mail two months in advance. If you don't receive a letter, please call our office to schedule the follow-up appointment.   If you need a refill on your cardiac medications before your next appointment, please call your pharmacy.

## 2020-03-12 NOTE — Addendum Note (Signed)
Addended by: Aris Georgia, Gianfranco Araki L on: 03/12/2020 09:23 AM   Modules accepted: Orders

## 2021-06-13 IMAGING — DX DG LUMBAR SPINE COMPLETE 4+V
5 series · 5 of 5 positions shown · non-contrast
Comparison: 04/14/2015 and 05/05/2015

CLINICAL DATA: LOWER back pain radiating into legs.

EXAM:
LUMBAR SPINE - COMPLETE 4+ VIEW

[l-spine ap]
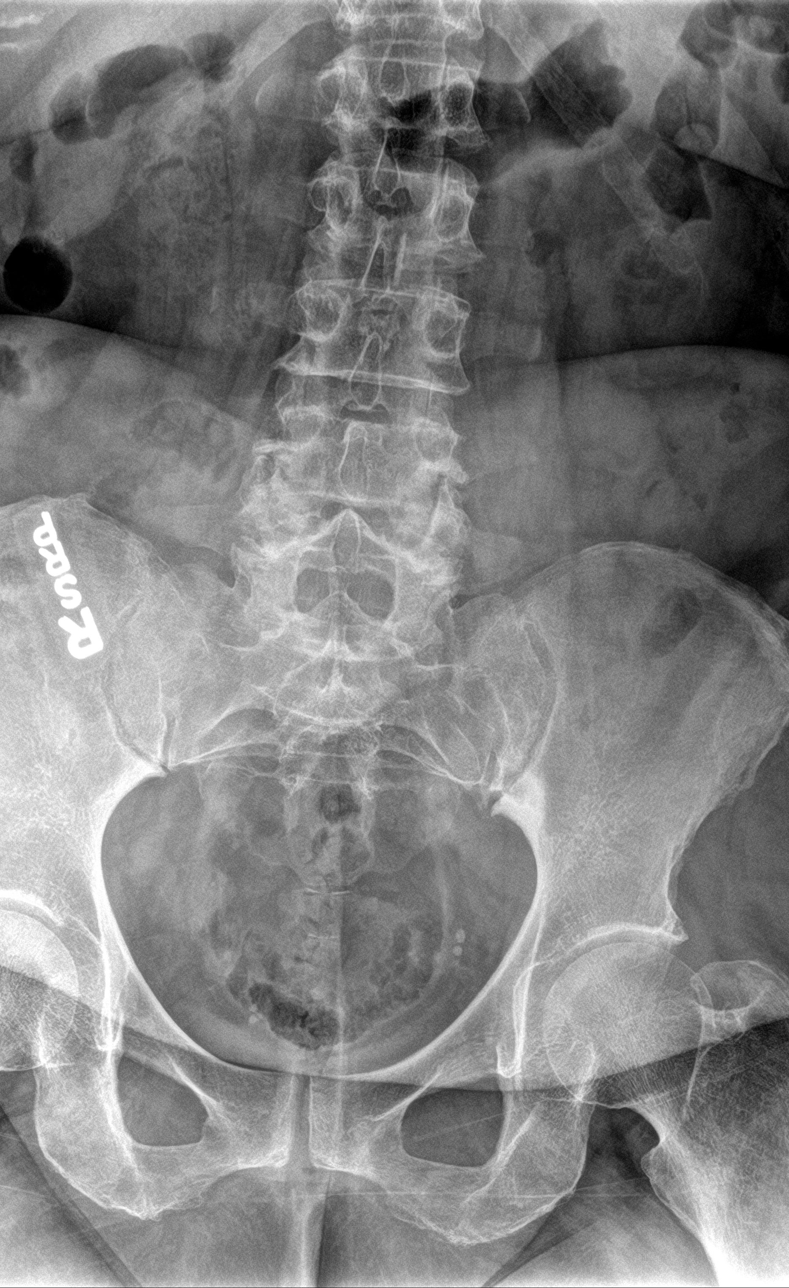

[l-spine obl (1 of 2)]
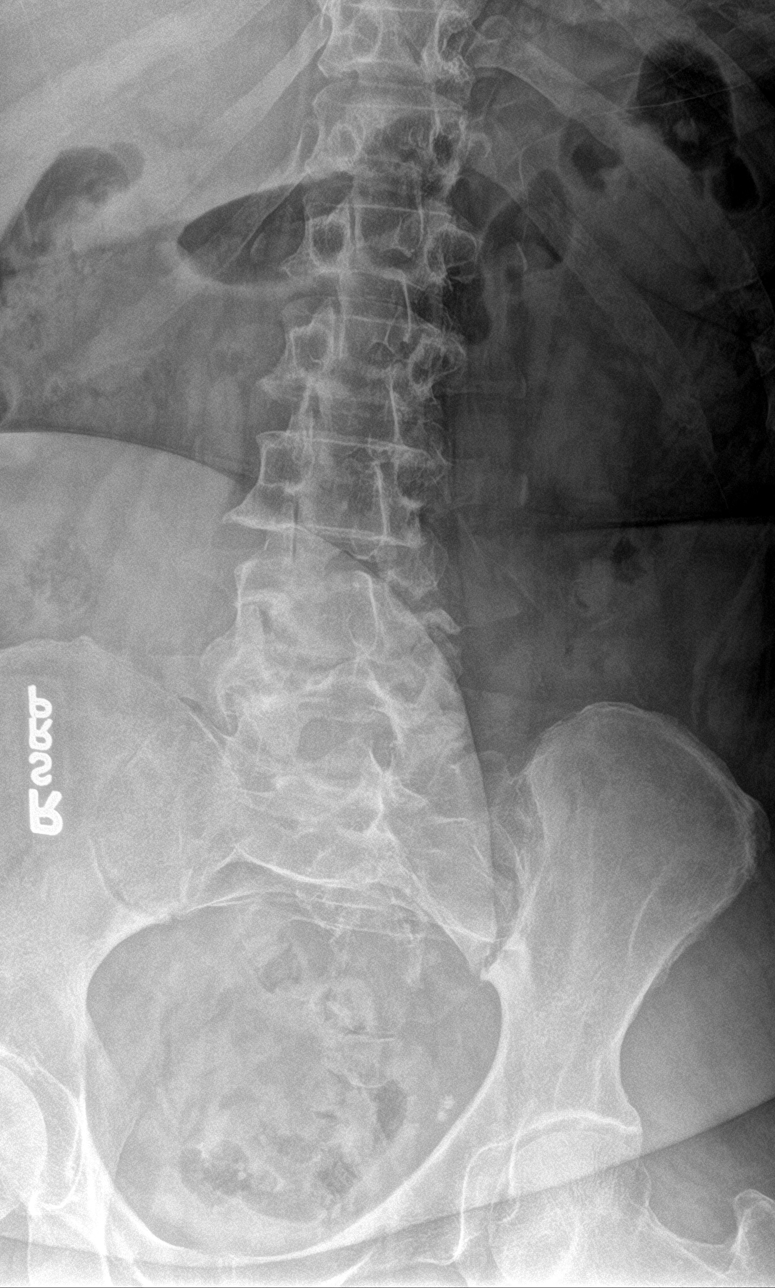

[l-spine obl (2 of 2)]
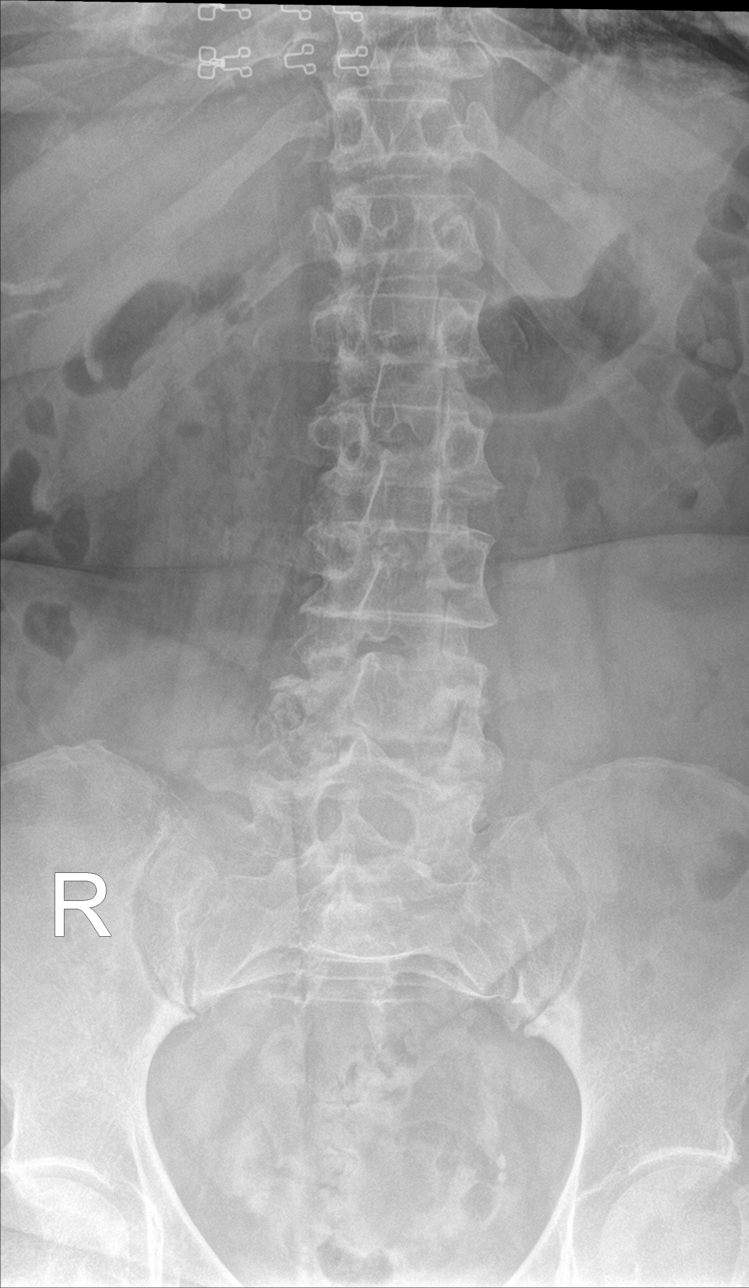

[l-spine lat]
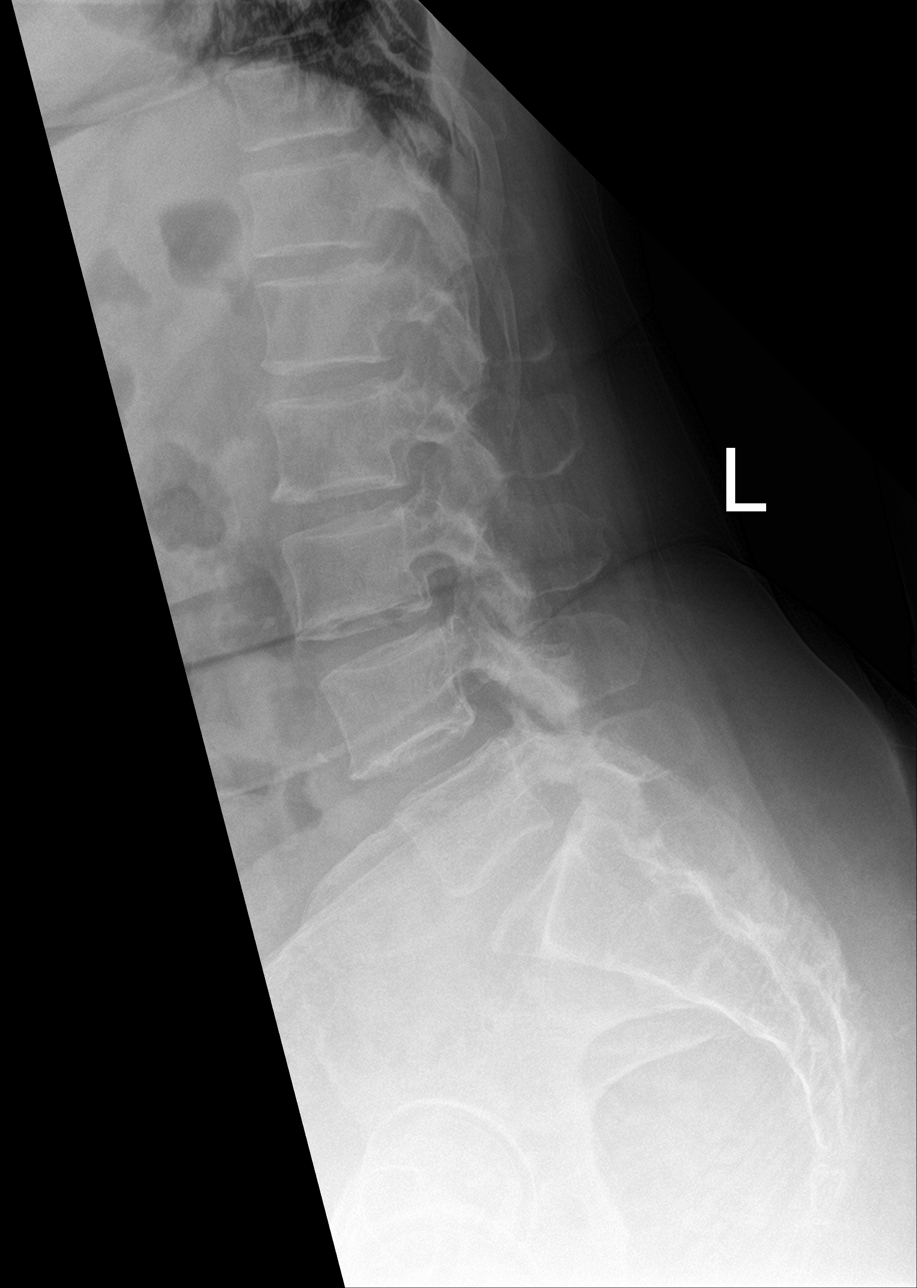

[l-spine spot]
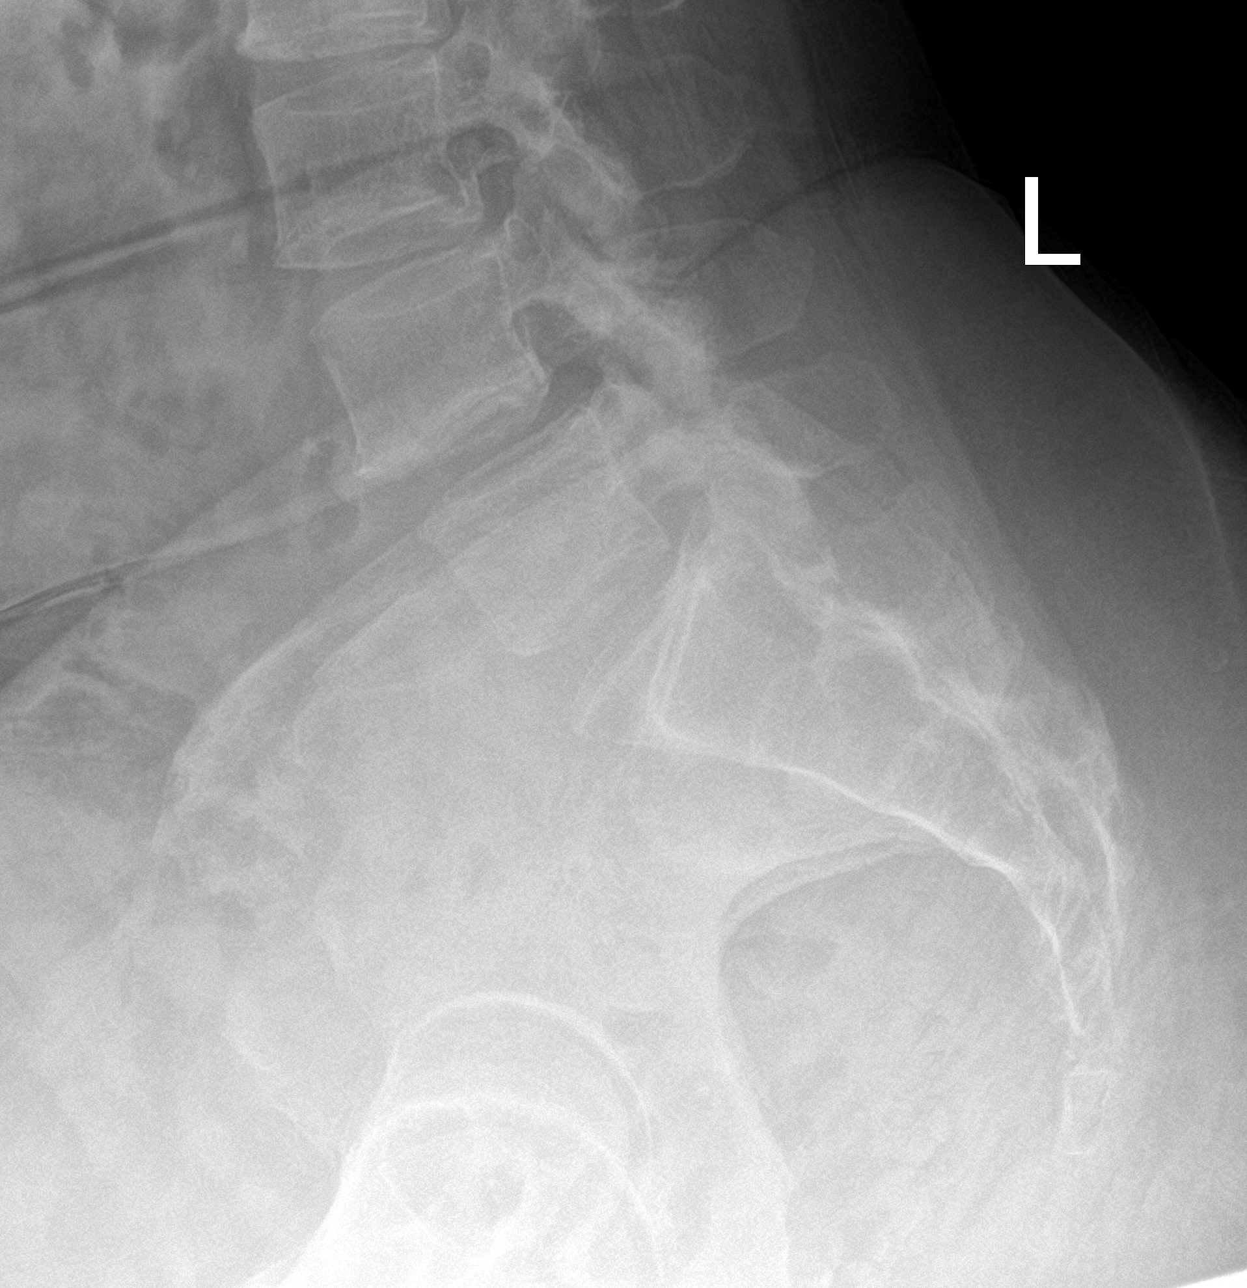

[5 of 5 positions shown; findings below may reference images not displayed]

FINDINGS: Degenerative changes are present in the LOWER lumbar spine with
facet hypertrophy primarily at L3-4, L4-5, L5-S1. There is 5
millimeters anterolisthesis of L4 on L5. No acute fracture. No lytic
or blastic lesions. Visualized bowel gas pattern is nonobstructed.
IMPRESSION: 1. Grade 1 anterolisthesis of L4 on L5.
2. No evidence for acute abnormality.

## 2022-10-09 ENCOUNTER — Other Ambulatory Visit: Payer: Self-pay

## 2022-10-09 ENCOUNTER — Emergency Department (HOSPITAL_COMMUNITY)
Admission: EM | Admit: 2022-10-09 | Discharge: 2022-10-10 | Disposition: A | Discharge status: Home / Self Care | Payer: Medicare Other | Attending: Emergency Medicine | Admitting: Emergency Medicine

## 2022-10-09 ENCOUNTER — Emergency Department (HOSPITAL_COMMUNITY): Payer: Medicare Other

## 2022-10-09 ENCOUNTER — Encounter (HOSPITAL_COMMUNITY): Payer: Self-pay

## 2022-10-09 DIAGNOSIS — R000 Tachycardia, unspecified: Secondary | ICD-10-CM | POA: Diagnosis not present

## 2022-10-09 DIAGNOSIS — Z79899 Other long term (current) drug therapy: Secondary | ICD-10-CM | POA: Insufficient documentation

## 2022-10-09 DIAGNOSIS — I10 Essential (primary) hypertension: Secondary | ICD-10-CM | POA: Diagnosis not present

## 2022-10-09 DIAGNOSIS — R002 Palpitations: Secondary | ICD-10-CM | POA: Diagnosis present

## 2022-10-09 NOTE — ED Triage Notes (Signed)
Pt to ED by EMS from home with c/o palpitations and weakness which began this evening. Per EMS pt had an initial HR of 120 but has been in NSR since then. Arrives A+O, VSS, NADN.

## 2022-10-10 DIAGNOSIS — R002 Palpitations: Secondary | ICD-10-CM | POA: Diagnosis not present

## 2022-10-10 LAB — TSH: TSH: 3.85 u[IU]/mL (ref 0.350–4.500)

## 2022-10-10 LAB — CBC
HCT: 39.6 % (ref 36.0–46.0)
Hemoglobin: 12.7 g/dL (ref 12.0–15.0)
MCH: 30.1 pg (ref 26.0–34.0)
MCHC: 32.1 g/dL (ref 30.0–36.0)
MCV: 93.8 fL (ref 80.0–100.0)
Platelets: 261 10*3/uL (ref 150–400)
RBC: 4.22 MIL/uL (ref 3.87–5.11)
RDW: 13.2 % (ref 11.5–15.5)
WBC: 6.4 10*3/uL (ref 4.0–10.5)
nRBC: 0 % (ref 0.0–0.2)

## 2022-10-10 LAB — BASIC METABOLIC PANEL
Anion gap: 12 (ref 5–15)
BUN: 12 mg/dL (ref 8–23)
CO2: 26 mmol/L (ref 22–32)
Calcium: 9.3 mg/dL (ref 8.9–10.3)
Chloride: 100 mmol/L (ref 98–111)
Creatinine, Ser: 0.89 mg/dL (ref 0.44–1.00)
GFR, Estimated: >60 mL/min (ref >60)
Glucose, Bld: 123 mg/dL — ABNORMAL HIGH (ref 70–99)
Potassium: 4.5 mmol/L (ref 3.5–5.1)
Sodium: 138 mmol/L (ref 135–145)

## 2022-10-10 LAB — RAPID URINE DRUG SCREEN, HOSP PERFORMED
Amphetamines: NOT DETECTED
Barbiturates: NOT DETECTED
Benzodiazepines: NOT DETECTED
Cocaine: NOT DETECTED
Opiates: NOT DETECTED
Tetrahydrocannabinol: NOT DETECTED

## 2022-10-10 LAB — TROPONIN I (HIGH SENSITIVITY)
Troponin I (High Sensitivity): 14 ng/L (ref <18)
Troponin I (High Sensitivity): 7 ng/L (ref <18)

## 2022-10-10 LAB — D-DIMER, QUANTITATIVE: D-Dimer, Quant: 0.37 ug{FEU}/mL (ref 0.00–0.50)

## 2022-10-10 LAB — MAGNESIUM: Magnesium: 1.9 mg/dL (ref 1.7–2.4)

## 2022-10-10 NOTE — Discharge Instructions (Addendum)
Your workup in the emergency department has been reassuring and did not reveal a concerning cause of your palpitations.  Should they recur, you may benefit from outpatient heart monitoring.  This can often be coordinated through your primary doctor.  Return to the ED for new or concerning symptoms.

## 2022-10-10 NOTE — ED Provider Notes (Signed)
[place] EMERGENCY DEPARTMENT AT [facility] Provider Note   CSN: [id number] Arrival date & time: 10/09/22  2327     History  Chief Complaint  Patient presents with   Tachycardia    Brandy Stewart is a 72 y.o. female.  72 year old female with a history of hypertension presents to the emergency department for evaluation of palpitations.  She states that she had an episode of palpitations this evening characterized by a rapid heart rate.  She felt as though her heart was beating fast, though this only lasted 2 to 3 minutes before improving.  Currently she reports feeling back to her baseline.  EMS reports initial heart rate of 120 bpm which improved to normal sinus rhythm by the time of ED arrival.  Patient denies any recent fevers, viral illness or associated chest pain, syncope/near syncope, vomiting.  No recent medication changes, the use of stimulants or excessive caffeine.  Denies a history of anxiety.  The history is provided by the patient. No language interpreter was used.       Home Medications Prior to Admission medications   Medication Sig Start Date End Date Taking? Authorizing Provider  acetaminophen (TYLENOL) 500 MG tablet Take 500 mg by mouth as needed.    [provider]  propranolol (INDERAL) 10 MG tablet Take 1 tablet (10 mg total) by mouth daily as needed (palpitations). 03/12/20   Wendall Stade, MD      Allergies    Penicillins    Review of Systems   Review of Systems Ten systems reviewed and are negative for acute change, except as noted in the HPI.    Physical Exam Updated Vital Signs BP (!) 123/59   Pulse 63   Temp 98.3 F (36.8 C) (Oral)   Resp 19   SpO2 100%   Physical Exam Vitals and nursing note reviewed.  Constitutional:      General: She is not in acute distress.    Appearance: She is well-developed. She is not diaphoretic.     Comments: Nontoxic appearing and in NAD  HENT:     Head: Normocephalic and atraumatic.   Eyes:     General: No scleral icterus.    Extraocular Movements: EOM normal.     Conjunctiva/sclera: Conjunctivae normal.  Cardiovascular:     Rate and Rhythm: Normal rate and regular rhythm.     Pulses: Normal pulses.  Pulmonary:     Effort: Pulmonary effort is normal. No respiratory distress.     Breath sounds: No stridor. No wheezing.     Comments: Lungs CTAB. Respirations even and unlabored. Musculoskeletal:        General: Normal range of motion.     Cervical back: Normal range of motion.     Comments: No pitting edema BLE  Skin:    General: Skin is warm and dry.     Coloration: Skin is not pale.     Findings: No erythema or rash.  Neurological:     Mental Status: She is alert and oriented to person, place, and time.     Coordination: Coordination normal.     Comments: Ambulatory with steady gait.  Psychiatric:        Mood and Affect: Mood and affect normal.        Behavior: Behavior normal.     ED Results / Procedures / Treatments   Labs (all labs ordered are listed, but only abnormal results are displayed) Labs Reviewed  BASIC METABOLIC PANEL - Abnormal; Notable  for the following components:      Result Value   Glucose, Bld 123 (*)    All other components within normal limits  CBC  TSH  RAPID URINE DRUG SCREEN, HOSP PERFORMED  MAGNESIUM  D-DIMER, QUANTITATIVE  TROPONIN I (HIGH SENSITIVITY)  TROPONIN I (HIGH SENSITIVITY)    EKG EKG Interpretation Date/Time:  Saturday October 09 2022 23:26:05 EDT Ventricular Rate:  120 PR Interval:  176 QRS Duration:  64 QT Interval:  294 QTC Calculation: 415 R Axis:   53  Text Interpretation: Sinus tachycardia Nonspecific T wave abnormality Abnormal ECG When compared with ECG of 16-Jun-2019 10:15, PREVIOUS ECG IS PRESENT Rate faster Confirmed by Glynn Octave [phone]) on 10/10/2022 3:53:40 AM   EKG Interpretation Date/Time:  Sunday October 10 2022 04:07:28 EDT Ventricular Rate:  66 PR Interval:  234 QRS  Duration:  105 QT Interval:  401 QTC Calculation: 421 R Axis:   41  Text Interpretation: Sinus rhythm Prolonged PR interval RSR' in V1 or V2, probably normal variant No significant change was found Confirmed by Glynn Octave [id number]) on 10/10/2022 4:10:25 AM        Radiology DG Chest 2 View  Result Date: 10/10/2022 CLINICAL DATA:  Chest pain EXAM: CHEST - 2 VIEW COMPARISON:  05/11/2022 FINDINGS: The heart size and mediastinal contours are within normal limits. Both lungs are clear. The visualized skeletal structures are unremarkable. IMPRESSION: No active cardiopulmonary disease. Electronically Signed   By: Charlett Nose M.D.   On: 10/10/2022 00:11    Procedures Procedures    Medications Ordered in ED Medications - No data to display  ED Course/ Medical Decision Making/ A&P Clinical Course as of 10/10/22 0529  Sun Oct 10, 2022  3086 Per chart review, normal cardiac catheterization in April 2021. Also with reassuring Holter monitor in June 2021. [KH]    Clinical Course User Index [KH] Antony Madura, PA-C                                 Medical Decision Making Amount and/or Complexity of Data Reviewed Labs: ordered. Radiology: ordered.   This patient presents to the ED for concern of palpitations, this involves an extensive number of treatment options, and is a complaint that carries with it a high risk of complications and morbidity.  The differential diagnosis includes anxiety vs substance use vs arrhythmia vs thyroid dysfunction vs PE   Co morbidities that complicate the patient evaluation  HTN   Additional history obtained:  Additional history obtained from family, at bedside   Lab Tests:  I Ordered, and personally interpreted labs.  The pertinent results include:  Normal CBC, BMP, Magnesium, TSH, D dimer. Negative troponin x2. Negative UDS.   Imaging Studies ordered:  I ordered imaging studies including CXR  I independently visualized and interpreted  imaging which showed no acute cardiopulmonary abnormality.  I agree with the radiologist interpretation   Cardiac Monitoring:  The patient was maintained on a cardiac monitor.  I personally viewed and interpreted the cardiac monitored which showed an underlying rhythm of: Sinus tachycardia > NSR   Medicines ordered and prescription drug management:  I have reviewed the patients home medicines and have made adjustments as needed   Test Considered:  Free T4   Problem List / ED Course:  72 year old female presenting to the emergency department for evaluation of palpitations.  Her symptoms were fleeting, lasting only a few minutes.  She  is asymptomatic on my assessment. Initially with sinus tachycardia on arrival.  This has defervesced and patient has maintained normal sinus rhythm otherwise.  Her laboratory evaluation is reassuring and her EKG does not show signs of acute ischemia.  Troponins reassuring.  Also normal thyroid function.  D-dimer negative excluding pulmonary embolus. Patient may benefit from outpatient Holter monitoring should her symptoms persist.  Otherwise stable for follow-up with her primary doctor.   Reevaluation:  After the interventions noted above, I reevaluated the patient and found that they have :resolved   Social Determinants of Health:  Good social support   Dispostion:  After consideration of the diagnostic results and the patients response to treatment, I feel that the patent would benefit from outpatient PCP f/u for recheck. Return precautions discussed and provided. Patient discharged in stable condition with no unaddressed concerns.          Final Clinical Impression(s) / ED Diagnoses Final diagnoses:  Palpitations    Rx / DC Orders ED Discharge Orders     None         Antony Madura, PA-C 10/10/22 7829    Glynn Octave, MD 10/11/22 [phone]

## 2022-12-11 ENCOUNTER — Emergency Department (HOSPITAL_COMMUNITY)
Admission: EM | Admit: 2022-12-11 | Discharge: 2022-12-12 | Disposition: A | Discharge status: Home / Self Care | Payer: 59 | Attending: Emergency Medicine | Admitting: Emergency Medicine

## 2022-12-11 ENCOUNTER — Encounter (HOSPITAL_COMMUNITY): Payer: Self-pay | Admitting: Emergency Medicine

## 2022-12-11 DIAGNOSIS — I10 Essential (primary) hypertension: Secondary | ICD-10-CM | POA: Diagnosis not present

## 2022-12-11 DIAGNOSIS — R002 Palpitations: Secondary | ICD-10-CM | POA: Diagnosis present

## 2022-12-11 DIAGNOSIS — Z79899 Other long term (current) drug therapy: Secondary | ICD-10-CM | POA: Diagnosis not present

## 2022-12-11 LAB — CBC WITH DIFFERENTIAL/PLATELET
Abs Immature Granulocytes: 0.01 10*3/uL (ref 0.00–0.07)
Basophils Absolute: 0 10*3/uL (ref 0.0–0.1)
Basophils Relative: 0 %
Eosinophils Absolute: 0 10*3/uL (ref 0.0–0.5)
Eosinophils Relative: 1 %
HCT: 41.3 % (ref 36.0–46.0)
Hemoglobin: 13.1 g/dL (ref 12.0–15.0)
Immature Granulocytes: 0 %
Lymphocytes Relative: 41 %
Lymphs Abs: 2.9 10*3/uL (ref 0.7–4.0)
MCH: 30.3 pg (ref 26.0–34.0)
MCHC: 31.7 g/dL (ref 30.0–36.0)
MCV: 95.4 fL (ref 80.0–100.0)
Monocytes Absolute: 0.6 10*3/uL (ref 0.1–1.0)
Monocytes Relative: 9 %
Neutro Abs: 3.4 10*3/uL (ref 1.7–7.7)
Neutrophils Relative %: 49 %
Platelets: 186 10*3/uL (ref 150–400)
RBC: 4.33 MIL/uL (ref 3.87–5.11)
RDW: 13.2 % (ref 11.5–15.5)
WBC: 7 10*3/uL (ref 4.0–10.5)
nRBC: 0 % (ref 0.0–0.2)

## 2022-12-11 LAB — TROPONIN I (HIGH SENSITIVITY): Troponin I (High Sensitivity): 9 ng/L (ref <18)

## 2022-12-11 NOTE — ED Notes (Addendum)
Pt. Up to restroom with steady even gait. Denies palpitations at this time

## 2022-12-11 NOTE — Discharge Instructions (Signed)
You have normal lab work today.  We have sent another referral to cardiology so hopefully they can see you sooner  Return to ER if you have worse palpitations or chest pain

## 2022-12-11 NOTE — ED Provider Notes (Signed)
[place] EMERGENCY DEPARTMENT AT [facility] Provider Note   CSN: [id number] Arrival date & time: 12/11/22  2121     History  Chief Complaint  Patient presents with   Palpitations    Brandy Stewart is a 72 y.o. female history of hypertension here presenting with palpitations.  Patient heart palpitation earlier.  It lasted about 10 minutes.  She laid down and rested and her heart rate came down to normal.  Patient denies any chest pain.  Patient had similar symptoms back in August.  Patient came to the ER and had unremarkable workup.  Patient then had a Holter monitor placed by PCP.  Holter monitor showed the following "Patient had a min HR of 45 bpm, max HR of 156 bpm, and avg HR of 79 bpm. Predominant underlying rhythm was Sinus Rhythm. First Degree AV Block was present. 1 run of Ventricular Tachycardia occurred lasting 4 beats with a max rate of 156 bpm avg 126 bpm . Isolated SVEs were rare <1.0% , SVE Couplets were rare <1.0% , and SVE Triplets were rare <1.0% . Isolated VEs were rare <1.0% , VE Couplets were rare <1.0% , and no VE Triplets were present. Ventricular Trigeminy was present."  PCP called the patient and told patient to follow-up with her cardiologist, Dr. Eden Emms.  She has appointment in 2 weeks.    The history is provided by the patient.       Home Medications Prior to Admission medications   Medication Sig Start Date End Date Taking? Authorizing Provider  acetaminophen (TYLENOL) 500 MG tablet Take 500 mg by mouth as needed.    [provider]  propranolol (INDERAL) 10 MG tablet Take 1 tablet (10 mg total) by mouth daily as needed (palpitations). 03/12/20   Wendall Stade, MD      Allergies    Penicillins    Review of Systems   Review of Systems  Cardiovascular:  Positive for palpitations.  All other systems reviewed and are negative.   Physical Exam Updated Vital Signs BP (!) 148/64   Pulse 70   Temp 98.1 F (36.7 C) (Oral)   Resp  (!) 22   SpO2 99%  Physical Exam Vitals and nursing note reviewed.  Constitutional:      Appearance: Normal appearance.  HENT:     Head: Normocephalic.     Nose: Nose normal.     Mouth/Throat:     Mouth: Mucous membranes are moist.  Eyes:     Extraocular Movements: Extraocular movements intact.     Pupils: Pupils are equal, round, and reactive to light.  Cardiovascular:     Rate and Rhythm: Normal rate and regular rhythm.     Pulses: Normal pulses.     Heart sounds: Normal heart sounds.  Pulmonary:     Effort: Pulmonary effort is normal.     Breath sounds: Normal breath sounds.  Abdominal:     General: Abdomen is flat.     Palpations: Abdomen is soft.  Musculoskeletal:        General: Normal range of motion.     Cervical back: Normal range of motion and neck supple.  Skin:    General: Skin is warm.     Capillary Refill: Capillary refill takes less than 2 seconds.  Neurological:     General: No focal deficit present.     Mental Status: She is alert and oriented to person, place, and time.  Psychiatric:  Mood and Affect: Mood normal.        Behavior: Behavior normal.     ED Results / Procedures / Treatments   Labs (all labs ordered are listed, but only abnormal results are displayed) Labs Reviewed  CBC WITH DIFFERENTIAL/PLATELET  COMPREHENSIVE METABOLIC PANEL  TROPONIN I (HIGH SENSITIVITY)    EKG EKG Interpretation Date/Time:  Saturday December 11 2022 21:40:03 EDT Ventricular Rate:  73 PR Interval:  202 QRS Duration:  83 QT Interval:  386 QTC Calculation: 426 R Axis:   47  Text Interpretation: Sinus rhythm No significant change since last tracing Confirmed by Richardean Canal [phone]) on 12/11/2022 10:00:43 PM  Radiology No results found.  Procedures Procedures    Medications Ordered in ED Medications - No data to display  ED Course/ Medical Decision Making/ A&P                                 Medical Decision Making [name] is a 72  y.o. female here presenting with palpitations.  This is a recurrent problem.  Patient was recently seen in the ED and also had a Holter monitor.  I discussed the Holter monitor results with the on-call cardiologist, Dr. Orson Aloe.  He states that the 3 beats of V. tach over 2 weeks is considered a normal finding and it is not sustained and does not require intervention right now.  He agreed with checking electrolytes and follow-up with cardiology outpatient.  11:43 PM Chemistry pending. Signed out to Dr. Wilkie Aye. Anticipate dc home with cardiology follow up   Amount and/or Complexity of Data Reviewed Labs: ordered.    Final Clinical Impression(s) / ED Diagnoses Final diagnoses:  None    Rx / DC Orders ED Discharge Orders     None         Charlynne Pander, MD 12/11/22 [phone]

## 2022-12-11 NOTE — ED Triage Notes (Signed)
Pt. Presents to the ED BIBA from home for c/o palpitations w/ associated nausea. Denies cardiac hx. Pt. Reports all symptoms are gone at this time

## 2022-12-12 DIAGNOSIS — R002 Palpitations: Secondary | ICD-10-CM | POA: Diagnosis not present

## 2022-12-12 LAB — I-STAT CHEM 8, ED
BUN: 8 mg/dL (ref 8–23)
Calcium, Ion: 1.06 mmol/L — ABNORMAL LOW (ref 1.15–1.40)
Chloride: 106 mmol/L (ref 98–111)
Creatinine, Ser: 0.6 mg/dL (ref 0.44–1.00)
Glucose, Bld: 109 mg/dL — ABNORMAL HIGH (ref 70–99)
HCT: 38 % (ref 36.0–46.0)
Hemoglobin: 12.9 g/dL (ref 12.0–15.0)
Potassium: 4.3 mmol/L (ref 3.5–5.1)
Sodium: 140 mmol/L (ref 135–145)
TCO2: 23 mmol/L (ref 22–32)

## 2022-12-12 LAB — COMPREHENSIVE METABOLIC PANEL
ALT: 16 U/L (ref 0–44)
AST: 24 U/L (ref 15–41)
Albumin: 3.2 g/dL — ABNORMAL LOW (ref 3.5–5.0)
Alkaline Phosphatase: 75 U/L (ref 38–126)
Anion gap: 10 (ref 5–15)
BUN: 8 mg/dL (ref 8–23)
CO2: 21 mmol/L — ABNORMAL LOW (ref 22–32)
Calcium: 8.6 mg/dL — ABNORMAL LOW (ref 8.9–10.3)
Chloride: 106 mmol/L (ref 98–111)
Creatinine, Ser: 0.65 mg/dL (ref 0.44–1.00)
GFR, Estimated: >60 mL/min (ref >60)
Glucose, Bld: 108 mg/dL — ABNORMAL HIGH (ref 70–99)
Potassium: 3.8 mmol/L (ref 3.5–5.1)
Sodium: 137 mmol/L (ref 135–145)
Total Bilirubin: 0.6 mg/dL (ref 0.3–1.2)
Total Protein: 6.9 g/dL (ref 6.5–8.1)

## 2022-12-28 ENCOUNTER — Ambulatory Visit: Payer: Medicare Other | Admitting: Internal Medicine

## 2023-01-03 ENCOUNTER — Ambulatory Visit: Payer: 59 | Attending: Cardiology | Admitting: Cardiology

## 2023-01-03 ENCOUNTER — Encounter: Payer: Self-pay | Admitting: Cardiology

## 2023-01-03 VITALS — BP 128/78 | HR 84 | Ht 64.0 in | Wt 220.8 lb

## 2023-01-03 DIAGNOSIS — R002 Palpitations: Secondary | ICD-10-CM | POA: Diagnosis not present

## 2023-01-03 MED ORDER — PROPRANOLOL HCL 10 MG PO TABS: 10.0000 mg | ORAL_TABLET | Freq: Every day | ORAL | 3 refills | Status: AC | PRN
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
## 2023-01-03 NOTE — Patient Instructions (Addendum)
Medication Instructions:  Your physician has recommended you make the following change in your medication:   ** Propranolol 10mg  -Take 1 tablet by mouth daily as needed for palpitations.  *If you need a refill on your cardiac medications before your next appointment, please call your pharmacy*   Lab Work: None ordered.  If you have labs (blood work) drawn today and your tests are completely normal, you will receive your results only by: MyChart Message (if you have MyChart) OR A paper copy in the mail If you have any lab test that is abnormal or we need to change your treatment, we will call you to review the results.   Testing/Procedures: Your physician has requested that you have an echocardiogram. Echocardiography is a painless test that uses sound waves to create images of your heart. It provides your doctor with information about the size and shape of your heart and how well your heart's chambers and valves are working. This procedure takes approximately one hour. There are no restrictions for this procedure. Please do NOT wear cologne, perfume, aftershave, or lotions (deodorant is allowed). Please arrive 15 minutes prior to your appointment time.  Please note: We ask at that you not bring children with you during ultrasound (echo/ vascular) testing. Due to room size and safety concerns, children are not allowed in the ultrasound rooms during exams. Our front office staff cannot provide observation of children in our lobby area while testing is being conducted. An adult accompanying a patient to their appointment will only be allowed in the ultrasound room at the discretion of the ultrasound technician under special circumstances. We apologize for any inconvenience.    Follow-Up: At [facility], you and your health needs are our priority.  As part of our continuing mission to provide you with exceptional heart care, we have created designated Provider Care Teams.  These Care  Teams include your primary Cardiologist (physician) and Advanced Practice Providers (APPs -  Physician Assistants and Nurse Practitioners) who all work together to provide you with the care you need, when you need it.  We recommend signing up for the patient portal called "MyChart".  Sign up information is provided on this After Visit Summary.  MyChart is used to connect with patients for Virtual Visits (Telemedicine).  Patients are able to view lab/test results, encounter notes, upcoming appointments, etc.  Non-urgent messages can be sent to your provider as well.   To learn more about what you can do with MyChart, go to [email or web address].    Your next appointment:   3 month(s)  Provider:   Perlie Gold, PA-C

## 2023-02-22 ENCOUNTER — Ambulatory Visit (HOSPITAL_COMMUNITY): Payer: 59

## 2023-03-03 ENCOUNTER — Ambulatory Visit: Payer: Self-pay | Admitting: Physician Assistant

## 2023-03-16 ENCOUNTER — Ambulatory Visit (HOSPITAL_COMMUNITY): Payer: 59 | Attending: Cardiology

## 2023-03-16 DIAGNOSIS — I5030 Unspecified diastolic (congestive) heart failure: Secondary | ICD-10-CM

## 2023-03-16 DIAGNOSIS — R002 Palpitations: Secondary | ICD-10-CM | POA: Diagnosis not present

## 2023-03-16 DIAGNOSIS — R079 Chest pain, unspecified: Secondary | ICD-10-CM | POA: Diagnosis not present

## 2023-03-16 DIAGNOSIS — I10 Essential (primary) hypertension: Secondary | ICD-10-CM | POA: Insufficient documentation

## 2023-03-16 DIAGNOSIS — I493 Ventricular premature depolarization: Secondary | ICD-10-CM | POA: Insufficient documentation

## 2023-03-16 LAB — ECHOCARDIOGRAM COMPLETE
Area-P 1/2: 3.75 cm2
S' Lateral: 2.8 cm

## 2023-04-07 ENCOUNTER — Ambulatory Visit: Payer: 59 | Admitting: Cardiology

## 2023-06-03 ENCOUNTER — Ambulatory Visit: Payer: 59 | Admitting: Cardiology

## 2023-06-22 ENCOUNTER — Encounter: Payer: Self-pay | Admitting: Cardiology

## 2023-06-22 ENCOUNTER — Ambulatory Visit: Attending: Cardiology | Admitting: Cardiology

## 2023-06-22 VITALS — BP 126/74 | HR 82 | Ht 64.0 in | Wt 222.0 lb

## 2023-06-22 DIAGNOSIS — Z862 Personal history of diseases of the blood and blood-forming organs and certain disorders involving the immune mechanism: Secondary | ICD-10-CM

## 2023-06-22 DIAGNOSIS — R002 Palpitations: Secondary | ICD-10-CM

## 2023-06-22 DIAGNOSIS — I10 Essential (primary) hypertension: Secondary | ICD-10-CM

## 2023-06-22 NOTE — Progress Notes (Signed)
 Cardiology Office Note:   Date:  06/22/2023  ID:  Andre Licht, DOB 04-Jan-1951, MRN [id number] PCP: [facility], [facility]  [facility] Providers Cardiologist:  Janelle Mediate, MD    History of Present Illness:   Discussed the use of AI scribe software for clinical note transcription with the patient, who gave verbal consent to proceed.  History of Present Illness Brandy Stewart is a 73 year old female with Idiopathic Thrombocytopenic Purpura (ITP), neutropenia, palpitations who presents for follow-up.  In 2021, she underwent a left heart catheterization which showed no CAD, and an echocardiogram revealed a normal left ventricular ejection fraction with no regional wall motion abnormalities. A heart monitor worn in 2021 showed a minimum heart rate of 45, a maximum of 156, and an average of 79, with a prominent underlying rhythm of sinus with first-degree AV block, one run of ventricular tachycardia lasting four beats, and ventricular trigeminy. A recent echocardiogram also showed a normal left ventricular ejection fraction with no significant valvular abnormalities.  She has experienced two notable episodes of palpitations in the past year. At her last visit with me, patient given propranolol  10 mg as needed. However, since this visit, has not had recurrence of palpitations/has not required propranolol . She attributes to decreased stress. No recent chest pain, shortness of breath, or leg swelling. Sleep quality is variable.  Her allergies have been bothersome, and she was prescribed Flonase . She has also started taking nortriptyline 10 mg for nerve pain in her feet, having previously tried gabapentin, which she could not tolerate.  Today patient denies chest pain, shortness of breath, lower extremity edema, fatigue, palpitations, melena, hematuria, hemoptysis, diaphoresis, weakness, presyncope, syncope, orthopnea, and PND.   Studies Reviewed:    03/16/23 TTE  IMPRESSIONS      1. Left ventricular ejection fraction, by estimation, is 55 to 60%. Left  ventricular ejection fraction by 3D volume is 55 %. The left ventricle has  normal function. The left ventricle has no regional wall motion  abnormalities. Left ventricular diastolic   parameters are consistent with Grade I diastolic dysfunction (impaired  relaxation). The average left ventricular global longitudinal strain is  -24.2 %. The global longitudinal strain is normal.   2. Right ventricular systolic function is normal. The right ventricular  size is normal. Tricuspid regurgitation signal is inadequate for assessing  PA pressure.   3. The mitral valve is normal in structure. Trivial mitral valve  regurgitation. No evidence of mitral stenosis.   4. The aortic valve is grossly normal. Aortic valve regurgitation is not  visualized. No aortic stenosis is present.   5. The inferior vena cava is normal in size with greater than 50%  respiratory variability, suggesting right atrial pressure of 3 mmHg.   6. Rhythm strip during this exam demonstrates premature ventricular  contractions.   FINDINGS   Left Ventricle: Left ventricular ejection fraction, by estimation, is 55  to 60%. Left ventricular ejection fraction by 3D volume is 55 %. The left  ventricle has normal function. The left ventricle has no regional wall  motion abnormalities. The average  left ventricular global longitudinal strain is -24.2 %. The global  longitudinal strain is normal. The left ventricular internal cavity size  was normal in size. There is no left ventricular hypertrophy. Left  ventricular diastolic parameters are consistent   with Grade I diastolic dysfunction (impaired relaxation).   Right Ventricle: The right ventricular size is normal. No increase in  right ventricular wall thickness. Right ventricular systolic  function is  normal. Tricuspid regurgitation signal is inadequate for assessing PA  pressure.   Left Atrium: Left  atrial size was normal in size.   Right Atrium: Right atrial size was normal in size.   Pericardium: There is no evidence of pericardial effusion.   Mitral Valve: The mitral valve is normal in structure. Trivial mitral  valve regurgitation. No evidence of mitral valve stenosis.   Tricuspid Valve: The tricuspid valve is normal in structure. Tricuspid  valve regurgitation is trivial. No evidence of tricuspid stenosis.   Aortic Valve: The aortic valve is grossly normal. Aortic valve  regurgitation is not visualized. No aortic stenosis is present.   Pulmonic Valve: The pulmonic valve was normal in structure. Pulmonic valve  regurgitation is trivial. No evidence of pulmonic stenosis.   Aorta: The aortic root is normal in size and structure.   Venous: The inferior vena cava is normal in size with greater than 50%  respiratory variability, suggesting right atrial pressure of 3 mmHg.   IAS/Shunts: No atrial level shunt detected by color flow Doppler.   EKG: Rhythm strip during this exam demonstrates premature ventricular  contractions.   Risk Assessment/Calculations:              Physical Exam:   VS:  BP 126/74   Pulse 82   Ht 5\' 4"  (1.626 m)   Wt 222 lb (100.7 kg)   SpO2 97%   BMI 38.11 kg/m    Wt Readings from Last 3 Encounters:  06/22/23 222 lb (100.7 kg)  01/03/23 220 lb 12.8 oz (100.2 kg)  03/12/20 233 lb (105.7 kg)     Physical Exam Vitals reviewed.  Constitutional:      Appearance: Normal appearance.  HENT:     Head: Normocephalic.     Nose: Nose normal.  Eyes:     Pupils: Pupils are equal, round, and reactive to light.  Cardiovascular:     Rate and Rhythm: Normal rate and regular rhythm.     Pulses: Normal pulses.     Heart sounds: Normal heart sounds. No murmur heard.    No friction rub. No gallop.  Pulmonary:     Effort: Pulmonary effort is normal.     Breath sounds: Normal breath sounds.  Abdominal:     General: Abdomen is flat.  Musculoskeletal:      Right lower leg: No edema.     Left lower leg: No edema.  Skin:    General: Skin is warm and dry.     Capillary Refill: Capillary refill takes less than 2 seconds.  Neurological:     General: No focal deficit present.     Mental Status: She is alert and oriented to person, place, and time.  Psychiatric:        Mood and Affect: Mood normal.        Behavior: Behavior normal.        Thought Content: Thought content normal.        Judgment: Judgment normal.     Physical Exam    ASSESSMENT AND PLAN:    Assessment & Plan Palpitations Palpitations have improved with no recent episodes reported. Propranolol  has not been needed. Previous heart monitor showed sinus rhythm with first degree AV block, one run of ventricular tachycardia, and ventricular trigeminy. Echocardiogram showed normal left ventricular ejection fraction with no significant valvular abnormalities. - Continue propranolol  10 mg as needed for palpitations. - Follow-up in one year unless new symptoms arise.  Hypertension BP well  controlled. No anti-hypertensive agents needed at this time.  Peripheral neuropathy Peripheral neuropathy managed with nortriptyline 10 mg per PCP. Previous trial of gabapentin was not tolerated.  Allergic rhinitis Allergic rhinitis with recent exacerbation, managed with Flonase  per PCP.  ITP/leukemia Continue to follow up with PCP.            Signed, Leala Prince, PA-C '

## 2023-06-22 NOTE — Patient Instructions (Signed)
 Medication Instructions:   Your physician recommends that you continue on your current medications as directed. Please refer to the Current Medication list given to you today.  *If you need a refill on your cardiac medications before your next appointment, please call your pharmacy*    Follow-Up: At [facility], you and your health needs are our priority.  As part of our continuing mission to provide you with exceptional heart care, our providers are all part of one team.  This team includes your primary Cardiologist (physician) and Advanced Practice Providers or APPs (Physician Assistants and Nurse Practitioners) who all work together to provide you with the care you need, when you need it.  Your next appointment:   1 year(s)  Provider:   Janelle Mediate, MD
